# Patient Record
Sex: Male | Born: 1935 | Race: Black or African American | Hispanic: No | Smoking: Never smoker
Health system: Southern US, Community
[De-identification: ages and names within clinical notes are randomized; demographics above are authoritative.]

## PROBLEM LIST (undated history)

## (undated) DIAGNOSIS — I2510 Atherosclerotic heart disease of native coronary artery without angina pectoris: Secondary | ICD-10-CM

## (undated) DIAGNOSIS — D329 Benign neoplasm of meninges, unspecified: Secondary | ICD-10-CM

## (undated) DIAGNOSIS — A048 Other specified bacterial intestinal infections: Secondary | ICD-10-CM

## (undated) DIAGNOSIS — B356 Tinea cruris: Secondary | ICD-10-CM

## (undated) DIAGNOSIS — K219 Gastro-esophageal reflux disease without esophagitis: Secondary | ICD-10-CM

## (undated) DIAGNOSIS — M47812 Spondylosis without myelopathy or radiculopathy, cervical region: Secondary | ICD-10-CM

## (undated) DIAGNOSIS — N289 Disorder of kidney and ureter, unspecified: Secondary | ICD-10-CM

## (undated) DIAGNOSIS — R55 Syncope and collapse: Secondary | ICD-10-CM

## (undated) DIAGNOSIS — E785 Hyperlipidemia, unspecified: Secondary | ICD-10-CM

## (undated) DIAGNOSIS — R001 Bradycardia, unspecified: Secondary | ICD-10-CM

## (undated) DIAGNOSIS — K8500 Idiopathic acute pancreatitis without necrosis or infection: Secondary | ICD-10-CM

## (undated) DIAGNOSIS — F1011 Alcohol abuse, in remission: Secondary | ICD-10-CM

## (undated) DIAGNOSIS — K5909 Other constipation: Secondary | ICD-10-CM

## (undated) DIAGNOSIS — I10 Essential (primary) hypertension: Secondary | ICD-10-CM

## (undated) DIAGNOSIS — H4010X0 Unspecified open-angle glaucoma, stage unspecified: Secondary | ICD-10-CM

## (undated) HISTORY — DX: Spondylosis without myelopathy or radiculopathy, cervical region: M47.812

## (undated) HISTORY — DX: Gastro-esophageal reflux disease without esophagitis: K21.9

## (undated) HISTORY — DX: Atherosclerotic heart disease of native coronary artery without angina pectoris: I25.10

## (undated) HISTORY — DX: Idiopathic acute pancreatitis without necrosis or infection: K85.00

## (undated) HISTORY — PX: PACEMAKER INSERTION: SHX728

## (undated) HISTORY — DX: Benign neoplasm of meninges, unspecified: D32.9

## (undated) HISTORY — DX: Disorder of kidney and ureter, unspecified: N28.9

## (undated) HISTORY — DX: Other specified bacterial intestinal infections: A04.8

## (undated) HISTORY — DX: Syncope and collapse: R55

## (undated) HISTORY — PX: CATARACT EXTRACTION: SUR2

## (undated) HISTORY — DX: Alcohol abuse, in remission: F10.11

## (undated) HISTORY — DX: Other constipation: K59.09

## (undated) HISTORY — DX: Hyperlipidemia, unspecified: E78.5

## (undated) HISTORY — DX: Tinea cruris: B35.6

## (undated) HISTORY — DX: Bradycardia, unspecified: R00.1

## (undated) HISTORY — DX: Essential (primary) hypertension: I10

## (undated) HISTORY — DX: Unspecified open-angle glaucoma, stage unspecified: H40.10X0

## (undated) HISTORY — PX: OTHER SURGICAL HISTORY: SHX169

---

## 1998-04-26 ENCOUNTER — Encounter: Admission: RE | Admit: 1998-04-26 | Discharge: 1998-04-26 | Payer: Self-pay | Admitting: Hematology and Oncology

## 1998-05-03 ENCOUNTER — Encounter: Admission: RE | Admit: 1998-05-03 | Discharge: 1998-05-03 | Payer: Self-pay | Admitting: Hematology and Oncology

## 1998-08-03 ENCOUNTER — Encounter: Admission: RE | Admit: 1998-08-03 | Discharge: 1998-08-03 | Payer: Self-pay | Admitting: Internal Medicine

## 1998-09-08 ENCOUNTER — Encounter: Admission: RE | Admit: 1998-09-08 | Discharge: 1998-09-08 | Payer: Self-pay | Admitting: Internal Medicine

## 1998-09-18 ENCOUNTER — Encounter: Admission: RE | Admit: 1998-09-18 | Discharge: 1998-12-17 | Payer: Self-pay | Admitting: *Deleted

## 1999-01-26 ENCOUNTER — Encounter: Admission: RE | Admit: 1999-01-26 | Discharge: 1999-01-26 | Payer: Self-pay | Admitting: Internal Medicine

## 1999-02-04 ENCOUNTER — Ambulatory Visit (HOSPITAL_COMMUNITY): Admission: RE | Admit: 1999-02-04 | Discharge: 1999-02-04 | Payer: Self-pay | Admitting: *Deleted

## 1999-02-04 ENCOUNTER — Encounter: Payer: Self-pay | Admitting: *Deleted

## 1999-03-13 ENCOUNTER — Encounter: Admission: RE | Admit: 1999-03-13 | Discharge: 1999-03-13 | Payer: Self-pay | Admitting: Internal Medicine

## 1999-08-29 ENCOUNTER — Encounter (INDEPENDENT_AMBULATORY_CARE_PROVIDER_SITE_OTHER): Payer: Self-pay | Admitting: Hospitalist

## 1999-08-29 LAB — CONVERTED CEMR LAB: PSA: 0.31 ng/mL

## 1999-09-14 ENCOUNTER — Encounter: Admission: RE | Admit: 1999-09-14 | Discharge: 1999-09-14 | Payer: Self-pay | Admitting: Internal Medicine

## 2000-01-18 ENCOUNTER — Encounter: Admission: RE | Admit: 2000-01-18 | Discharge: 2000-01-18 | Payer: Self-pay | Admitting: Internal Medicine

## 2000-04-08 ENCOUNTER — Encounter: Admission: RE | Admit: 2000-04-08 | Discharge: 2000-04-08 | Payer: Self-pay | Admitting: Hematology and Oncology

## 2000-05-14 ENCOUNTER — Encounter: Payer: Self-pay | Admitting: Emergency Medicine

## 2000-05-14 ENCOUNTER — Encounter: Payer: Self-pay | Admitting: Internal Medicine

## 2000-05-14 ENCOUNTER — Inpatient Hospital Stay (HOSPITAL_COMMUNITY): Admission: EM | Admit: 2000-05-14 | Discharge: 2000-05-16 | Payer: Self-pay | Admitting: Emergency Medicine

## 2000-05-15 ENCOUNTER — Encounter: Payer: Self-pay | Admitting: Internal Medicine

## 2000-05-29 ENCOUNTER — Encounter: Admission: RE | Admit: 2000-05-29 | Discharge: 2000-05-29 | Payer: Self-pay | Admitting: Internal Medicine

## 2000-10-13 ENCOUNTER — Emergency Department (HOSPITAL_COMMUNITY): Admission: EM | Admit: 2000-10-13 | Discharge: 2000-10-13 | Payer: Self-pay | Admitting: Emergency Medicine

## 2001-01-19 ENCOUNTER — Encounter: Admission: RE | Admit: 2001-01-19 | Discharge: 2001-01-19 | Payer: Self-pay | Admitting: Internal Medicine

## 2001-02-23 ENCOUNTER — Encounter: Admission: RE | Admit: 2001-02-23 | Discharge: 2001-02-23 | Payer: Self-pay | Admitting: Internal Medicine

## 2001-06-24 ENCOUNTER — Encounter: Admission: RE | Admit: 2001-06-24 | Discharge: 2001-06-24 | Payer: Self-pay | Admitting: Internal Medicine

## 2001-06-24 ENCOUNTER — Ambulatory Visit (HOSPITAL_COMMUNITY): Admission: RE | Admit: 2001-06-24 | Discharge: 2001-06-24 | Payer: Self-pay | Admitting: Internal Medicine

## 2001-07-08 ENCOUNTER — Ambulatory Visit (HOSPITAL_COMMUNITY): Admission: RE | Admit: 2001-07-08 | Discharge: 2001-07-08 | Payer: Self-pay | Admitting: Internal Medicine

## 2001-07-08 ENCOUNTER — Encounter: Admission: RE | Admit: 2001-07-08 | Discharge: 2001-07-08 | Payer: Self-pay | Admitting: Internal Medicine

## 2001-07-14 ENCOUNTER — Encounter: Admission: RE | Admit: 2001-07-14 | Discharge: 2001-07-14 | Payer: Self-pay | Admitting: Internal Medicine

## 2001-07-22 ENCOUNTER — Ambulatory Visit (HOSPITAL_COMMUNITY): Admission: RE | Admit: 2001-07-22 | Discharge: 2001-07-22 | Payer: Self-pay | Admitting: Internal Medicine

## 2001-07-28 ENCOUNTER — Encounter: Admission: RE | Admit: 2001-07-28 | Discharge: 2001-07-28 | Payer: Self-pay | Admitting: Internal Medicine

## 2001-07-28 ENCOUNTER — Ambulatory Visit (HOSPITAL_COMMUNITY): Admission: RE | Admit: 2001-07-28 | Discharge: 2001-07-28 | Payer: Self-pay | Admitting: Internal Medicine

## 2001-08-11 ENCOUNTER — Encounter: Admission: RE | Admit: 2001-08-11 | Discharge: 2001-08-11 | Payer: Self-pay | Admitting: Internal Medicine

## 2001-11-27 ENCOUNTER — Ambulatory Visit (HOSPITAL_COMMUNITY): Admission: RE | Admit: 2001-11-27 | Discharge: 2001-11-27 | Payer: Self-pay | Admitting: Internal Medicine

## 2001-11-27 ENCOUNTER — Encounter: Admission: RE | Admit: 2001-11-27 | Discharge: 2001-11-27 | Payer: Self-pay

## 2002-02-16 ENCOUNTER — Encounter: Admission: RE | Admit: 2002-02-16 | Discharge: 2002-02-16 | Payer: Self-pay | Admitting: Internal Medicine

## 2002-07-30 ENCOUNTER — Emergency Department (HOSPITAL_COMMUNITY): Admission: EM | Admit: 2002-07-30 | Discharge: 2002-07-30 | Payer: Self-pay | Admitting: Emergency Medicine

## 2002-07-30 ENCOUNTER — Encounter: Payer: Self-pay | Admitting: Emergency Medicine

## 2002-08-03 ENCOUNTER — Encounter: Admission: RE | Admit: 2002-08-03 | Discharge: 2002-08-03 | Payer: Self-pay | Admitting: Internal Medicine

## 2002-08-25 ENCOUNTER — Ambulatory Visit (HOSPITAL_COMMUNITY): Admission: RE | Admit: 2002-08-25 | Discharge: 2002-08-25 | Payer: Self-pay | Admitting: Internal Medicine

## 2002-09-17 ENCOUNTER — Encounter: Admission: RE | Admit: 2002-09-17 | Discharge: 2002-09-17 | Payer: Self-pay | Admitting: Internal Medicine

## 2002-12-24 ENCOUNTER — Encounter: Admission: RE | Admit: 2002-12-24 | Discharge: 2002-12-24 | Payer: Self-pay | Admitting: Internal Medicine

## 2003-02-26 ENCOUNTER — Emergency Department (HOSPITAL_COMMUNITY): Admission: EM | Admit: 2003-02-26 | Discharge: 2003-02-26 | Payer: Self-pay | Admitting: Emergency Medicine

## 2003-02-26 ENCOUNTER — Encounter: Payer: Self-pay | Admitting: Emergency Medicine

## 2003-03-22 ENCOUNTER — Encounter: Admission: RE | Admit: 2003-03-22 | Discharge: 2003-03-22 | Payer: Self-pay | Admitting: Internal Medicine

## 2003-08-10 ENCOUNTER — Encounter: Admission: RE | Admit: 2003-08-10 | Discharge: 2003-08-10 | Payer: Self-pay | Admitting: Internal Medicine

## 2003-08-10 ENCOUNTER — Ambulatory Visit (HOSPITAL_COMMUNITY): Admission: RE | Admit: 2003-08-10 | Discharge: 2003-08-10 | Payer: Self-pay | Admitting: Internal Medicine

## 2003-11-08 ENCOUNTER — Encounter: Admission: RE | Admit: 2003-11-08 | Discharge: 2003-11-08 | Payer: Self-pay | Admitting: Internal Medicine

## 2004-02-09 ENCOUNTER — Encounter: Admission: RE | Admit: 2004-02-09 | Discharge: 2004-02-09 | Payer: Self-pay | Admitting: Internal Medicine

## 2004-02-13 ENCOUNTER — Encounter: Admission: RE | Admit: 2004-02-13 | Discharge: 2004-02-13 | Payer: Self-pay | Admitting: Internal Medicine

## 2004-02-29 ENCOUNTER — Encounter: Admission: RE | Admit: 2004-02-29 | Discharge: 2004-02-29 | Payer: Self-pay | Admitting: Internal Medicine

## 2004-07-12 ENCOUNTER — Ambulatory Visit: Payer: Self-pay | Admitting: Internal Medicine

## 2004-11-08 ENCOUNTER — Ambulatory Visit: Payer: Self-pay | Admitting: Internal Medicine

## 2005-05-08 ENCOUNTER — Ambulatory Visit: Payer: Self-pay | Admitting: Hospitalist

## 2005-05-14 ENCOUNTER — Ambulatory Visit: Payer: Self-pay | Admitting: Internal Medicine

## 2005-05-15 ENCOUNTER — Emergency Department (HOSPITAL_COMMUNITY): Admission: EM | Admit: 2005-05-15 | Discharge: 2005-05-15 | Payer: Self-pay | Admitting: Emergency Medicine

## 2005-06-20 ENCOUNTER — Ambulatory Visit: Payer: Self-pay | Admitting: Hospitalist

## 2005-07-02 ENCOUNTER — Ambulatory Visit: Payer: Self-pay | Admitting: Internal Medicine

## 2005-08-14 ENCOUNTER — Ambulatory Visit: Payer: Self-pay | Admitting: Hospitalist

## 2006-01-14 ENCOUNTER — Ambulatory Visit: Payer: Self-pay | Admitting: Hospitalist

## 2006-04-02 ENCOUNTER — Ambulatory Visit: Payer: Self-pay | Admitting: Hospitalist

## 2006-05-16 ENCOUNTER — Emergency Department (HOSPITAL_COMMUNITY): Admission: EM | Admit: 2006-05-16 | Discharge: 2006-05-17 | Payer: Self-pay | Admitting: Emergency Medicine

## 2006-06-05 ENCOUNTER — Ambulatory Visit: Payer: Self-pay | Admitting: Hospitalist

## 2006-06-05 ENCOUNTER — Ambulatory Visit (HOSPITAL_COMMUNITY): Admission: RE | Admit: 2006-06-05 | Discharge: 2006-06-05 | Payer: Self-pay | Admitting: Hospitalist

## 2006-06-30 ENCOUNTER — Ambulatory Visit: Payer: Self-pay | Admitting: Internal Medicine

## 2006-07-09 ENCOUNTER — Ambulatory Visit: Payer: Self-pay

## 2006-07-09 ENCOUNTER — Ambulatory Visit: Payer: Self-pay | Admitting: Internal Medicine

## 2006-07-21 ENCOUNTER — Ambulatory Visit: Payer: Self-pay | Admitting: Internal Medicine

## 2006-07-28 ENCOUNTER — Encounter: Payer: Self-pay | Admitting: Cardiology

## 2006-07-28 ENCOUNTER — Ambulatory Visit: Payer: Self-pay

## 2006-08-27 ENCOUNTER — Encounter (INDEPENDENT_AMBULATORY_CARE_PROVIDER_SITE_OTHER): Payer: Self-pay | Admitting: Hospitalist

## 2006-08-27 DIAGNOSIS — E785 Hyperlipidemia, unspecified: Secondary | ICD-10-CM | POA: Insufficient documentation

## 2006-08-27 DIAGNOSIS — R001 Bradycardia, unspecified: Secondary | ICD-10-CM | POA: Insufficient documentation

## 2006-08-27 DIAGNOSIS — M5030 Other cervical disc degeneration, unspecified cervical region: Secondary | ICD-10-CM | POA: Insufficient documentation

## 2006-08-27 DIAGNOSIS — H409 Unspecified glaucoma: Secondary | ICD-10-CM | POA: Insufficient documentation

## 2006-08-27 DIAGNOSIS — B356 Tinea cruris: Secondary | ICD-10-CM | POA: Insufficient documentation

## 2006-08-27 DIAGNOSIS — K5900 Constipation, unspecified: Secondary | ICD-10-CM | POA: Insufficient documentation

## 2006-08-27 DIAGNOSIS — K219 Gastro-esophageal reflux disease without esophagitis: Secondary | ICD-10-CM | POA: Insufficient documentation

## 2006-08-27 DIAGNOSIS — I10 Essential (primary) hypertension: Secondary | ICD-10-CM | POA: Insufficient documentation

## 2006-10-02 ENCOUNTER — Ambulatory Visit: Payer: Self-pay | Admitting: Hospitalist

## 2006-10-02 LAB — CONVERTED CEMR LAB
AST: 23 units/L (ref 0–37)
Albumin: 4.2 g/dL (ref 3.5–5.2)
Calcium: 10 mg/dL (ref 8.4–10.5)
Chloride: 102 meq/L (ref 96–112)
Cholesterol: 195 mg/dL (ref 0–200)
LDL Cholesterol: 112 mg/dL — ABNORMAL HIGH (ref 0–99)
Total Bilirubin: 0.6 mg/dL (ref 0.3–1.2)
Total CHOL/HDL Ratio: 3
Triglycerides: 87 mg/dL (ref <150)
VLDL: 17 mg/dL (ref 0–40)

## 2006-11-24 DIAGNOSIS — N259 Disorder resulting from impaired renal tubular function, unspecified: Secondary | ICD-10-CM | POA: Insufficient documentation

## 2007-01-02 ENCOUNTER — Ambulatory Visit: Payer: Self-pay | Admitting: Internal Medicine

## 2007-01-02 ENCOUNTER — Encounter (INDEPENDENT_AMBULATORY_CARE_PROVIDER_SITE_OTHER): Payer: Self-pay | Admitting: Internal Medicine

## 2007-01-20 ENCOUNTER — Encounter (INDEPENDENT_AMBULATORY_CARE_PROVIDER_SITE_OTHER): Payer: Self-pay | Admitting: Hospitalist

## 2007-01-29 ENCOUNTER — Ambulatory Visit: Payer: Self-pay | Admitting: Hospitalist

## 2007-01-29 DIAGNOSIS — R9409 Abnormal results of other function studies of central nervous system: Secondary | ICD-10-CM

## 2007-01-29 LAB — CONVERTED CEMR LAB
AST: 27 units/L (ref 0–37)
BUN: 14 mg/dL (ref 6–23)
Chloride: 104 meq/L (ref 96–112)
Creatinine, Ser: 1.18 mg/dL (ref 0.40–1.50)
Glucose, Bld: 99 mg/dL (ref 70–99)
HDL: 51 mg/dL (ref >39)
Sodium: 139 meq/L (ref 135–145)
Total Bilirubin: 0.6 mg/dL (ref 0.3–1.2)
Total Protein: 8.2 g/dL (ref 6.0–8.3)

## 2007-04-20 ENCOUNTER — Emergency Department (HOSPITAL_COMMUNITY): Admission: EM | Admit: 2007-04-20 | Discharge: 2007-04-20 | Payer: Self-pay | Admitting: Emergency Medicine

## 2007-05-01 ENCOUNTER — Encounter (INDEPENDENT_AMBULATORY_CARE_PROVIDER_SITE_OTHER): Payer: Self-pay | Admitting: Hospitalist

## 2007-05-04 ENCOUNTER — Encounter (INDEPENDENT_AMBULATORY_CARE_PROVIDER_SITE_OTHER): Payer: Self-pay | Admitting: Hospitalist

## 2007-05-05 ENCOUNTER — Telehealth: Payer: Self-pay | Admitting: *Deleted

## 2007-05-26 ENCOUNTER — Ambulatory Visit: Payer: Self-pay | Admitting: Hospitalist

## 2007-05-26 DIAGNOSIS — M25559 Pain in unspecified hip: Secondary | ICD-10-CM

## 2007-05-26 DIAGNOSIS — D320 Benign neoplasm of cerebral meninges: Secondary | ICD-10-CM | POA: Insufficient documentation

## 2007-06-09 ENCOUNTER — Ambulatory Visit (HOSPITAL_COMMUNITY): Admission: RE | Admit: 2007-06-09 | Discharge: 2007-06-09 | Payer: Self-pay | Admitting: *Deleted

## 2007-06-09 ENCOUNTER — Ambulatory Visit: Payer: Self-pay | Admitting: *Deleted

## 2007-06-09 LAB — CONVERTED CEMR LAB: HDL goal, serum: 40 mg/dL

## 2007-07-23 ENCOUNTER — Ambulatory Visit: Payer: Self-pay | Admitting: Hospitalist

## 2007-07-29 ENCOUNTER — Encounter (INDEPENDENT_AMBULATORY_CARE_PROVIDER_SITE_OTHER): Payer: Self-pay | Admitting: Hospitalist

## 2007-08-10 ENCOUNTER — Ambulatory Visit: Payer: Self-pay | Admitting: Internal Medicine

## 2007-08-10 ENCOUNTER — Ambulatory Visit: Payer: Self-pay

## 2007-08-10 ENCOUNTER — Encounter: Payer: Self-pay | Admitting: Internal Medicine

## 2007-09-02 ENCOUNTER — Encounter (INDEPENDENT_AMBULATORY_CARE_PROVIDER_SITE_OTHER): Payer: Self-pay | Admitting: Hospitalist

## 2007-09-05 ENCOUNTER — Ambulatory Visit: Payer: Self-pay | Admitting: Cardiology

## 2007-09-05 ENCOUNTER — Observation Stay (HOSPITAL_COMMUNITY): Admission: EM | Admit: 2007-09-05 | Discharge: 2007-09-06 | Payer: Self-pay | Admitting: Cardiology

## 2007-09-13 ENCOUNTER — Emergency Department (HOSPITAL_COMMUNITY): Admission: EM | Admit: 2007-09-13 | Discharge: 2007-09-13 | Payer: Self-pay | Admitting: Emergency Medicine

## 2007-09-14 ENCOUNTER — Emergency Department (HOSPITAL_COMMUNITY): Admission: EM | Admit: 2007-09-14 | Discharge: 2007-09-14 | Payer: Self-pay | Admitting: Emergency Medicine

## 2007-09-15 DIAGNOSIS — K279 Peptic ulcer, site unspecified, unspecified as acute or chronic, without hemorrhage or perforation: Secondary | ICD-10-CM | POA: Insufficient documentation

## 2007-09-24 ENCOUNTER — Ambulatory Visit: Payer: Self-pay | Admitting: Hospitalist

## 2007-09-25 ENCOUNTER — Ambulatory Visit: Payer: Self-pay | Admitting: Internal Medicine

## 2007-09-25 ENCOUNTER — Encounter (INDEPENDENT_AMBULATORY_CARE_PROVIDER_SITE_OTHER): Payer: Self-pay | Admitting: Hospitalist

## 2007-12-16 ENCOUNTER — Encounter (INDEPENDENT_AMBULATORY_CARE_PROVIDER_SITE_OTHER): Payer: Self-pay | Admitting: Hospitalist

## 2007-12-20 ENCOUNTER — Emergency Department (HOSPITAL_COMMUNITY): Admission: EM | Admit: 2007-12-20 | Discharge: 2007-12-20 | Payer: Self-pay | Admitting: Emergency Medicine

## 2008-01-01 ENCOUNTER — Ambulatory Visit: Payer: Self-pay | Admitting: Internal Medicine

## 2008-01-01 LAB — CONVERTED CEMR LAB
Basophils Absolute: 0 10*3/uL (ref 0.0–0.1)
CO2: 26 meq/L (ref 19–32)
Calcium: 9.3 mg/dL (ref 8.4–10.5)
Chloride: 103 meq/L (ref 96–112)
Creatinine, Ser: 1 mg/dL (ref 0.4–1.5)
Eosinophils Absolute: 0 10*3/uL (ref 0.0–0.6)
GFR calc Af Amer: 95 mL/min
GFR calc non Af Amer: 79 mL/min
Glucose, Bld: 111 mg/dL — ABNORMAL HIGH (ref 70–99)
HCT: 42.3 % (ref 39.0–52.0)
Hemoglobin: 14.4 g/dL (ref 13.0–17.0)
Lymphocytes Relative: 42.5 % (ref 12.0–46.0)
MCV: 96.9 fL (ref 78.0–100.0)
Monocytes Absolute: 0.4 10*3/uL (ref 0.2–0.7)
Monocytes Relative: 8 % (ref 3.0–11.0)
Neutro Abs: 2.8 10*3/uL (ref 1.4–7.7)
Neutrophils Relative %: 47.9 % (ref 43.0–77.0)
Potassium: 3.4 meq/L — ABNORMAL LOW (ref 3.5–5.1)
RBC: 4.36 M/uL (ref 4.22–5.81)
Sodium: 137 meq/L (ref 135–145)
Total CK: 256 units/L (ref 7–195)
WBC: 5.5 10*3/uL (ref 4.5–10.5)

## 2008-01-05 ENCOUNTER — Emergency Department (HOSPITAL_COMMUNITY): Admission: EM | Admit: 2008-01-05 | Discharge: 2008-01-05 | Payer: Self-pay | Admitting: Emergency Medicine

## 2008-01-13 ENCOUNTER — Ambulatory Visit: Payer: Self-pay | Admitting: Internal Medicine

## 2008-01-14 ENCOUNTER — Ambulatory Visit: Payer: Self-pay | Admitting: Hospitalist

## 2008-01-14 LAB — CONVERTED CEMR LAB
ALT: 16 units/L (ref 0–53)
Calcium: 9.6 mg/dL (ref 8.4–10.5)
Chloride: 102 meq/L (ref 96–112)
Cholesterol: 212 mg/dL — ABNORMAL HIGH (ref 0–200)
Total Bilirubin: 0.6 mg/dL (ref 0.3–1.2)
Total Protein: 7.4 g/dL (ref 6.0–8.3)
VLDL: 13 mg/dL (ref 0–40)

## 2008-03-02 ENCOUNTER — Ambulatory Visit: Payer: Self-pay | Admitting: Cardiovascular Disease

## 2008-03-14 ENCOUNTER — Telehealth: Payer: Self-pay | Admitting: *Deleted

## 2008-03-23 ENCOUNTER — Ambulatory Visit: Payer: Self-pay

## 2008-03-25 ENCOUNTER — Ambulatory Visit: Payer: Self-pay | Admitting: Internal Medicine

## 2008-04-04 ENCOUNTER — Ambulatory Visit: Payer: Self-pay | Admitting: Hospitalist

## 2008-04-04 DIAGNOSIS — R3911 Hesitancy of micturition: Secondary | ICD-10-CM

## 2008-04-04 DIAGNOSIS — M549 Dorsalgia, unspecified: Secondary | ICD-10-CM | POA: Insufficient documentation

## 2008-04-04 LAB — CONVERTED CEMR LAB
Ketones, ur: NEGATIVE mg/dL
PSA: 0.5 ng/mL (ref 0.10–4.00)
Protein, ur: NEGATIVE mg/dL
Urine Glucose: NEGATIVE mg/dL
Urobilinogen, UA: 0.2 (ref 0.0–1.0)
pH: 6 (ref 5.0–8.0)

## 2008-05-14 ENCOUNTER — Emergency Department (HOSPITAL_COMMUNITY): Admission: EM | Admit: 2008-05-14 | Discharge: 2008-05-14 | Payer: Self-pay | Admitting: Emergency Medicine

## 2008-05-17 ENCOUNTER — Inpatient Hospital Stay (HOSPITAL_COMMUNITY): Admission: RE | Admit: 2008-05-17 | Discharge: 2008-05-20 | Payer: Self-pay | Admitting: Orthopedic Surgery

## 2008-07-20 ENCOUNTER — Ambulatory Visit: Payer: Self-pay | Admitting: Internal Medicine

## 2008-08-23 ENCOUNTER — Ambulatory Visit: Payer: Self-pay | Admitting: Internal Medicine

## 2008-09-12 ENCOUNTER — Ambulatory Visit: Payer: Self-pay | Admitting: Internal Medicine

## 2008-09-14 ENCOUNTER — Encounter: Admission: RE | Admit: 2008-09-14 | Discharge: 2008-09-14 | Payer: Self-pay | Admitting: Internal Medicine

## 2008-11-10 ENCOUNTER — Ambulatory Visit: Payer: Self-pay | Admitting: Internal Medicine

## 2008-11-25 ENCOUNTER — Ambulatory Visit: Payer: Self-pay | Admitting: Internal Medicine

## 2008-11-25 LAB — CONVERTED CEMR LAB: Total CK: 169 units/L (ref 7–195)

## 2009-04-06 ENCOUNTER — Encounter: Payer: Self-pay | Admitting: Internal Medicine

## 2009-04-06 ENCOUNTER — Ambulatory Visit: Payer: Self-pay | Admitting: Internal Medicine

## 2009-04-07 ENCOUNTER — Telehealth: Payer: Self-pay | Admitting: Internal Medicine

## 2009-04-07 ENCOUNTER — Ambulatory Visit: Payer: Self-pay | Admitting: Internal Medicine

## 2009-04-11 ENCOUNTER — Telehealth: Payer: Self-pay | Admitting: Internal Medicine

## 2009-07-19 ENCOUNTER — Encounter: Payer: Self-pay | Admitting: Internal Medicine

## 2009-07-21 ENCOUNTER — Telehealth: Payer: Self-pay | Admitting: Internal Medicine

## 2009-08-07 ENCOUNTER — Encounter: Payer: Self-pay | Admitting: Internal Medicine

## 2009-10-06 ENCOUNTER — Ambulatory Visit: Payer: Self-pay | Admitting: Internal Medicine

## 2010-02-25 ENCOUNTER — Emergency Department (HOSPITAL_COMMUNITY): Admission: EM | Admit: 2010-02-25 | Discharge: 2010-02-26 | Payer: Self-pay | Admitting: Emergency Medicine

## 2010-04-19 ENCOUNTER — Emergency Department (HOSPITAL_COMMUNITY)
Admission: EM | Admit: 2010-04-19 | Discharge: 2010-04-19 | Payer: Self-pay | Source: Home / Self Care | Admitting: Emergency Medicine

## 2010-06-07 ENCOUNTER — Encounter (INDEPENDENT_AMBULATORY_CARE_PROVIDER_SITE_OTHER): Payer: Self-pay | Admitting: Otolaryngology

## 2010-06-07 ENCOUNTER — Ambulatory Visit (HOSPITAL_COMMUNITY): Admission: RE | Admit: 2010-06-07 | Discharge: 2010-06-07 | Payer: Self-pay | Admitting: Otolaryngology

## 2010-08-03 ENCOUNTER — Ambulatory Visit: Payer: Self-pay | Admitting: Internal Medicine

## 2010-10-11 ENCOUNTER — Telehealth: Payer: Self-pay | Admitting: Internal Medicine

## 2010-10-12 ENCOUNTER — Telehealth: Payer: Self-pay | Admitting: Internal Medicine

## 2010-11-07 ENCOUNTER — Telehealth: Payer: Self-pay | Admitting: Internal Medicine

## 2010-11-16 ENCOUNTER — Telehealth: Payer: Self-pay | Admitting: Internal Medicine

## 2010-11-30 ENCOUNTER — Encounter
Admission: RE | Admit: 2010-11-30 | Discharge: 2010-11-30 | Payer: Self-pay | Source: Home / Self Care | Attending: Physical Medicine and Rehabilitation | Admitting: Physical Medicine and Rehabilitation

## 2010-11-30 ENCOUNTER — Ambulatory Visit
Admission: RE | Admit: 2010-11-30 | Discharge: 2010-11-30 | Payer: Self-pay | Source: Home / Self Care | Attending: Internal Medicine | Admitting: Internal Medicine

## 2010-11-30 ENCOUNTER — Encounter: Payer: Self-pay | Admitting: Internal Medicine

## 2010-11-30 LAB — CONVERTED CEMR LAB
BUN: 18 mg/dL (ref 6–23)
Basophils Relative: 0 % (ref 0–1)
CO2: 27 meq/L (ref 19–32)
Calcium: 9.5 mg/dL (ref 8.4–10.5)
Chloride: 98 meq/L (ref 96–112)
Creatinine, Ser: 1.06 mg/dL (ref 0.40–1.50)
Eosinophils Absolute: 0 10*3/uL (ref 0.0–0.7)
Eosinophils Relative: 0 % (ref 0–5)
HCT: 40.3 % (ref 39.0–52.0)
Lymphs Abs: 2.8 10*3/uL (ref 0.7–4.0)
MCHC: 35.5 g/dL (ref 30.0–36.0)
MCV: 88 fL (ref 78.0–100.0)
Monocytes Relative: 5 % (ref 3–12)
Platelets: 203 10*3/uL (ref 150–400)
Prothrombin Time: 13.4 s (ref 11.6–15.2)
WBC: 11.7 10*3/uL — ABNORMAL HIGH (ref 4.0–10.5)

## 2010-12-03 ENCOUNTER — Inpatient Hospital Stay (HOSPITAL_COMMUNITY)
Admission: RE | Admit: 2010-12-03 | Discharge: 2010-12-04 | Payer: Self-pay | Source: Home / Self Care | Attending: Cardiovascular Disease | Admitting: Cardiovascular Disease

## 2010-12-06 NOTE — Letter (Signed)
Summary: Cardiac Catheterization Instructions- Main Lab  [employer], Main Office  [address]   [place], [place] [address]   Phone: [phone]  Fax: [phone]     11/30/2010 MRN: [id number]  Antonio Le [address] [place], [place]  [address]  Dear Antonio Le   You are scheduled for Cardiac Catheterization on  December 03, 2010             with Dr. Excell Seltzer.              Please arrive at the [facility] at 8:30      a.m.Marland Kitchen on the day of your procedure.  1. DIET     __X__ Nothing to eat or drink after midnight except your medications with a sip of water.  2. Come to the [place] office on (done today)            for lab work.  The lab at [facility] is open from 8:30 a.m. to 1:30 p.m. and 2:30 p.m. to 5:00 p.m.  The lab at [facility] is open from 7:30 a.m. to 5:30 p.m.  You do not have to be fasting.  3. MAKE SURE YOU TAKE YOUR ASPIRIN.  4.      YOU MAY TAKE ALL of your remaining medications with a small amount of water.  5. Plan for one night stay - bring personal belongings (i.e. toothpaste, toothbrush, etc.)  6. Bring a current list of your medications and current insurance cards.  7. Must have a responsible person to drive you home.   8. Someone must be with you for the first 24 hours after you arrive home.  9. Please wear clothes that are easy to get on and off and wear slip-on shoes.  *Special note: Every effort is made to have your procedure done on time.  Occasionally there are emergencies that present themselves at the hospital that may cause delays.  Please be patient if a delay does occur.  If you have any questions after you get home, please call the office at the number listed above.  Dossie Arbour, RN, BSN

## 2010-12-06 NOTE — Progress Notes (Signed)
Summary: pt feeling weak and tired when walking  Phone Note Call from Patient Call back at Home Phone [phone]   Caller: Patient Summary of Call: Pt feeling weak and tired when walking Initial call taken by: Judie Grieve,  November 16, 2010 2:16 PM  Follow-up for Phone Call        pt adv that he feels like his legs are going to give out on him and chest feels light. stated that last week he was out walking and felt very weak. he is on a fast with his church for a month-no meats or sweets. he is eating 3 meals daily. stated he feels good physically. pt also stated he lost 10 pounds last week ++. adv him I set him up to see Dr. Tenny Craw on 1/27 at 2:15 and that if Dr. Tenny Craw felt he needed to be seen sooner, we would call him. Pt expressed understanding.  Follow-up by: Claris Gladden RN,  November 16, 2010 2:59 PM

## 2010-12-06 NOTE — Procedures (Signed)
  NAMEMINAS, BONSER NO.:  [id number]  MEDICAL RECORD NO.:  [id number]          PATIENT TYPE:  INP  LOCATION:  2922                         FACILITY:  [facility]  PHYSICIAN:  Noralyn Pick. Eden Emms, MD, FACCDATE OF BIRTH:  [birth year]  DATE OF PROCEDURE: DATE OF DISCHARGE:                           CARDIAC CATHETERIZATION   A 76 year old patient referred for chest pain.  Prior to the catheterization, the patient was noted to be incomplete heart block at a rate of 32 beats per minute.  He apparently had previously refused to have a pacemaker.  I was reluctant to do an left ventriculogram and crossed the valve with such a low heart rate and incomplete heart block and I was also reluctant to do the case without placing a venous sheath; therefore, a 5-French venous sheath was placed as well as a 5-French arterial sheath.  Left main coronary artery was normal.  Left anterior descending artery was normal.  Distal vessel was small. The first diagonal branch was normal.  Second diagonal branch was normal.  Circumflex coronary artery was nondominant.  There was a large obtuse marginal branch, which was normal.  The AV groove branch had a 40% ostial lesion.  The right coronary artery was dominant, it was normal.  The posterolateral branches were small in caliber.  IMPRESSION:  The patient's chest pain is undoubtedly being caused by slow heart rate, incomplete heart block.  He has no significant coronary artery disease.  I talked to the patient at length and told him I would be very reluctant for him to leave since he was scheduled as an outpatient and after much discussion, he consented to having a permanent pacemaker placed.  I discussed the case with Dr. Graciela Husbands who will try to place his permanent pacemaker later today.  The patient tolerated his diagnostic heart cath well.     Noralyn Pick. Eden Emms, MD, [facility]     PCN/MEDQ  D:  12/03/2010  T:  12/04/2010  Job:   [id number]  Electronically Signed by Charlton Haws MD [facility] on 12/06/2010 10:38:40 PM

## 2010-12-06 NOTE — Assessment & Plan Note (Signed)
Summary: 9 month rov./sl   Visit Type:  Follow-up Primary Provider:  Clerance Lav MD  CC:  no cardiac complaints continues to walk every am  from 5-7 am.  History of Present Illness: Pateint is a 76 year old with a history of bradycardia, abnormal ECG, dyslipidemia, hypertension.  I saw him in clinic in December of last year. Since seen he has done well.  He denies dizziness, no chest pain.  no SOB.  He remains very active.  This AM he walked 6 mles.  Current Medications (verified): 1)  Hydrochlorothiazide 25 Mg Tabs (Hydrochlorothiazide) .... Take 1 Tablet By Mouth Once Daily. 2)  Aspirin 81 Mg Tabs (Aspirin) .... Take 1 Tablet By Mouth Once Daily. 3)  Percocet 10-325 Mg  Tabs (Oxycodone-Acetaminophen) .... Take One Tablet Every 6 Hours As Needed For Pain 4)  Prilosec Otc 20 Mg  Tbec (Omeprazole Magnesium) .... Take 1 Tablet By Mouth Two Times A Day 5)  Kaon-Cl-10 10 Meq  Tbcr (Potassium Chloride) .... Take 1 Tablet By Mouth Once A Day 6)  Crestor 10 Mg Tabs (Rosuvastatin Calcium) .... Take 1 Tablet By Mouth Once A Day 7)  Nystatin-Triamcinolone 100000-0.1 Unit/gm-% Crea (Nystatin-Triamcinolone) .... Apply To Affected Area As Needed 8)  Klonopin 0.5 Mg Tabs (Clonazepam) .... As Needed 9)  Coq-10 30 Mg Caps (Coenzyme Q10) .... Daily 10)  Eq Vegetable Laxative 8.6 Mg Tabs (Sennosides) .... Daily  Allergies: No Known Drug Allergies  Past History:  Past medical, surgical, family and social histories (including risk factors) reviewed, and no changes noted (except as noted below).  Past Medical History: Reviewed history from 10/06/2009 and no changes required. Question coronary artery disease- s/p silent MI [place] in [facility] w/ no records  Normal myoview 2007 Mild Assymetric Septal Hypertrophy (?Hypertrophic Cardiomyopathy?) -  EF55-65%, Mod increased LV wall echo 9/02 -  Mild Diastolic Dysfunction (2D Echo 42/59) - EF 55-65%  Bradycardia -  nl TSH 8/06 -   Cardiologist Dr. Tenny Craw since 10/04; w/ neg CL and EF 54% 10/04 -  Holter Moniter-9/07 (36-128 bpm. Avg 56, longest pause 2.7 sec) -  Negative Myoview- 9/07 Syncopal episodes, 11/08 - Negative inpt w/u by [facility] Cardiology Hyperlipidemia Hypertension- LVH echo/EKG 9/02; 10/04 Intracranial menigioma R optic nerve dx'd 6/08 at [facility], No surgery needed - h/o asymmetric cup/disc ratio seen by optho Elmer Picker), MRI confirmed - Referral to [facility]entry Roch 3/08, [facility] optho - Also has asymmetry b/n optic nerve (?previous optic neuropathy [i.e. optic neuritis])-inactive Pancreatitis, hx of acute 2001 MCHS, etiology not clear DDD with Degenerative Spondylosis -  Cervical DDD, C3-4 spur w/ osteophytes at C6-7 -  Congenital fusion C4-5 -  Small disc hernations C2-4,C7, T1; C5-7 herniations w/ spinal stenosis MRI 4/00 -  s/p Rehab neck pain 11/99 Chronic Constipation? -  GI referral to Dr. Idelle Leech 3/07. Both cancelled for various reasons -  Lactulose/ Simethicone (gas also) has worked in past Glaucoma, chronic open angle -  Dr. Mateo Flow Remote h/o [facility]. Nothing in 30 yrs and drank heavily for 10 yrs.  Renal insufficiency- BL Cr 1.2 (11/08)  Tinea cruris/ tinea pedis Remote inguinal hernia repair, yr unknown GERD H. Pylori PUD- Rx'd 11/ 08  Past Surgical History: Reviewed history from 10/06/2009 and no changes required. 05/17/08 left hip replacement Right anke surgery Cataract surgery  Family History: Reviewed history from 05/26/2007 and no changes required. Family History Diabetes 1st degree relative Mom Died at age 7 from complications of DM Dad died  at age 69 of "old age"  Social History: Reviewed history from 09/24/2007 and no changes required. He is from [place], [place] and moved to [place] for 32 years. Moved to [place] in 1995. Married for over 40 yrs and lives with his wife. He has 3 children. 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 He has Medicaid? and [employer]/ [employer]. He has children ineveral different states in the [place]. He has a child in [place]. He also has a child in [place]. Pt has mentioned that he is considering moving to [place] to be closer to his children. He is retired and lives with his wife. She has had several knee surgeries, and has some other medical issues. For this reason, they are not able to exercise together.  Alcohol use-no History of use >30 yrs ago. Heavy use for about 10 of those years.  Never Smoked  Review of Systems       ALl systems reviewed.  Negatvie to the above problem  Vital Signs:  Patient profile:   76 year old male Height:      64 inches Weight:      174.4 pounds BMI:     30.04 Pulse rate:   39 / minute Pulse rhythm:   regular BP sitting:   118 / 78  (left arm) Cuff size:   regular  Vitals Entered By: Scherrie Bateman, LPN (August 03, 2010 9:10 AM)  Physical Exam  Additional Exam:  Patient is in NAD. HEENT:  Normocephalic, atraumatic. EOMI, PERRLA.  Neck: JVP is normal. No thyromegaly. No bruits.  Lungs: clear to auscultation. No rales no wheezes.  Heart: Regular rate and rhythm. Normal S1, S2. No S3.   No significant murmurs. PMI not displaced.  Abdomen:  Supple, nontender. Normal bowel sounds. No masses. No hepatomegaly.  Extremities:   Good distal pulses throughout. No lower extremity edema.  Musculoskeletal :moving all extremities.  Neuro:   alert and oriented x3.    EKG  Procedure date:  08/03/2010  Findings:      sinus bradycardia.  39 bpm.  LVH with repolarizaion abnormality.    Impression & Recommendations:  Problem # 1:  Hx of BRADYCARDIA (ICD-427.89) Remains asymptomatic  Remains acitve.  Will continue to follow.  Problem # 2:  HYPERTENSION (ICD-401.9) Keep on same regimen.  Problem # 3:  HYPERLIPIDEMIA (ICD-272.4) Keep on statin. His updated medication list for this problem includes:    Crestor 10 Mg Tabs  (Rosuvastatin calcium) .Marland Kitchen... Take 1 tablet by mouth once a day  Other Orders: EKG w/ Interpretation (93000)

## 2010-12-06 NOTE — Progress Notes (Signed)
Summary: need to verify medication  Phone Note From Pharmacy Call back at [phone]   Caller: [facility]. #[id number]* Summary of Call: Pharmacy need to verify medication Initial call taken by: Judie Grieve,  November 07, 2010 10:57 AM    Additional Follow-up for Phone Call Additional follow up Details #2::    pharamcy does not see where there is a question about his kdur Follow-up by: Burnett Kanaris, CNA,  November 07, 2010 1:40 PM

## 2010-12-06 NOTE — Progress Notes (Signed)
Summary: pt has med question  Phone Note Call from Patient   Caller: Patient [phone] Reason for Call: Talk to Nurse, Privacy/Consent Authorization Summary of Call: pt has questions re medication  Initial call taken by: Glynda Jaeger,  October 12, 2010 2:18 PM  Follow-up for Phone Call        Patient called to ask if he could start taking Limbrel again and I advised him to check with Dr.Pang since this a medication for arthritis. He states that he has an appointment with him this month and will discuss with him  Layne Benton, RN, BSN  October 12, 2010 4:31 PM

## 2010-12-06 NOTE — Progress Notes (Signed)
Summary: pt rtn call from yesterday  Phone Note Call from Patient Call back at [phone]   Caller: Patient Reason for Call: Talk to Nurse, Talk to Doctor Summary of Call: pt rtn call from yesterday to discuss medication Initial call taken by: Omer Jack,  October 11, 2010 10:40 AM  Follow-up for Phone Call        Pt will follow-up with Dr. Ilda Foil about Librium and dosage. Mylo Red RN

## 2010-12-07 ENCOUNTER — Telehealth: Payer: Self-pay | Admitting: Internal Medicine

## 2010-12-10 ENCOUNTER — Telehealth: Payer: Self-pay | Admitting: Internal Medicine

## 2010-12-12 NOTE — Assessment & Plan Note (Signed)
Summary: feel weak in legs-not able to walk 3 miles in am.   Visit Type:  Follow-up Primary Provider:  Clerance Lav MD  CC:  feeling weak, sob, and fatigued.  History of Present Illness: Patient is a 76 year old with a history of bradycardia, dyslipidemia.  I saw him in September. He presents today on follow up.  He says  that recently he has noted chest tightness with walking.  He is a regular walker and says that recently if he walks his normal pace he will get tightness.  This eases off when he stops.  Does note fatigue.  Denies dizzines.  Denies syncope.  No PND.  Current Medications (verified): 1)  Hydrochlorothiazide 25 Mg Tabs (Hydrochlorothiazide) .... Take 1 Tablet By Mouth Once Daily. 2)  Aspirin 81 Mg Tabs (Aspirin) .... Take 1 Tablet By Mouth Once Daily. 3)  Percocet 10-325 Mg  Tabs (Oxycodone-Acetaminophen) .... Take One Tablet Every 6 Hours As Needed For Pain 4)  Prilosec Otc 20 Mg  Tbec (Omeprazole Magnesium) .... Take 1 Tablet By Mouth Two Times A Day 5)  Potassium Chloride Crys Cr 20 Meq Cr-Tabs (Potassium Chloride Crys Cr) .... Take One Tablet By Mouth Daily 6)  Crestor 10 Mg Tabs (Rosuvastatin Calcium) .... Take 1 Tablet By Mouth Once A Day 7)  Nystatin-Triamcinolone 100000-0.1 Unit/gm-% Crea (Nystatin-Triamcinolone) .... Apply To Affected Area As Needed 8)  Klonopin 0.5 Mg Tabs (Clonazepam) .... As Needed 9)  Coq-10 30 Mg Caps (Coenzyme Q10) .... Daily 10)  Eq Vegetable Laxative 8.6 Mg Tabs (Sennosides) .... Daily 11)  Limbrel 500 Mg Caps (Flavocoxid) .... Two Times A Day 12)  Mucus Relief 400 Mg Tabs (Guaifenesin) .... As Needed 13)  Vitamin D3 2000 Unit Caps (Cholecalciferol) .... Daily 14)  Enablex 15 Mg Xr24h-Tab (Darifenacin Hydrobromide) .... Once Daily 15)  Flomax 0.4 Mg Caps (Tamsulosin Hcl) .... Take 1 Cap At Bedtime  Allergies (verified): No Known Drug Allergies  Past History:  Past Medical History: Last updated: 10/06/2009 Question coronary artery  disease- s/p silent MI [place] in [facility] w/ no records  Normal myoview 2007 Mild Assymetric Septal Hypertrophy (?Hypertrophic Cardiomyopathy?) -  EF55-65%, Mod increased LV wall echo 9/02 -  Mild Diastolic Dysfunction (2D Echo 84/13) - EF 55-65%  Bradycardia -  nl TSH 8/06 -  Cardiologist Dr. Tenny Craw since 10/04; w/ neg CL and EF 54% 10/04 -  Holter Moniter-9/07 (36-128 bpm. Avg 56, longest pause 2.7 sec) -  Negative Myoview- 9/07 Syncopal episodes, 11/08 - Negative inpt w/u by [facility] Cardiology Hyperlipidemia Hypertension- LVH echo/EKG 9/02; 10/04 Intracranial menigioma R optic nerve dx'd 6/08 at [facility], No surgery needed - h/o asymmetric cup/disc ratio seen by optho Elmer Picker), MRI confirmed - Referral to Gentry Roch 3/08, [facility] optho - Also has asymmetry b/n optic nerve (?previous optic neuropathy [i.e. optic neuritis])-inactive Pancreatitis, hx of acute 2001 [facility]CHS, etiology not clear DDD with Degenerative Spondylosis -  Cervical DDD, C3-4 spur w/ osteophytes at C6-7 -  Congenital fusion C4-5 -  Small disc hernations C2-4,C7, T1; C5-7 herniations w/ spinal stenosis MRI 4/00 -  s/p Rehab neck pain 11/99 Chronic Constipation? -  GI referral to Dr. Idelle Leech 3/07. Both cancelled for various reasons -  Lactulose/ Simethicone (gas also) has worked in past Glaucoma, chronic open angle -  Dr. Mateo Flow Remote h/o [facility] Abuse. Nothing in 30 yrs and drank heavily for 10 yrs.  Renal insufficiency- BL Cr 1.2 (11/08)  Tinea cruris/ tinea pedis Remote inguinal  hernia repair, yr unknown GERD H. Pylori PUD- Rx'd 11/ 08  Past Surgical History: Last updated: 10/06/2009 05/17/08 left hip replacement Right anke surgery Cataract surgery  Family History: Last updated: 05/26/2007 Family History Diabetes 1st degree relative Mom Died at age 54 from complications of DM Dad died at age 60 of "old age"  Social History: Last updated: 11/30/2010 He is from [place], [place] and moved to [place] for 32 years. Moved to [place] in 1995. Married for over 40 yrs and lives with his wife. He has 3 children. 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 Heavy use for about 10 of those years.  Never Smoked  Social History: He is from [place], [place] and moved to [place] for 32 years. Moved to [place] in 1995. Married for over 40 yrs and lives with his wife. He has 3 children. 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 Heavy use for about 10 of those years.  Never Smoked  Review of Systems       All systems reviewed.  Neg to the above problme except as noted above.  Vital Signs:  Patient profile:   76 year old male Height:      64 inches Weight:      161.75 pounds BMI:     27.86 Pulse rate:   48 / minute BP sitting:   120 / 64  (left arm) Cuff size:   regular  Vitals Entered By: Caralee Ates CMA (November 30, 2010 1:57 PM)  Physical Exam  Additional Exam:  Patinet is in NAD HEENT:  Normocephalic, atraumatic. EOMI, PERRLA.  Neck: JVP is normal. No thyromegaly. No bruits.  Lungs: clear to auscultation. No rales no wheezes.  Heart: Regular rate and rhythm. Normal S1, S2. No S3.   No significant murmurs. PMI not displaced.  Abdomen:  Supple, nontender. Normal bowel sounds. No masses. No hepatomegaly.  Extremities:   Good distal pulses throughout. No lower extremity edema.  Musculoskeletal :moving all extremities.  Neuro:   alert and oriented x3.    EKG  Procedure date:  11/30/2010  Findings:      Sinus rhythm with Mobitz I and juncional escape (high degree AV block)  Impression & Recommendations:  Problem # 1:  Hx of BRADYCARDIA (ICD-427.89) Patient's symptoms are concerning.  May be because of worsening bradycardia.  Need to exclude CAD I have discussed  this and reviewed ECGs with J Allred.  Would recomm LHC to rule out CAD.  Also sched echo. If cath negative  woulr recommend PPM.  Patient willing to undergo cath.  Very reluctant to undergo pacer placement as he said he had 8 friends who died after pacer placed.  Reassured him that we would go one step at a time. Precath labs.  Plan for Monday.  Problem # 2:  HYPERTENSION (ICD-401.9) Follow His updated medication list for this problem includes:    Hydrochlorothiazide 25 Mg Tabs (Hydrochlorothiazide) .Marland Kitchen... Take 1 tablet by mouth once daily.    Aspirin 81 Mg Tabs (Aspirin) .Marland Kitchen... Take 1 tablet by mouth once daily.  Orders: EKG w/ Interpretation (93000)  Problem # 3:  HYPERLIPIDEMIA (ICD-272.4) Continue His updated medication list for this problem includes:    Crestor 10 Mg Tabs (Rosuvastatin calcium) .Marland Kitchen... Take 1  tablet by mouth once a day  Other Orders: T-Basic Metabolic Panel [phone]) T-CBC w/Diff [phone]) T-Protime, Auto [phone]) Cardiac Catheterization (Cardiac Cath)  Patient Instructions: 1)  Your physician recommends that you schedule a follow-up appointment in: Will determine after catheterization. 2)  Your physician recommends that you continue on your current medications as directed. Please refer to the Current Medication list given to you today. 3)  Your physician has requested that you have a cardiac catheterization.  Cardiac catheterization is used to diagnose and/or treat various heart conditions. Doctors may recommend this procedure for a number of different reasons. The most common reason is to evaluate chest pain. Chest pain can be a symptom of coronary artery disease (CAD), and cardiac catheterization can show whether plaque is narrowing or blocking your heart's arteries. This procedure is also used to evaluate the valves, as well as measure the blood flow and oxygen levels in different parts of your heart.  For further information please visit  [email or web address]  Please follow instruction sheet, as given.

## 2010-12-12 NOTE — Progress Notes (Signed)
Summary: want to know when he take a shower  Phone Note Call from Patient Call back at Home Phone [phone]   Caller: Patient Summary of Call: Pt want to know when he can take a shower Initial call taken by: Judie Grieve,  December 07, 2010 2:02 PM  Follow-up for Phone Call        spoke w/pt and advised when to take shower. informed pt to take off big bandage but leave steri strips on unless they fall off. pt aware of wound check on 12-13-10. Vella Kohler  December 07, 2010 2:27 PM

## 2010-12-13 ENCOUNTER — Encounter: Payer: Self-pay | Admitting: Internal Medicine

## 2010-12-13 ENCOUNTER — Ambulatory Visit (INDEPENDENT_AMBULATORY_CARE_PROVIDER_SITE_OTHER): Payer: Medicare Other

## 2010-12-13 DIAGNOSIS — I495 Sick sinus syndrome: Secondary | ICD-10-CM

## 2010-12-17 ENCOUNTER — Ambulatory Visit: Payer: Self-pay

## 2010-12-20 NOTE — Progress Notes (Signed)
Summary: pt wants to know how/soon he can walk  Phone Note Call from Patient   Caller: Patient [phone] Reason for Call: Talk to Nurse Summary of Call: pt wants to start walking -how soon and for how long? Initial call taken by: Glynda Jaeger,  December 10, 2010 1:47 PM  Follow-up for Phone Call        Pt. would like to know if he can walk autside the house. Pt. states he has been walking in home. He was used to walk a few miles a day prior the device placement. I let pt. know that  Dr. Graciela Husbands recommends for him, to increase activity slowly. RN advice  pt. to  use his own judgment. Listen to his body. Pt. verbalized understanding. Follow-up by: Ollen Gross, RN, BSN,  December 10, 2010 3:41 PM

## 2010-12-24 NOTE — Discharge Summary (Signed)
Antonio Le, Antonio Le NO.:  [id number]  MEDICAL RECORD NO.:  [id number]          PATIENT TYPE:  INP  LOCATION:  2922                         FACILITY:  [facility]  PHYSICIAN:  Antonio Salvia, MD, FACCDATE OF BIRTH:  04/17/1935  DATE OF ADMISSION:  12/03/2010 DATE OF DISCHARGE:  12/04/2010                              DISCHARGE SUMMARY   PRIMARY CARE PHYSICIAN:  Clerance Lav, MD PhD  PRIMARY CARDIOLOGIST:  Pricilla Riffle, MD, [facility]  ELECTROPHYSIOLOGIST:  Antonio Salvia, MD, [facility]  PRIMARY DIAGNOSIS:  Complete heart block.  SECONDARY DIAGNOSES: 1. Bradycardia. 2. Hypertension. 3. Hyperlipidemia. 4. 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 5. Degenerative joint disease with chronic back pain. 6. History of pancreatitis. 7. Glaucoma. 8. Gastroesophageal reflux disease. 9. Open angled glaucoma.  ALLERGIES:  The patient has no known drug allergies.  PROCEDURES THIS ADMISSION: 1. Cardiac catheterization by Dr. Eden Emms on December 03, 2010, which     demonstrated no significant coronary artery disease. 2. Implantation of a dual-chamber pacemaker by Dr. Graciela Husbands on December 03, 2010.  The patient received a Medtronic REVO pacemaker model     number [id number] with model number [id number] right atrial and right     ventricular lead.  This is an MRI compatible pacemaker.  The     patient had no early apparent complications. 3. Chest x-ray on December 04, 2010, demonstrated no pneumothorax,     status post pacemaker implant.  BRIEF HISTORY OF PRESENT ILLNESS:  Antonio Le is a 76 year old male with a history of bradycardia and dyslipidemia.  He is followed by Dr. Dietrich Pates in the outpatient setting.  Recently, he has noticed chest tightness with walking.  He is a regular walker, walking up to 3-6 miles per day and says that in the recent weeks, if he walks with normal pace, he will get chest tightness that resolved with rest. He  was referred for diagnostic cardiac catheterization to rule out coronary artery disease.  HOSPITAL COURSE:  The patient presented on December 03, 2010, for planned left heart catheterization.  When he arrived, he was found to be in complete heart block with a junctional escape in the 30s.  Cardiac catheterization was undertaken by Dr. Eden Emms, which demonstrated no significant coronary disease.  Because of the patient's complete heart block, he was evaluated by Dr. Graciela Husbands who recommended pacemaker implantation.  An MRI compatible device was placed because of potential need for future MRI.  The patient was monitored on telemetry overnight which demonstrated P-synchronous pacing at 50 beats per minute.  His left chest was without hematoma or ecchymosis.  His groin site was without bruit or hematoma.  The patient was evaluated by Dr. Graciela Husbands on December 04, 2010, considered stable for discharge.  DISCHARGE INSTRUCTIONS: 1. Increase activity slowly. 2. No driving for 1 week. 3. Follow low-sodium, heart-healthy diet. 4. See supplemental device discharge instructions for wound care and     mobility. 5. Keep the incisions clean and dry.  FOLLOWUP APPOINTMENT: 1. [facility] on December 14, 2010, at 9:40 a.m. 2. Dr. Pricilla Riffle, MD, [facility] on January 07, 2011, at 8:15 a.m. 3. Dr. Graciela Husbands in April 2012. 4. Dr. Sherryll Burger as scheduled.  DISCHARGE MEDICATIONS: 1. Hydrochlorothiazide 25 mg daily. 2. Crestor 10 mg daily. 3. Aspirin 81 mg daily. 4. Clonazepam 0.5 mg twice daily as needed for anxiety. 5. Coenzyme Q10 daily. 6. Enablex 15 mg daily. 7. Klor-Con 10 mEq daily. 8. Limbrel 500 mg twice daily. 9. Mucinex 600 mg daily as needed for cough. 10.Omeprazole 40 mg daily. 11.Flomax 0.4 mg daily. 12.Vitamin D 1000 units daily.  DISPOSITION:  The patient was seen and examined by Dr. Graciela Husbands on December 04, 2010, considered stable for discharge.  DURATION OF DISCHARGE ENCOUNTER:  Thirty-five  minutes.     Gypsy Balsam, RN,BSN   ______________________________ Antonio Salvia, MD, [facility]    AS/MEDQ  D:  12/04/2010  T:  12/05/2010  Job:  [id number]  cc:   Pricilla Riffle, MD, [facility] Clerance Lav, MD PhD  Electronically Signed by Gypsy Balsam RNBSN on 12/06/2010 08:55:16 AM Electronically Signed by Sherryl Manges MD [facility] on 12/24/2010 09:55:30 PM

## 2010-12-24 NOTE — Op Note (Signed)
  NAMEJELAN, BATTERTON NO.:  [id number]  MEDICAL RECORD NO.:  [id number]          PATIENT TYPE:  INP  LOCATION:  2922                         FACILITY:  [facility]  PHYSICIAN:  Duke Salvia, MD, FACCDATE OF BIRTH:  1934-11-26  DATE OF PROCEDURE:  12/03/2010 DATE OF DISCHARGE:                              OPERATIVE REPORT   PREOPERATIVE DIAGNOSIS:  Complete heart block.  POSTOPERATIVE DIAGNOSIS:  Complete heart block.  PROCEDURE:  Dual-chamber pacemaker implantation.  Following obtaining informed consent, the patient was brought to the electrophysiology laboratory and placed on the fluoroscopic table in supine position.  After routine prep and drape of the left upper chest, lidocaine was infiltrated in the subclavicular region.  Incision was made and carried down to the layer of the prepectoral fascia.  Using sharp dissection and electrocautery, a pocket was formed similarly. Hemostasis was obtained.  Thereafter attention was turned to gain an access to the extrathoracic left subclavian vein which was accomplished with moderate difficulty but without the aspiration of air or puncture of the arteries. Subsequently, two separate venipunctures were accomplished.  Guidewires were placed and retained.  An 8-French sheath was placed and through this was passed a Medtronic 5086 MRI compatible lead, serial number [id number] and a 5086 45-cm lead, serial number [id number].  Underlying capitalized ventricular lead was marked with a tie.  The leads were manipulated in right ventricular apex and the right atrial appendage respectively where bipolar R-wave was 11.2 with a pace impedance of 1038 and threshold of 1.2 volts at 0.5 milliseconds. Current threshold is 1.6 mA.  There is no diaphragmatic pacing at 10 volts, the current of injury was brisk.  The bipolar P-wave was 2.8 with a pace impedance of 1334 ohms and a threshold of 0.7 volts at 0.5 milliseconds.  Current  threshold is 2.1 mA.  There is no diaphragmatic pacing at 10 volts.  The current of injury was brisk.  These leads were secured to the prepectoral fascia and then attached to a Medtronic Revo pulse generator, serial number [id number].  Ventricular pacing and P-synchronous pacing were identified.  The pocket was copiously irrigated with antibiotic containing saline solution.  Hemostasis was assured.  Leads and the pulse generator were placed in the pocket and secured to the prepectoral fascia.  The wound was then closed in three layers in normal fashion. The wound was washed, dried, and a benzoin and Steri-Strip dressing was applied.  Needle counts, sponge counts, and instrument counts were correct at the end of the procedure according to staff.  The patient tolerated the procedure without apparent complication.     Duke Salvia, MD, [facility]     SCK/MEDQ  D:  12/03/2010  T:  12/04/2010  Job:  [id number]  Electronically Signed by Sherryl Manges MD [facility] on 12/24/2010 09:55:41 PM

## 2010-12-24 NOTE — Consult Note (Signed)
Antonio Le, [name]:  [id number]  MEDICAL RECORD NO.:  [id number]          PATIENT TYPE:  INP  LOCATION:  2922                         FACILITY:  [facility]  PHYSICIAN:  Duke Salvia, MD, FACCDATE OF BIRTH:  Feb 24, 1935  DATE OF CONSULTATION:  12/03/2010 DATE OF DISCHARGE:                                CONSULTATION   Thank you very much for asking Antonio Le to see Antonio Le in consultation because of bradycardia.  HISTORY OF PRESENT ILLNESS:  He is a 76 year old gentleman with a complex past medical history including a history of an MI, although no records are available.  On cath today, he shows normal coronary arteries, mid septal asymmetric hypertrophy with normal left ventricular function and bradycardia which has been noted for a long time.  He has had series of stress tests over the years with peak heart rates in the 120s.  He walks 3-6 miles a day.  He has had at least one syncopal episode.  He has also had a number of episodes where he "blanks out" but does not fall down.  He presents today for catheterization because of exertional chest discomfort occurring while walking.  He has had some increasing fatigue but denies more lightheadedness.  His exercise- induced chest pain was relieved by rest.  He was admitted for catheterization as noted above and this was normal.  Last week, he was noted to be bradycardic at 50.  He had sinus bradycardia and the electrocardiogram one that is available describes Mobitz I heart block and junctional escape.  EKGs in the [facility] were reviewed back to 2010.  They demonstrated initially sinus rhythm with first-degree AV block in 2010 and 2011.  He had more significant bradycardia with heart rates in the high 30s low 40s with isorhythmic dissociation with junctional rhythm.  Today, he has a complete heart block.  PAST MEDICAL HISTORY: 1. 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 2. Degenerative joint disease with chronic pain in his low back. 3. History of pancreatitis. 4. Hypertension. 5. Dyslipidemia. 6. Glaucoma. 7. GE reflux disease. 8. Open angled glaucoma.  PAST SURGICAL HISTORY: 1. Hernia repair. 2. Ankle surgery. 3. Hip replacement. 4. Cataract surgery.  FAMILY HISTORY:  Negative for coronary artery disease.  SOCIAL HISTORY:  He is from [facility].  He moved to [place] about 15 years ago.  He is married.  He does not use cigarettes, alcohol, or recreational drugs.  CURRENT MEDICATIONS:  Hydrochlorothiazide, aspirin, Percocet, Prilosec, potassium, Crestor, Klonopin, Limbrel, and Flomax.  ALLERGIES:  He has no known drug allergies.  REVIEW OF SYSTEMS:  Negative apart from what is described previously.  PHYSICAL EXAMINATION:  VITAL SIGNS:  His blood pressure was 153/56, his pulse was 35, and his respirations were 18. HEENT:  Normal. NECK:  He was wearing a neck collar. BACK:  His back was not examined because he just had his catheterization. LUNGS:  Clear laterally. HEART:  Sounds were very slow but regular. ABDOMEN:  Soft with active bowel sounds. PULSES:  Intact. MUSCULOSKELETAL:  Grossly normal. SKIN:  Warm and dry. NEUROLOGIC:  Also grossly normal.  He is alert and oriented.  IMPRESSION: 1. Complete heart block with a junctional escape. 2. Exertional chest discomfort, possibly related to chronotropic     incompetence and septal hypertrophy. 3. Intracranial meningioma without surgery recommended, confirmed by     MRI scanning, question need for ongoing MRI scanning. 4. Degenerative joint disease with pain in his back.  Mr. Antonio Le has complete heart block and significant bradycardia.  It is appropriate given the progression of his disease to anticipate pacing. I have discussed with him the potential benefits as well as risks including but not limited to infection, lead dislodgement, and death. We have discussed  the issues of MRI as well as cell phones.  We will plan to proceed with MRI compatible pacing given the history of meningioma and lower back issues which may require ongoing evaluation in the future.     Duke Salvia, MD, [facility]     SCK/MEDQ  D:  12/03/2010  T:  12/04/2010  Job:  [id number]  Electronically Signed by Sherryl Manges MD [facility] on 12/24/2010 09:55:36 PM

## 2010-12-26 ENCOUNTER — Telehealth (INDEPENDENT_AMBULATORY_CARE_PROVIDER_SITE_OTHER): Payer: Self-pay | Admitting: *Deleted

## 2010-12-26 NOTE — Procedures (Signed)
Summary: pacer wound check per amber/sl   Current Medications (verified): 1)  Hydrochlorothiazide 25 Mg Tabs (Hydrochlorothiazide) .... Take 1 Tablet By Mouth Once Daily. 2)  Aspirin 81 Mg Tabs (Aspirin) .... Take 1 Tablet By Mouth Once Daily. 3)  Percocet 10-325 Mg  Tabs (Oxycodone-Acetaminophen) .... Take One Tablet Every 6 Hours As Needed For Pain 4)  Prilosec Otc 20 Mg  Tbec (Omeprazole Magnesium) .... Take 1 Tablet By Mouth Two Times A Day 5)  Potassium Chloride Crys Cr 20 Meq Cr-Tabs (Potassium Chloride Crys Cr) .... Take One Tablet By Mouth Daily 6)  Crestor 10 Mg Tabs (Rosuvastatin Calcium) .... Take 1 Tablet By Mouth Once A Day 7)  Nystatin-Triamcinolone 100000-0.1 Unit/gm-% Crea (Nystatin-Triamcinolone) .... Apply To Affected Area As Needed 8)  Klonopin 0.5 Mg Tabs (Clonazepam) .... As Needed 9)  Coq-10 30 Mg Caps (Coenzyme Q10) .... Daily 10)  Eq Vegetable Laxative 8.6 Mg Tabs (Sennosides) .... Daily 11)  Limbrel 500 Mg Caps (Flavocoxid) .... Two Times A Day 12)  Mucus Relief 400 Mg Tabs (Guaifenesin) .... As Needed 13)  Vitamin D3 2000 Unit Caps (Cholecalciferol) .... Daily 14)  Enablex 15 Mg Xr24h-Tab (Darifenacin Hydrobromide) .... Once Daily 15)  Flomax 0.4 Mg Caps (Tamsulosin Hcl) .... Take 1 Cap At Bedtime  Allergies (verified): No Known Drug Allergies   PPM Specifications Following MD:  Sherryl Manges, MD     PPM Vendor:  Medtronic     PPM Model Number:  [id number]     PPM DOI:  12/03/2010     PPM Implanting MD:  Sherryl Manges, MD  Lead 1    Location: RA     DOI: 12/03/2010     Model #: 1914     Serial #: [id number]     Status: active Lead 2    Location: RV     DOI: 12/03/2010     Model #: 0865     Serial #: [id number]     Status: active  PPM Follow Up Battery Voltage:  3.06 V       PPM Device Measurements Atrium  Impedance: 504 ohms, Threshold: 1.0 V at 0.4 msec Right Ventricle  Amplitude: 16.5 mV, Impedance: 528 ohms, Threshold: 1.0 V at 0.4 msec  Episodes MS  Episodes:  0     Ventricular High Rate:  0     Atrial Pacing:  54%     Ventricular Pacing:  99.9%  Parameters Mode:  DDDR     Lower Rate Limit:  50     Upper Rate Limit:  130 Paced AV Delay:  180     Sensed AV Delay:  150 Next Cardiology Appt Due:  03/05/2011 Tech Comments:  WOUND CHECK--STERI STRIPS REMOVED. NO REDNESS OR SWELLING AT SITE.  NORMAL DEVICE FUNCTION. NO CHANGES MADE. ROV IN 3 MTHS W/SK. Vella Kohler  December 16, 2010 2:58 PM

## 2011-01-01 ENCOUNTER — Telehealth: Payer: Self-pay | Admitting: Internal Medicine

## 2011-01-01 NOTE — Progress Notes (Signed)
Summary: Pacemaker beeping  Phone Note Call from Patient Call back at Home Phone [phone]   Caller: Patient Summary of Call: Pacemaker beeping but it was not hurting want to talk to someone about the beeping Initial call taken by: Judie Grieve,  December 26, 2010 8:28 AM  Follow-up for Phone Call        spoke w/pt and pt scheduled for check 12-27-10 @ 930.

## 2011-01-07 ENCOUNTER — Encounter: Payer: Self-pay | Admitting: Internal Medicine

## 2011-01-07 ENCOUNTER — Encounter (INDEPENDENT_AMBULATORY_CARE_PROVIDER_SITE_OTHER): Payer: Medicare Other | Admitting: Internal Medicine

## 2011-01-07 DIAGNOSIS — I2510 Atherosclerotic heart disease of native coronary artery without angina pectoris: Secondary | ICD-10-CM

## 2011-01-07 DIAGNOSIS — E7800 Pure hypercholesterolemia, unspecified: Secondary | ICD-10-CM

## 2011-01-10 NOTE — Cardiovascular Report (Signed)
Summary: Office Visit   Office Visit   Imported By: Roderic Ovens 12/31/2010 15:41:22  _____________________________________________________________________  External Attachment:    Type:   Image     Comment:   External Document

## 2011-01-15 NOTE — Assessment & Plan Note (Signed)
Summary: 4-6 week f/u per amber/sl   Primary Provider:  Clerance Lav MD   History of Present Illness: Antonio Le is a 76 year old with a history of bradycardia, hypertenision, dyslipidemia.  I saw him several wks ago and he was in complete heart block.  He underwent cardiac catheterizaion which showed only mild CAD.  He also underwent pacer placement. Since d/c he says his breathing is better.  He denies fever.  Still is sore in his left chest.  Problems Prior to Update: 1)  Other Ill-defined Heart Disease  (ICD-429.89) 2)  Hypertension  (ICD-401.9) 3)  Hyperlipidemia  (ICD-272.4) 4)  Renal Insufficiency  (ICD-588.9) 5)  Back Pain  (ICD-724.5) 6)  Tinea Cruris  (ICD-110.3) 7)  Urinary Hesitancy  (ICD-788.64) 8)  Gerd  (ICD-530.81) 9)  Pud  (ICD-533.90) 10)  Hip Pain, Left  (ICD-719.45) 11)  Mri, Brain, Abnormal  (ICD-794.09) 12)  Meningioma  (ICD-225.2) 13)  Glaucoma  (ICD-365.9) 14)  Constipation  (ICD-564.00) 15)  Hx of Bradycardia  (ICD-427.89) 16)  Degenerative Disc Disease, Cervical Spine  (ICD-722.4)  Current Medications (verified): 1)  Hydrochlorothiazide 25 Mg Tabs (Hydrochlorothiazide) .... Take 1 Tablet By Mouth Once Daily. 2)  Aspirin 81 Mg Tabs (Aspirin) .... Take 1 Tablet By Mouth Once Daily. 3)  Percocet 10-325 Mg  Tabs (Oxycodone-Acetaminophen) .... Take One Tablet Every 6 Hours As Needed For Pain 4)  Omeprazole 40 Mg Cpdr (Omeprazole) .Marland Kitchen.. 1 Tab By Mouth Once Daily 5)  Potassium Chloride Crys Cr 20 Meq Cr-Tabs (Potassium Chloride Crys Cr) .... Not Sure If Hes Taken....take One Tablet By Mouth Daily 6)  Crestor 20 Mg Tabs (Rosuvastatin Calcium) .... 1/2 Tab By Mouth Once Daily 7)  Nystatin-Triamcinolone 100000-0.1 Unit/gm-% Crea (Nystatin-Triamcinolone) .... Apply To Affected Area As Needed 8)  Klonopin 0.5 Mg Tabs (Clonazepam) .... As Needed 9)  Coq-10 30 Mg Caps (Coenzyme Q10) .... Daily 10)  Limbrel 500 Mg Caps (Flavocoxid) .... Two Times A Day 11)  Vitamin D3  2000 Unit Caps (Cholecalciferol) .... Daily 12)  Enablex 15 Mg Xr24h-Tab (Darifenacin Hydrobromide) .... Once Daily 13)  Flomax 0.4 Mg Caps (Tamsulosin Hcl) .... Take 1 Cap At Bedtime  Allergies: No Known Drug Allergies  Past History:  Past medical, surgical, family and social histories (including risk factors) reviewed, and no changes noted (except as noted below).  Past Medical History: Question coronary artery disease- s/p silent MI [place] in [facility] 1990 w/ no records  Normal myoview 2007 Mild CAD by cath 2012 Mild Assymetric Septal Hypertrophy (?Hypertrophic Cardiomyopathy?) -  EF55-65%, Mod increased LV wall echo 9/02 -  Mild Diastolic Dysfunction (2D Echo 14/78) - EF 55-65%  Bradycardia -  nl TSH 8/06 -  Cardiologist Dr. Tenny Craw since 10/04; w/ neg CL and EF 54% 10/04 -  Holter Moniter-9/07 (36-128 bpm. Avg 56, longest pause 2.7 sec) -  Negative Myoview- 9/07 Pacer placement.2012 Syncopal episodes, 11/08 - Negative inpt w/u by [facility] Hyperlipidemia Hypertension- LVH echo/EKG 9/02; 10/04 Intracranial menigioma R optic nerve dx'd 6/08 at [facility], No surgery needed - h/o asymmetric cup/disc ratio seen by optho Elmer Picker), MRI confirmed - Referral to [facility] 3/08, [facility] op[facility]o - Also has asymmetry b/n optic nerve (?previous optic neuropathy [i.e. optic neuritis])-inactive Pancreatitis, hx of acute 2001 [facility], etiology not clear DDD with Degenerative Spondylosis -  Cervical DDD, C3-4 spur w/ osteophytes at C6-7 -  Congenital fusion C4-5 -  Small disc hernations C2-4,C7, T1; C5-7 herniations w/ spinal stenosis MRI 4/00 -  s/p Rehab neck pain 11/99 Chronic Constipation? -  GI referral to Dr. Idelle Leech 3/07. Both cancelled for various reasons -  Lactulose/ Simethicone (gas also) has worked in past Glaucoma, chronic open angle -  Dr. Mateo Flow Remote h/o [facility] Abuse. Nothing in 30 yrs and drank heavily for 10 yrs.  Renal insufficiency- BL Cr  1.2 (11/08)  Tinea cruris/ tinea pedis Remote inguinal hernia repair, yr unknown GERD H. Pylori PUD- Rx'd 11/ 08  Past Surgical History: Reviewed history from 10/06/2009 and no changes required. 05/17/08 left hip replacement Right anke surgery Cataract surgery  Family History: Reviewed history from 05/26/2007 and no changes required. Family History Diabetes 1st degree relative Mom Died at age 38 from complications of DM Dad died at age 14 of "old age"  Social History: Reviewed history from 11/30/2010 and no changes required. He is from [place], [place] and moved to [place] for 32 years. Moved to [place] in 1995. Married for over 40 yrs and lives with his wife. He has 3 children. 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 Heavy use for about 10 of those years.  Never Smoked  Review of Systems       ALl systems reviewed.  Neg to the above problem except as noted above.  Vital Signs:  Patient profile:   76 year old male Height:      64 inches Weight:      165 pounds BMI:     28.42 Pulse rate:   55 / minute Resp:     18 per minute BP sitting:   124 / 72  (left arm)  Vitals Entered By: Kem Parkinson (January 07, 2011 8:34 AM)  Physical Exam  Additional Exam:  Patient is in NAD HEENT:  Normocephalic, atraumatic. EOMI, PERRLA.  Neck: JVP is normal. No thyromegaly. No bruits.  Lungs: clear to auscultation. No rales no wheezes.  chest Pacer site dry, no signif swelling. Heart: Regular rate and rhythm. Normal S1, S2. No S3.   No significant murmurs. PMI not displaced.  Abdomen:  Supple, nontender. Normal bowel sounds. No masses. No hepatomegaly.  Extremities:   Good distal pulses throughout. No lower extremity edema.   L arm a lttle swollen compared to R. Musculoskeletal :moving all extremities.  Neuro:   alert and oriented x3.    EKG  Procedure date:   01/07/2011  Findings:      AV sequential pacer.  PPM Specifications Following MD:  Sherryl Manges, MD     PPM Vendor:  Medtronic     PPM Model Number:  [id number]     PPM DOI:  12/03/2010     PPM Implanting MD:  Sherryl Manges, MD  Lead 1    Location: RA     DOI: 12/03/2010     Model #: 0981     Serial #: [id number]     Status: active Lead 2    Location: RV     DOI: 12/03/2010     Model #: 6213     Serial #: [id number]     Status: active  Parameters Mode:  DDDR     Lower Rate Limit:  50     Upper Rate Limit:  130 Paced AV Delay:  180     Sensed AV Delay:  150  Impression & Recommendations:  Problem # 1:  Hx of BRADYCARDIA (ICD-427.89) Doing well  post pacer.  No longer SOB.  Admits to prior dizziness.  Site clean.  Arm a little swollen.  I asked him to elevate it.  Problem # 2:  HYPERLIPIDEMIA (ICD-272.4) Keep on with mild CAD> His updated medication list for this problem includes:    Crestor 20 Mg Tabs (Rosuvastatin calcium) .Marland Kitchen... 1/2 tab by mouth once daily  Problem # 3:  HYPERTENSION (ICD-401.9) good control  Other Orders: EKG w/ Interpretation (93000)  Patient Instructions: 1)  Your physician recommends that you return for a FASTING lipid profile: lipid panel and ast at primary care doctor 2)  Your physician wants you to follow-up in: 9 months  You will receive a reminder letter in the mail two months in advance. If you don't receive a letter, please call our office to schedule the follow-up appointment.

## 2011-01-15 NOTE — Progress Notes (Signed)
Summary: pt can't lift with his left arm since surgery  Phone Note Call from Patient   Caller: Patient [phone] or  [phone] Reason for Call: Talk to Nurse Summary of Call: when can pt do work arounf the house? says can lift much at all with his left arm- Initial call taken by: Glynda Jaeger,  January 01, 2011 9:51 AM  Follow-up for Phone Call        busy x1 Scherrie Bateman, LPN  January 02, 2011 4:32 PM  PT AWARE MAY LIFT AS MUCH AS 10 POUNDS  ON SIDE DIVICE IS INSERTED  FOR NEXT COUPLE OF MONTHS AND MAY DO OTHER ACITIVITIES AS TOLERATED. PER PT IS ON 3 POUND LIMIT ON LIFTING FOR BACK ISSUES. Follow-up by: Scherrie Bateman, LPN,  January 02, 2011 5:00 PM  Additional Follow-up for Phone Call Additional follow up Details #1::        it s improtant that Mr V know that he is supposed to be going throgh shoulder range of motion even if he is weight restricted thaks steve Additional Follow-up by: Nathen May, MD, [facility],  January 07, 2011 5:00 PM

## 2011-01-18 ENCOUNTER — Telehealth: Payer: Self-pay | Admitting: Internal Medicine

## 2011-01-19 LAB — SURGICAL PCR SCREEN
MRSA, PCR: NEGATIVE
Staphylococcus aureus: NEGATIVE

## 2011-01-19 LAB — BASIC METABOLIC PANEL
BUN: 10 mg/dL (ref 6–23)
Calcium: 9.4 mg/dL (ref 8.4–10.5)
Creatinine, Ser: 1.31 mg/dL (ref 0.4–1.5)
GFR calc non Af Amer: 54 mL/min — ABNORMAL LOW (ref >60)
Glucose, Bld: 115 mg/dL — ABNORMAL HIGH (ref 70–99)

## 2011-01-19 LAB — CBC
MCHC: 34.5 g/dL (ref 30.0–36.0)
Platelets: 116 10*3/uL — ABNORMAL LOW (ref 150–400)
RDW: 13.5 % (ref 11.5–15.5)

## 2011-01-20 LAB — DIFFERENTIAL
Basophils Relative: 0 % (ref 0–1)
Monocytes Absolute: 0.4 10*3/uL (ref 0.1–1.0)
Monocytes Relative: 4 % (ref 3–12)
Neutro Abs: 7 10*3/uL (ref 1.7–7.7)

## 2011-01-20 LAB — CBC
HCT: 47.7 % (ref 39.0–52.0)
Platelets: 117 10*3/uL — ABNORMAL LOW (ref 150–400)
RDW: 13.4 % (ref 11.5–15.5)

## 2011-01-20 LAB — COMPREHENSIVE METABOLIC PANEL
Albumin: 3.6 g/dL (ref 3.5–5.2)
Alkaline Phosphatase: 79 U/L (ref 39–117)
BUN: 6 mg/dL (ref 6–23)
GFR calc Af Amer: >60 mL/min (ref >60)
Potassium: 3.8 mEq/L (ref 3.5–5.1)
Sodium: 140 mEq/L (ref 135–145)
Total Protein: 7.5 g/dL (ref 6.0–8.3)

## 2011-01-22 LAB — URINALYSIS, ROUTINE W REFLEX MICROSCOPIC
Glucose, UA: NEGATIVE mg/dL
Hgb urine dipstick: NEGATIVE
Protein, ur: NEGATIVE mg/dL
Specific Gravity, Urine: 1.013 (ref 1.005–1.030)

## 2011-01-22 LAB — URINE CULTURE
Colony Count: NO GROWTH
Culture: NO GROWTH

## 2011-01-22 LAB — BASIC METABOLIC PANEL
BUN: 10 mg/dL (ref 6–23)
Calcium: 9.7 mg/dL (ref 8.4–10.5)
Creatinine, Ser: 1.28 mg/dL (ref 0.4–1.5)
GFR calc non Af Amer: 55 mL/min — ABNORMAL LOW (ref >60)

## 2011-01-22 LAB — CBC
Platelets: 129 10*3/uL — ABNORMAL LOW (ref 150–400)
WBC: 7.4 10*3/uL (ref 4.0–10.5)

## 2011-01-22 LAB — DIFFERENTIAL
Basophils Absolute: 0 10*3/uL (ref 0.0–0.1)
Eosinophils Absolute: 0.1 10*3/uL (ref 0.0–0.7)
Lymphocytes Relative: 36 % (ref 12–46)
Lymphs Abs: 2.7 10*3/uL (ref 0.7–4.0)
Neutrophils Relative %: 54 % (ref 43–77)

## 2011-01-22 NOTE — Progress Notes (Signed)
Summary: pt calling re neck is drawn up  Phone Note Call from Patient   Caller: Patient [phone] Summary of Call: right side of neck is "drawn up" -is this normal? Initial call taken by: Glynda Jaeger,  January 18, 2011 2:29 PM  Follow-up for Phone Call        Called patient.  A little area by clavicle on other side of chest from pacer.  Antonio Le feels drawn up.  NOt swollen I do not think this is due to his cath or to pacer.  Follow. Follow-up by: Sherrill Raring, MD, [facility],  January 18, 2011 4:46 PM

## 2011-01-24 ENCOUNTER — Emergency Department (HOSPITAL_COMMUNITY)
Admission: EM | Admit: 2011-01-24 | Discharge: 2011-01-24 | Disposition: A | Discharge status: Home / Self Care | Payer: Medicare Other | Attending: Emergency Medicine | Admitting: Emergency Medicine

## 2011-01-24 DIAGNOSIS — Z950 Presence of cardiac pacemaker: Secondary | ICD-10-CM | POA: Insufficient documentation

## 2011-01-24 DIAGNOSIS — M542 Cervicalgia: Secondary | ICD-10-CM | POA: Insufficient documentation

## 2011-01-24 DIAGNOSIS — I10 Essential (primary) hypertension: Secondary | ICD-10-CM | POA: Insufficient documentation

## 2011-01-24 DIAGNOSIS — I2510 Atherosclerotic heart disease of native coronary artery without angina pectoris: Secondary | ICD-10-CM | POA: Insufficient documentation

## 2011-01-31 ENCOUNTER — Telehealth: Payer: Self-pay | Admitting: Internal Medicine

## 2011-01-31 NOTE — Telephone Encounter (Signed)
Pt calls today b/c he has been feeling tired lately.  He denies sob, angina, no pain, no swelling. He is taking all of his medications. He is exercising his left arm per dc pacer instructions. He does complain of constipation and chronic back pain.  Pt is taking his oxycodone anywhere from 4-6 times a day for his back.  Encouraged pt to reduce oxycodone, continue with exercise, and proper diet.  He agrees. Mylo Red RN

## 2011-02-16 ENCOUNTER — Encounter: Payer: Self-pay | Admitting: Internal Medicine

## 2011-03-18 ENCOUNTER — Ambulatory Visit (INDEPENDENT_AMBULATORY_CARE_PROVIDER_SITE_OTHER): Payer: Medicare Other | Admitting: Internal Medicine

## 2011-03-18 ENCOUNTER — Encounter: Payer: Self-pay | Admitting: Internal Medicine

## 2011-03-18 DIAGNOSIS — I498 Other specified cardiac arrhythmias: Secondary | ICD-10-CM

## 2011-03-18 DIAGNOSIS — I442 Atrioventricular block, complete: Secondary | ICD-10-CM

## 2011-03-18 DIAGNOSIS — Z950 Presence of cardiac pacemaker: Secondary | ICD-10-CM

## 2011-03-18 DIAGNOSIS — R079 Chest pain, unspecified: Secondary | ICD-10-CM | POA: Insufficient documentation

## 2011-03-18 DIAGNOSIS — R001 Bradycardia, unspecified: Secondary | ICD-10-CM

## 2011-03-18 NOTE — Assessment & Plan Note (Signed)
Stable post pacing 

## 2011-03-18 NOTE — Assessment & Plan Note (Signed)
The patient's device was interrogated and the information was fully reviewed.  The device was reprogrammed to maximizae longevity

## 2011-03-18 NOTE — Assessment & Plan Note (Signed)
The patient has chronotropic incompetence.Antonio Le He is paced about 80% of the time in the atrium. His exercise tolerance is much improved and exercise monitor his pacemaker confirms that.

## 2011-03-18 NOTE — Patient Instructions (Addendum)
Your physician recommends that you schedule a follow-up appointment for: September 2012 with Dr. Tenny Craw.  Your physician wants you to follow-up in: January 2013 with Dr. Graciela Husbands. You will receive a reminder letter in the mail two months in advance. If you don't receive a letter, please call our office to schedule the follow-up appointment.  Your physician recommends that you continue on your current medications as directed. Please refer to the Current Medication list given to you today.

## 2011-03-18 NOTE — Progress Notes (Signed)
HPI  Antonio Le is a 76 y.o. male Seen in follow for pacemaker implanted for sinus node dysfunction and progressive conduction system disease with ultimate complete heart block implanted January 2012;  His energy levels are much improved. He no longer has chest pain. He does notice however a soreness which seems to track with the amount of activity.  Catheterization January 2012 demonstrated no ischemia. There is no peripheral edema orthopnea.  Past Medical History  Diagnosis Date  . CAD (coronary artery disease)     cath 01/12 no CAD, no records of reported silent MI in [facility], in [place], normal [place] 2007, EF 65%, mild diastolic dysfunction  . 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 Graciela Husbands  . Hyperlipidemia   . Hypertension   . GERD (gastroesophageal reflux disease)   . Meningioma     involves right optic nerve  . Chronic constipation     GI referral to Dr. Randa Evens in the past, pt did not go  . Idiopathic acute pancreatitis   . Syncopal episodes   . Renal insufficiency     creatinine ~1.2 in the past  . H. pylori infection     [employer]  . History of alcohol abuse   . Glaucoma, open angle   . Cervical spondylarthritis   . Tinea cruris     Past Surgical History  Procedure Date  . Left hip replacement   . Cataract extraction     Current Outpatient Prescriptions  Medication Sig Dispense Refill  . aspirin 81 MG tablet Take 81 mg by mouth daily.        . Cholecalciferol (VITAMIN D3) 1000 UNITS tablet Take 1,000 Units by mouth daily.        . clonazePAM (KLONOPIN) 0.5 MG tablet Take 0.5 mg by mouth at bedtime as needed.        . darifenacin (ENABLEX) 15 MG 24 hr tablet Take 15 mg by mouth daily.        . hydrochlorothiazide 25 MG tablet Take 25 mg by mouth daily.        . metFORMIN (GLUCOPHAGE-XR) 500 MG 24 hr tablet Take 500 mg by mouth daily with breakfast.        .  nystatin-triamcinolone (MYCOLOG II) cream Apply 1 application topically as needed.        Marland Kitchen omeprazole (PRILOSEC) 40 MG capsule Take 40 mg by mouth daily.        Marland Kitchen oxyCODONE-acetaminophen (PERCOCET) 10-325 MG per tablet Take 1 tablet by mouth every 6 (six) hours as needed.        . potassium chloride SA (K-DUR,KLOR-CON) 20 MEQ tablet Take 20 mEq by mouth daily.       . rosuvastatin (CRESTOR) 20 MG tablet Take 20 mg by mouth daily.        . Tamsulosin HCl (FLOMAX) 0.4 MG CAPS Take 0.4 mg by mouth daily.        Marland Kitchen co-enzyme Q-10 30 MG capsule Take 30 mg by mouth daily.        . Psyllium (VEGETABLE LAXATIVE PO) Take 1 tablet by mouth daily.        Marland Kitchen DISCONTD: rosuvastatin (CRESTOR) 10 MG tablet Take 10 mg by mouth daily.          No Known Allergies  Review of Systems negative except from HPI and PMH  Physical Exam Well developed and well nourished in  no acute distress HENT normal E scleral and icterus clear Neck Supple JVP flat; carotids brisk and full Clear to ausculation Pacemaker pocket is well healed there is a mild keloid there is no warmth Regular rate and rhythm, no murmurs gallops or rub Soft with active bowel sounds No clubbing cyanosis and edema Alert and oriented, grossly normal motor and sensory function Skin Warm and Dry   Assessment and  Plan

## 2011-03-18 NOTE — Assessment & Plan Note (Signed)
Patient has atypical chest pain. He is known nonobstructive coronary disease. I suspect that this is related to healing. It is somewhat better than it has been. We will continue to follow it. There is no evidence at this point of infection.

## 2011-03-19 ENCOUNTER — Encounter: Payer: Medicare Other | Admitting: Internal Medicine

## 2011-03-19 NOTE — Assessment & Plan Note (Signed)
[facility]                            CARDIOLOGY OFFICE NOTE   NAME:Antonio Le, Antonio Le                      MRN:          [id number]  DATE:01/01/2008                            DOB:          05-20-1935    IDENTIFICATION:  Mr. Mutschler is a 76 year old gentleman.  I last saw him  back in November.  He is status post MI (reported silent) in 71.  However, Myoview scan in 2004 showed no ischemia or scar.   When I saw him last he had a spell of dizziness that he felt related to  a GI medicine.   Since seen, he notes this constant chest pain on the left side, worse  with walking, has not gone away since November.  He also notes  occasional dizziness, near syncope, but no syncope.   Note he has been seen by Dr. Randa Evens in GI and had an EGD x2, reportedly  had an ulcer that improved.   CURRENT MEDICATIONS:  1. Aspirin 81.  2. Lipitor 80.  3. Hydrochlorothiazide 25.  4. Omeprazole 20 b.i.d.  5. Lactulose solution daily.   PHYSICAL EXAM:  The patient is in no distress.  Blood pressure is  142/78, pulse is 41 and regular, weight 177, up 6 pounds from November.  LUNGS:  Clear.  CHEST:  Tender on palpation in the left parasternal region.  This brings  on the pain he has been experiencing.  CARDIAC EXAM:  Regular rate and rhythm.  S1, S2.  No S3, no murmurs.  ABDOMEN:  Benign.  EXTREMITIES:  No edema.   IMPRESSION:  1. Chest pain, appears to be musculoskeletal.  The patient was told by      Dr. Ethelene Hal he had extensive arthritis.  I will go ahead and send a      copy of this report to him.  I would check an ESR and ANA today.  2. Dizziness and near syncope.  The patient has a resting bradycardia      that is known.  I will set him up for a Holter monitor.  He has had      one in the past.  I am concerned that he may be going a little      slower.  Will also check a CBC and electrolytes.   In addition, will add a CK to his labs.     Pricilla Riffle, MD,  [facility]  Electronically Signed    PVR/MedQ  DD: 01/01/2008  DT: 01/03/2008  Job #: [id number]   cc:   Caralyn Guile. Ethelene Hal, M.D.  Chauncey Reading, D.O.

## 2011-03-19 NOTE — Discharge Summary (Signed)
Antonio Le, KAI NO.:  [id number]   MEDICAL RECORD NO.:  [id number]          PATIENT TYPE:  INP   LOCATION:  4735                         FACILITY:  [facility]   PHYSICIAN:  Madolyn Frieze. Jens Som, MD, FACCDATE OF BIRTH:  22-Nov-1934   DATE OF ADMISSION:  09/05/2007  DATE OF DISCHARGE:  09/06/2007                               DISCHARGE SUMMARY   DISCHARGE DIAGNOSIS:  1. Syncopal episode.  2. Atypical chest pain.  3. Normal left ventriculogram function.  4. Hypertension.  5. Hypercholesteremia.  6. Right carotid bruit.  7. Questionable history of cocaine use.   PAST MEDICAL HISTORY INCLUDES:  1. Silent MI in 1999. Questionable tachybrady syndrome.  The patient      states he were Holter monitor in the past.  2. Hypertension.  3. Dyslipidemia.  4. Abnormal EKG questionable history of hypertrophic cardiomyopathy.  5. Status post Myoview in 2004 which was negative.  6. Per patient's report chronic back pain.  7. Glaucoma.  8. Recent 2-D echocardiogram done October 2008 showed an EF of 60%,      trivial MR, mild diastolic dysfunction.   HOSPITAL COURSE:  Mr. Llanas is a 76 year old patient of Dr. Dietrich Pates  and Dr. Okey Dupre for primary care.  She presented to [facility]  Emergency Room initially complaining of chest discomfort.  During the  night the patient was transported over to [facility] as a direct admit  for our services.  He complained of intermittent sharp chest pain under  his left breast, not associated with any active or position.  He also  complained of brief episodes of presyncope with seconds of, blacking  out.  The patient was admitted to telemetry services.  He ruled out for  a myocardial infarction.  The patient later recanted, historian states  he did not have any frank syncope; he just felt dizzy for 1-2 seconds.  The patient followed on monitor.  No clear indication for pacemaker at  this time.  No ventricular arrhythmias noted.  The patient  has been  instructed; however, that he cannot drive until he follows up with Dr.  Tenny Craw for further evaluation.   On day of discharge blood pressure stable 156/77, BUN and creatinine 12  and 1.05, H&H 14.3 and 41.8.  EKG showing sinus brady with a first-  degree block with LVH with early repolarization.  Patient being  discharged home with instructions to continue his previous medications.  Followup with Dr. Tenny Craw for further evaluation, and no driving until  cleared by Dr. Tenny Craw.  Consider outpatient Myoview at Dr. Charlott Rakes  discretion.   MEDICATIONS AT DISCHARGE INCLUDE:  1. HCTZ 25.  2. Lipitor 80.  3. Aspirin 81.   Our office will call the patient in the a.m. to inform him of date and  time for follow-up appointment with Dr. Tenny Craw.   DURATION OF DISCHARGE ENCOUNTER:  Greater than 30 minutes.      Dorian Pod, ACNP      Madolyn Frieze. Jens Som, MD, [facility]  Electronically Signed    MB/MEDQ  D:  09/06/2007  T:  09/07/2007  Job:  [id number]   cc:   Dr. Okey Dupre

## 2011-03-19 NOTE — H&P (Signed)
Antonio Le, DWAN NO.:  [id number]   MEDICAL RECORD NO.:  [id number]          PATIENT TYPE:  INP   LOCATION:  4735                         FACILITY:  [facility]   PHYSICIAN:  Madolyn Frieze. Jens Som, MD, FACCDATE OF BIRTH:  04/14/1935   DATE OF ADMISSION:  09/05/2007  DATE OF DISCHARGE:  09/06/2007                              HISTORY & PHYSICAL   Mr. Parenteau is a 76 year old patient of Dr. Huston Foley with past medical  history of hypertension, hyperlipidemia and bradycardia whom we are  asked to evaluate for chest pain and syncope.  The patient has a  questionable history of silent myocardial infarctions in the past in [place].  However, I do have an note from Dr. Tenny Craw on January 02, 2007.  Apparently a Myoview scan in 2004 showed no ischemia or scar.  There is  also question of hypertrophic cardiomyopathy.  His most recent  echocardiogram was performed on August 10, 2007.  This showed normal LV  function.  There was mild LVH and mild asymmetric septal hypertrophy.  There was trivial mitral regurgitation.  The patient typically does not  have significant dyspnea on exertion, orthopnea, PND, palpitations or  exertional chest pain.  Over the last several days, he has had  occasional brief pain in his left chest described as sharp sensation  lasting seconds.  However, last evening he developed a soreness in the  left upper chest.  It did not radiate.  It was not pleuritic but did  increase with certain movements.  There was no associated nausea,  vomiting, shortness of breath or diaphoresis.  Also, while watching TV,  he had two frank syncopal episodes.  He states that he blacked out for  approximately 1-2 seconds.  There were no associated palpitations,  shortness of breath, chest pain or nausea or vomiting.  There are no  neurological symptoms.  He went to [facility] emergency room and is now  asymptomatic.   PRESENT MEDICATIONS:  1. Hydrochlorothiazide 25 mg p.o.  daily.  2. Lipitor 80 mg daily.  3. Aspirin 81 mg daily.   ALLERGIES:  He has no known drug allergies.   SOCIAL HISTORY:  He does not smoke nor does he consume alcohol.   FAMILY HISTORY:  Is negative for coronary artery disease.   PAST MEDICAL HISTORY:  Significant for hypertension as well as  hyperlipidemia, but there is no diabetes mellitus.  He has a history of  bradycardia stating his heart rate runs in the 30s at times.  He has had  previous umbilical hernia repair, and he has glaucoma.   REVIEW OF SYSTEMS:  He denies any headaches, fevers, or chills.  There  is no productive cough or hemoptysis.  There is no dysphagia,  odynophagia, melena or hematochezia.  There is no dysuria or hematuria.  He denies seizure activity.  There is no orthopnea, PND or pedal edema.  He does occasionally have a drawing sensation in his left abdomen.  He  apparently recently underwent colonoscopy this, but I do not have those  records available.  Remaining systems are negative.  PHYSICAL EXAMINATION:  VITAL SIGNS:  Blood pressure 149/88, pulse 52.  He is afebrile.  GENERAL:  He is well developed and well nourished.  No acute distress.  Skin is warm and dry.  He does not appear to be depressed, and there is  no peripheral clubbing.  His back is normal.  HEENT:  Normal with normal eyelids.  NECK:  Supple with a normal upstroke bilaterally.  There is a right  carotid bruit noted.  There is no thyromegaly noted.  CHEST:  Clear to auscultation with normal expansion.  CARDIOVASCULAR:  Reveals regular rate and rhythm.  There are no murmurs,  rubs or gallops noted.  I cannot palpate a PMI.  ABDOMEN:  Nontender.  Positive bowel sounds.  No hepatosplenomegaly.  No  mass appreciated.  There is no abdominal bruit.  He has 2+ femoral  pulses bilaterally.  No bruits.  EXTREMITIES:  Show no edema, and I can palpate no cords.  He has 2+  dorsalis pedis pulses bilaterally.  Note:  His right ankle is in a  brace  from a previous injury.  NEUROLOGIC:  Exam is grossly intact.   His cardiac markers are negative with the initial enzymes.  His  potassium is 3.8 with BUN and creatinine of 11 and 1.13, respectively.  The D-dimer is normal.  His hemoglobin is 15.2, hematocrit of 43.9.   A chest x-ray shows low volume but no active disease.   His electrocardiogram shows a sinus bradycardia with a first-degree AV  block.  There are blocked PACs with prolongation of the PR interval.  There is left ventricular hypertrophy with repolarization abnormalities.   DIAGNOSES:  1. Chest pain.  The patient's symptoms are atypical and most likely      musculoskeletal in etiology.  Note:  They are reproduced with      palpation.  His initial enzymes are negative.  We will continue to      cycle enzymes, and, if negative, we will plan an outpatient Myoview      for risk stratification.  2. Syncope.  His symptoms are concerning for possible cardiac      arrhythmia.  I am particularly concerned about potential      bradycardia.  We will continue to follow him closely on telemetry.      He may need an outpatient CardioNet monitor.  He may ultimately      require a pacemaker.  Note:  His LV function is normal.  3. Right carotid bruit.  He will need followup carotid Doppler as an      outpatient.  4. Hypertension.  He will continue his blood pressure medicines, and      we will adjust as indicated.  5. Hyperlipidemia.  He will continue on a statin.      Madolyn Frieze Jens Som, MD, [facility]  Electronically Signed     BSC/MEDQ  D:  09/05/2007  T:  09/06/2007  Job:  [id number]

## 2011-03-19 NOTE — Assessment & Plan Note (Signed)
[facility]                            CARDIOLOGY OFFICE NOTE   NAME:Antonio Le, Antonio Le                      MRN:          [id number]  DATE:03/25/2008                            DOB:          July 05, 1935    IDENTIFICATION:  The patient is a 76 year old gentleman who I have  followed in the past, last time in February.  He has been seen in the  interval actually in April Antonio Le.   When I saw him, he had chest pain which I was not convinced was cardiac.  When he came in April, he was set up for a Myoview scan.  Indeed, he had  this done on the 20th that showed normal perfusion.   The patient said his chest pain has finally gotten better actually since  he has Dr. Ethelene Le.  He was given a prescription for a Limbrel.  He says  this helps when he takes.  Denies shortness of breath.   CURRENT MEDICINES:  1. Aspirin 81.  2. HCTZ 25.  3. Omeprazole 20 b.i.d.  4. Lactulose one teaspoon daily.  5. Potassium 10 mEq.   PHYSICAL EXAMINATION:  GENERAL:  The patient is in no distress.  VITAL SIGNS:  Blood pressure is 125/78, pulse 52, weight 169, down 5  pounds from previous.  LUNGS:  Clear.  No rales.  CARDIAC:  Regular rate and rhythm.  S1-S2, no S3, no murmurs.  ABDOMEN:  Benign.  EXTREMITIES:  No edema.   IMPRESSION:  1. Chest pain.  Appears to be musculoskeletal.  Indeed his Myoview was      normal.  I have given a prescription for Limbrel.  2. Dyslipidemia.  The patient was taken off Lipitor with an elevated      CK.  Will go ahead and repeat and be in touch with him.  He is off      statins.  He should be on them though with his history of remote MI      (no records).  I will be in touch with him once I have seen the      blood work in order to proceed.     Antonio Riffle, MD, [facility]  Electronically Signed    PVR/MedQ  DD: 03/29/2008  DT: 03/29/2008  Job #: [id number]   cc:   Antonio Le, D.O.

## 2011-03-19 NOTE — Assessment & Plan Note (Signed)
[facility]                            CARDIOLOGY OFFICE NOTE   NAME:Antonio Le, Antonio Le                      MRN:          [id number]  DATE:03/02/2008                            DOB:          04/30/1935    This is a patient of Dr. Dietrich Pates.   Antonio Le is a very pleasant 76 year old African American male patient  who has a history of a silent MI back in the 68s.  He recently saw Dr.  Dietrich Pates in February for what appears to be musculoskeletal chest  pain.  He has also had some dizziness and near-syncope and wore a  Holter, which showed sinus bradycardia in the 40s to sinus tachycardia  in the 120s with a 2.7-second pause.   The patient presents today because of ongoing chest pain.  He said Dr.  Ethelene Le had given him medication for his arthritis, which appeared to  help, but is no longer helping.  He has continued with his left anterior  chest pain that never goes away; he says it is tender to touch.  He also  complains of 3-week history of increased shortness of breath with  walking to his mailbox; this is somewhat new for him and says he cannot  walk nearly as much as he used to be able to.  He denies any chest  tightness, palpitations or significant dizziness or presyncope.   CURRENT MEDICATIONS:  That we are aware of:  1. Aspirin 81 mg daily.  2. Hydrochlorothiazide 25 mg daily.  3. Omeprazole 20 mg b.i.d.  4. Lactulose 1 teaspoon daily.  5. Kay Ciel 10 mEq daily.  6. He is also on arthritis medication by Dr. Ethelene Le.   PHYSICAL EXAM:  This is a very pleasant 76 year old African American  male in no acute distress.  Blood pressure is 145/80, pulse 41.  NECK:  Without JVD, HJR, bruit or thyroid enlargement.  LUNGS:  Clear anterior, posterior and lateral.  HEART:  Regular rate and rhythm at 40 beats per minute, normal S1 and  S2.  Distant heart sounds.  A 1/6 systolic murmur at the apex.  ABDOMEN:  Soft without organomegaly, masses, lesions  or abnormal  tenderness.  EXTREMITIES:  Without cyanosis, clubbing or edema. He has good distal  pulses.   EKG:  Sinus bradycardia with first-degree AV block, LVH and  repolarization changes.   IMPRESSION:  1. Dyspnea on exertion for 3 weeks, rule out ischemia.  2. History of remote silent myocardial infarction in the 1990s.  3. History of dizziness and near syncope with bradycardia and 2.7-      second pause on Holter monitor in February, continue to watch.  4. Musculoskeletal chest pain.  5. Dyslipidemia.   PLAN:  At this time, I have recommended the patient undergo a stress  Cardiolite.  He is leaving for [place] next week for 2-4 weeks and says  he has no time to do this in the next 2 days.  He will schedule it for  when he returns from [place] and follow up with Dr. Dietrich Pates after  that.      Antonio Reedy, PA-C  Electronically Signed      Antonio Abed, MD, [facility]  Electronically Signed   ML/MedQ  DD: 03/02/2008  DT: 03/02/2008  Job #: [id number]   cc:   Antonio Le. Antonio Le, M.D.  Antonio Le, D.O.

## 2011-03-19 NOTE — Assessment & Plan Note (Signed)
[facility]                            CARDIOLOGY OFFICE NOTE   NAME:Antonio Le, Antonio Le                      MRN:          [id number]  DATE:09/12/2008                            DOB:          06-Dec-1934    IDENTIFICATION:  Antonio Le is a 76 year old gentleman who I last saw  him in May 2009.  He has a history of dyslipidemia, chest pain, and  question hypertension.   Since seen, his biggest issues have been orthopedic.  He underwent hip  surgery in July 2009.  He has a bone chip in his ankle.  It is being  evaluated and his back pain being evaluated.   He denies chest pain (again, it did respond to Limbrel in the past).   His breathing is okay.  He does note he has had some right hand numbness  and arm numbness, but he is sleeping more on his right side, and says he  wakes up like this.   CURRENT MEDICINES:  1. Crestor 5.  2. Hydrochlorothiazide 25.  3. Klor-Con 10.  4. Omeprazole 20.  5. Limbrel 500.  6. Celebrex 200.  7. Prune juice p.r.n.   PHYSICAL EXAMINATION:  GENERAL:  On exam, the patient is in no distress.  VITAL SIGNS:  Blood pressure is 142/74, pulse is 52 and regular, and  weight 171, up 2 pounds from previous.  NECK:  No bruits.  LUNGS:  Clear.  CARDIAC:  Regular rate and rhythm and 1-2/6 soft ejection murmur heard  best at the left sternal border.  S1 and S2.  No S3.  ABDOMEN:  Benign.  No hepatomegaly.  No masses.  EXTREMITIES:  No edema.  Good pulses.   LABORATORY DATA:  Lipomed done from September 2009, shows LDL of 149,  HDL of 51, triglycerides of 137, and total cholesterol of 227 with a  particle number of 1404.   IMPRESSION:  1. Dyslipidemia.  The patient tolerated Lipitor he thinks better.  We      will go ahead and switch him back.  I had switched him over because      his CK was minimally elevated.  I will recheck this.  It had not      been elevated in the past.  We will therefore get him on Lipitor 80      and  recheck in 8 weeks' time with an AST.  2. Blood pressure fair.  It had been better in last visit.  Would      follow.   I will set to see the patient back in 6 months.  I encouraged him to  stay as active as he can.   ADDENDUM  A 12-lead EKG, sinus bradycardia, 52 beats per minute.  Type 1 secondary  AV block.  LVH with strain pattern.     Pricilla Riffle, MD, [facility]  Electronically Signed    PVR/MedQ  DD: 09/12/2008  DT: 09/13/2008  Job #: [id number]

## 2011-03-19 NOTE — Op Note (Signed)
NAMESEIJI, Antonio Le NO.:  [id number]   MEDICAL RECORD NO.:  [id number]          PATIENT TYPE:  INP   LOCATION:  0001                         FACILITY:  [facility]   PHYSICIAN:  Madlyn Frankel. Charlann Boxer, M.D.  DATE OF BIRTH:  [birth year]   DATE OF PROCEDURE:  05/17/2008  DATE OF DISCHARGE:                               OPERATIVE REPORT   PREOPERATIVE DIAGNOSIS:  Left hip osteoarthritis.   POSTOPERATIVE DIAGNOSIS:  Left hip osteoarthritis.   PROCEDURE:  Left total hip replacement.   COMPONENTS USED:  DePuy hip system, size 54 pinnacle cup.  A 36 +4  marathon neutral liner.  A size 6 high Tri-Lock stem with a 46 1.5 ball.   SURGEON:  Madlyn Frankel. Charlann Boxer, M.D.   ASSISTANT:  Yetta Glassman. Mann, PA.   ANESTHESIA:  General.   BLOOD LOSS:  200 mL.   DRAINS:  One.   COMPLICATION:  None.   INDICATIONS FOR PROCEDURE:  Antonio Le is a 76 year old male who was  referred for evaluation of persistent anterior hip pain associated with  some buttock pain.  He had been evaluated for a lumbar spine pathology  and was subsequently referred to persistence in his symptoms.  An intra-  articular injection provided almost complete relief of symptoms,  however, short-term.   Given the persistence of his symptoms and failure to respond to  conservative measures, he wished to review surgical options.  The risks  and benefits of hip replacement surgery were discussed, including the  risk of infection, DVT, component failure, need for revision and  dislocation.  Consent was obtained.   PROCEDURE IN DETAIL:  The patient was brought to the operative theater.  Once adequate anesthesia and preoperative antibiotics administered, the  patient was positioned in the right lateral decubitus position with the  identified left hip up.  The left hip was then prescrubbed and prepped  and draped in a sterile fashion.  A lateral based incision was made for  a posterior approach to the hip.  The iliotibial  band and gluteal fascia  were incised posteriorly.  The short external rotators were identified  and taken down to the posterior capsule.  An L capsulotomy was made  underneath the gluteus minimus, preserving the posterior leaflet to  protect the sciatic nerve from retractors, but also to repair it at the  end of the case.   The hip was dislocated and a neck osteotomy was made based off anatomic  landmarks utilizing a chosen neck length with head size, anatomic  landmarks and preoperative templating.  Following this, I attended to  the femur, where I broached.  I used a box osteotome to assure  laterality.  I then used the canal initiator, hand reamer and irrigated  the canal to prevent fat emboli.  I began broaching with a size 1  broach, setting my anteversion at 20-25 degrees.  I broached up to a  size 7 which decided my current neck cut.  With good torsional stability  at this point, I packed off the femur and now attended to the  acetabulum.  Retractors  were placed with the femur retracted anteriorly.  The labrum was removed.  I began reaming with a 46 reamer.  I reamed up  to 53 reamer with good bony bed preparation.  There was posterior  osteophyte that was debrided to identify correct landmarks.  The final  54 pinnacle cup was then impacted at approximately 20 degrees of forward  flexion, 35-40 degrees of abduction, which appeared to be anatomic  beneath the anterior wall of the femur.   After this, checking and assessment and cup stability, two screws were  placed to support this initial scratch fixation.  I went ahead and  placed a 36 +4 neutral marathon liner due to the orientation of the cup.   At this point, I performed a trial reduction.  A 7 broach was placed  again at the level neck cut.  The high offset neck based on his  radiographic review appeared to be too long to get him reduced.  He did  have some preoperative stiffness, but it was more than what we should be   doing to reduce it.  For that reason, we removed the 7 broach and  impacted a size 6 broach down at approximately 5-6 mm lower than the  size 7.  I used a calcar calcar miller.  The torsional stability was  excellent with this.  I did a trial reduction with a 6 high neck and a  36 1.5 ball.  The hip reduced.  The leg lengths were comparable to the  down leg as when I compared them in the preoperative position.   Given this, all trial components removed.  The final 6 high offset stem  was opened and impacted at the level where the broach sat.  The final 36  1.5 ball was impacted on a dry trunnion and the hip reduced.  It was  irrigated throughout the case and again at this point.  A medium Hemovac  drain was placed deep.  We reapproximated the posterior capsule using #1  Ethibond to the superior leaflet.  Then the iliotibial band and gluteus  fascia were reapproximated using #1 Vicryl, and the remainder of the  wound was closed with 2-0 Vicryl and a running 4-0 Monocryl.  The hip  was cleaned, dried and dressed sterilely with Steri-Strips and a Mepilex  dressing.  He was brought to the operating room, extubated in stable  condition and tolerated the procedure well.      Madlyn Frankel Charlann Boxer, M.D.  Electronically Signed     MDO/MEDQ  D:  05/17/2008  T:  05/17/2008  Job:  [id number]

## 2011-03-19 NOTE — H&P (Signed)
NAMECHON, BUHL NO.:  [id number]   MEDICAL RECORD NO.:  [id number]          PATIENT TYPE:  INP   LOCATION:  NA                           FACILITY:  [facility]   PHYSICIAN:  Madlyn Frankel. Charlann Boxer, M.D.  DATE OF BIRTH:  [birth year]   DATE OF ADMISSION:  05/17/2008  DATE OF DISCHARGE:                              HISTORY & PHYSICAL   PROCEDURE:  Left total hip arthroplasty.   CHIEF COMPLAINT:  Left hip pain.   HISTORY OF PRESENT ILLNESS:  A 76 year old male with a history of left  hip pain secondary to osteoarthritis.  It has been refractory to all  conservative treatment.  He has been presurgically assessed by Dr.  Okey Dupre, his primary care physician.  He also sees Dr. Tenny Craw, his  cardiologist.   PAST MEDICAL HISTORY:  1. Significant for osteoarthritis.  2. Hypertension.  3. GERD.  4. Glaucoma.   PAST SURGICAL HISTORY:  None.   FAMILY MEDICAL HISTORY:  Noncontributory.   SOCIAL HISTORY:  Family will be primary caregivers after surgery.   DRUG ALLERGIES:  NO KNOWN DRUG ALLERGIES.   MEDICATIONS:  1. Omeprazole 20 mg 1 p.o. b.i.d.  2. Percocet 5/325 one p.o. q.4-6 h. p.r.n. pain.  3. Lactulose 10 gm p.o. daily.  4. Potassium chloride 10 mEq p.o. daily.  5. Aspirin 81 mg p.o. daily.  6. HCTZ 25 mg 1 p.o. daily.  Verify dosage and frequency with the      patient.   REVIEW OF SYSTEMS:  RESPIRATORY:  He reports shortness of breath at  times.  Otherwise see HPI.   PHYSICAL EXAMINATION:  VITAL SIGNS:  Pulse 56, respirations 18, blood  pressure 128/76.  GENERAL:  Awake, alert and oriented, well-developed, well-nourished in  no acute distress.  NECK:  Supple.  No carotid bruits.  CHEST/LUNGS:  Clear to auscultation bilaterally.  BREASTS:  Deferred.  HEART:  Regular rate and rhythm.  S1-S2 distinct.  ABDOMEN:  Soft, nontender, nondistended.  Bowel sounds are present.  GENITOURINARY:  Deferred.  EXTREMITIES:  Left hip has decreased range of motion and increased  pain.  SKIN:  No cellulitis.  NEUROLOGIC:  Intact distal sensibilities.   LABORATORY DATA:  EKG and chest x-ray all pending presurgical testing.   IMPRESSION:  Left hip osteoarthritis.   PLAN OF ACTION:  Left total hip arthroplasty on May 17, 2008 at [facility] by surgeon Dr. Durene Romans.  Risks and complications were  discussed.   Postoperative medications were provided at time of history and physical  which included Lovenox, Robaxin, iron, aspirin, Colace, MiraLax.  Pain  medicines will be dispensed at time of surgery.     ______________________________  Yetta Glassman [address], [place]      Madlyn Frankel. Charlann Boxer, M.D.  Electronically Signed    BLM/MEDQ  D:  05/11/2008  T:  05/11/2008  Job:  [id number]   cc:   Okey Dupre, MD   Pricilla Riffle, MD, [facility]  [address]  [address]  [place]  [place] [address]

## 2011-03-19 NOTE — Letter (Signed)
May 15, 2008    Madlyn Frankel. Charlann Boxer, M.D.  [facility]  [address]  [address]  [place]  [place] [address]   RE:  ABEL, HAGEMAN  MRN:  [id number]  /  DOB:  [birth year]   Dear Dr. Charlann Boxer,   This is a letter regarding Antonio Le.  He is a patient that I follow  in cardiology clinic.  He was last seen in clinic back in May.  He has a  history of chest pain that I am not convinced is cardiac.  He has  responded to Limbrel.  He did have a Myoview scan done recently (May  2009) that showed normal perfusion.   From a cardiac standpoint, I think, that he is low risk for a major  cardiac event; and it is okay to proceed with the planned surgery.  If  you have any questions, or he has any difficulties; please let me know  and our service can follow along.    Sincerely,      Pricilla Riffle, MD, [facility]  Electronically Signed    PVR/MedQ  DD: 05/16/2008  DT: 05/16/2008  Job #: [id number]

## 2011-03-19 NOTE — Assessment & Plan Note (Signed)
[facility]                            CARDIOLOGY OFFICE NOTE   NAME:Didonato, ETHANJAMES FONTENOT                      MRN:          [id number]  DATE:09/25/2007                            DOB:          1935-03-15    IDENTIFICATION:  The patient is a 76 year old gentleman.  I last saw him  in February of this year.  He had a reported silent MI in 45.  Myoview  scan in 2004 with no ischemia or scar.   Since seen he has done okay.  He notes occasional dizziness.  He denies  chest pressure and no shortness of breath.  He had a spell in early  November of presyncope or syncope.  He attributes it actually to a GI  medicine he was on.  He was taken off this and since discharge he said  he has been feeling fine.  Again active, no dizziness, and no chest  pressure.   CURRENT MEDICATIONS:  1. Lipitor 80.  2. Hydrochlorothiazide 25.  3. Trusopt 20 b.i.d.  4. Lumigan eye drops.  5. Alphagan eye drops.  6. Helidac therapy.  7. Aspirin 81.   PHYSICAL EXAMINATION:  GENERAL:  The patient is in no distress.  VITAL SIGNS:  Blood pressure 128/80, pulse 52 and regular, weight 171  down from 176 in February.  LUNGS:  Clear.  HEART:  Regular rate and rhythm, S1 and S2.  No S3, no murmurs.  ABDOMEN:  Benign.  EXTREMITIES:  No edema.   IMPRESSION:  1. Spell.  I am not sure what this represented.  He has been doing      fine since the medicine has been discontinued.  I am not sure what      this was.  I would hold off on scheduling a Myoview for now since      he is doing so well.  2. Hypertension, good control.  3. Dyslipidemia on Lipitor.  4. Bradycardia asymptomatic.  Holter monitor back in October showed      sinus rhythm, rates 38-90, average beat of 55 beats per minute,      longest pause 2.4 seconds.  No symptoms.  Would follow.    Pricilla Riffle, MD, [facility]  Electronically Signed   PVR/MedQ  DD: 09/27/2007  DT: 09/27/2007  Job #: [id number]   cc:   Chauncey Reading,  D.O.

## 2011-03-22 NOTE — Assessment & Plan Note (Signed)
[name]                            CARDIOLOGY OFFICE NOTE   NAME:[name], Antonio ZUERCHER                      MRN:          [id number]  DATE:01/02/2007                            DOB:          Oct 08, 1935    IDENTIFICATION:  Mr. Antonio [name] is a 76 year old who I saw back in September  previously.  It was a first visit back in August.  He had a reported  silent MI back in 1990.  Myoview scan in 2004 showed no ischemia or  scar.   When I saw him in the Fall, he had some bradycardia and also some gassy  sensations.  The patient said he was active though.  He had a markedly  abnormal EKG and actually echocardiogram looks like he has atrial  hypertrophy, I thought.  Recommendation followup echo in one year from  September.   In the interval, he has done okay.  He continues to walk, he said about  10 miles a day.  No pain, still intermittent gassy sensation.  He points  to the left upper quadrant of his abdomen.  Still has occasional drawing  sensation in his chest that he has had for decades, but he is still  walking and has no change in his activity.   CURRENT MEDICATIONS:  1. Lipitor 80.  2. Hydrochlorothiazide 25.  3. Tricept 20 b.i.d.  4. Lumigan and Alphagan,  5. Aspirin 81 mg daily.   PHYSICAL EXAMINATION:  The patient is in no distress.  Blood pressure is  145/70, pulse 47, weight 176.  LUNGS:  Clear.  CARDIAC:  Regular rate and rhythm, S1 and S2, no S3.  No murmurs.  ABDOMEN:  Benign.  EXTREMITIES:  No edema.   IMPRESSION:  1. Gassy sensation.  I am not convinced it is cardiac.  Again, would      follow.  2. Hypertrophic cardiomyopathy.  EKG markedly abnormal; going along      with this is marked ST depression, T-wave inversion.  Will follow      up an echo in the Fall.  Continue on same medicines since he has      been on these for years.  3. Dyslipidemia.  I will need to follow up on his most recent panel.      Actually it looks like it is  quite remote, but we will be in touch      with him.   Encouraged him to stay active.  I will set followup for the Fall.  Would  repeat echo.     Antonio Riffle, MD, [facility]  Electronically Signed    PVR/MedQ  DD: 01/02/2007  DT: 01/02/2007  Job #: [id number]   cc:   Antonio [name], D.O.

## 2011-03-22 NOTE — Assessment & Plan Note (Signed)
[facility]                              CARDIOLOGY OFFICE NOTE   NAME:[name] [name] PEASLEY                      MRN:          [id number]  DATE:07/21/2006                            DOB:          [birth year]    IDENTIFICATION:  [name] [name] is a 76 year old gentleman who I saw for the  first time back in August. He was previously seen in cardiology back in  2004. Has a history of reported CAD with a silent MI in 1990.  Myoview  scan in October 2004 showed no ischemia, no scar.   He was noted by Dr. Landis Martins to have some bradycardia, also to have some local  sensations of gas in his chest and abdomen.  They occur with and without  activity.  Patient walks 10 miles by his report per day previously, now 3  miles per day over an hour.   In seeing him, he was found to be bradycardic. I went ahead and scheduled a  Holter monitor.  It showed heart rates of 36 to 128 beats per minute with an  average heart rate of 56 beats per minute.  Longest pause was 2.7 seconds.  Interestingly, the patient would have episodes where his heart rate went  from 80 to 50 rather abruptly.  I have shown this to Berton Mount, who  concurs.   The patient also had a Myoview scan that showed no evidence of ischemia.  EF  was reported at 50%.   The patient continues to have intermittent gassy sensation down and under  his left breast, occurs with and without activity, no change.  Says he burps  a lot.   CURRENT MEDICATIONS:  1. Lipitor 40 nightly.  2. Hydrochlorothiazide 25 q. day.  3. Trusopt 20 b.i.d.  4. __________ drops.  5. Alphagan drops.  6. Aspirin 81 mg q. day.   PHYSICAL EXAMINATION:  The patient is in no distress.  Blood pressure  132/62.  Pulse is 55 and regular.  Weight 170.  LUNGS:  Are clear.  CARDIAC:  Regular rate and rhythm.  S1, S2, no S3.  No significant murmurs.  ABDOMEN:  Is benign.  EXTREMITIES:  No edema.   IMPRESSION:  1. Gassy sensation.  Not clear  what this represents.  Wonder if indeed it      is related to his heart rate.  EKG today shows sinus rhythm, marked      bradycardia with a heart rate of 40,  PR interval of 254 milliseconds.      There is LVH with strain __________ versus cannot exclude ischemia.      There is deep T-wave inversion in V2 through V6, I, II, and F.      Unchanged from EKG on previous visit and indeed he had these changes      back in 2004.  An echocardiogram done in September 2002 shows significant LVH in the  septum.  I would recommend a repeat echo to reevaluate the septum.  I also  tried to get him setup for an event monitor to see if these spells  correlate  with any changes in his rhythm.  1. Bradycardia; again, longest pause 2.7 seconds.  I am not sure if any of      his changes in heart rate reflect his symptoms; again event monitor as      noted.  2. Dyslipidemia.  Patient currently on Lipitor.  I do not have a recent      lipid panel followed locally.   I will set the patient up for followup after the echo.  I will be in touch  with him regarding this.                                Pricilla Riffle, MD, [facility]    PVR/MedQ  DD:  07/21/2006  DT:  07/22/2006  Job #:  [id number]

## 2011-03-22 NOTE — Discharge Summary (Signed)
[employer]  Patient:    Antonio [name], Antonio [name]                      MRN: [id number] Adm. Date:  [id number] Disc. Date: [id number] Attending:  Alfonso Ramus CC:         [facility]                           Discharge Summary  DISCHARGE DIAGNOSES: 1.  Acute pancreatitis, resolved. 2.  Atypical chest pain. 3.  Hypertension. 4.  History of "silent MI" in 56 in [place]. 5.  Hypercholesterolemia.  DISCHARGE MEDICATIONS:  Norvasc 10 mg p.o. q.d. and baby aspirin 81 mg p.o. q.d.  PROCEDURES:  There was an echocardiogram dated May 16, 2000 and results are still pending at the time of discharge.  CONSULTATIONS:  There were none.  H&P AND CHIEF COMPLAINT:  Chest pain.  This is a 76 -year-old, African-American male with a history of a silent MI in [place] in 1990 who presents with a one-day history of substernal / epigastric pain which began after after the burial of his brother.  The pain is constant and not associated with any shortness of breath, nausea or vomiting but at times he has had some sweats.  The patient cannot identify any relieving or aggravating factors.  He is not able to relate this to any change in his appetite or intake of food.  The patient denied any hematemesis, melena or hematochezia. He thinks he may have high cholesterol but has not been medicated for this and has a remote history of tobacco use from 1959 to 1965.  The patient denies any compromise in his exercise tolerance.  PAST MEDICAL HISTORY: 1.  "Silent MI" in 53 in [place].  The patient described feeling weak and     tired but denied any chest pain during that episode. 2.  Hypertension.  The patient currently takes hydrochlorothiazide. 3.  History of hypercholesterolemia but he does not take any medication for     this. 4.  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  PAST SURGICAL HISTORY: In 1965 he had an  umbilical hernia repair.  MEDICATIONS ON ADMISSION:  Baby aspirin 81 mg p.o. q.d., hydrochlorothiazide 25 mg p.o. q.d., Tylenol as needed for pain.  ALLERGIES:  Not allergic to any drugs.  FAMILY HISTORY:  His father died of old age in his 39s.  His mother had diabetes and passed away in her 57s of complications from this.  His brother recently passed away with prostate cancer and his oldest son had a mental breakdown.  SOCIAL HISTORY: The patient denies any tobacco use in the previous 35 years. He has not drunk any alcohol in the past 30 years and denies any illicit drug use.  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  REVIEW OF SYSTEMS:  The patient denies any fatigue, fevers or night sweats. Otherwise the review of systems is negative except for some trouble initiating his stream of urine which comes and goes.  PHYSICAL EXAMINATION:  VITAL SIGNS:  Temperature is 97.6, BP is 178/100, heart rate is 82, respiratory rate is 18, 95% saturated on room air.  GENERAL:  He is a well-appearing African-American male in no acute distress.  CARDIOVASCULAR:  Regular rate and rhythm, no murmurs, rubs or gallops, no  JVD, 2+ palpable distal pulses.  RESPIRATORY:  Clear to auscultation bilaterally, no wheezes, rhonchi or rales.  GI:  Positive bowel sounds, soft, slightly distended and mild tenderness in the epigastrium.  There is no rebound or guarding, there is no HSM and no palpable mass.  EXTREMITIES: Without edema.  NEUROLOGICALLY:  Awake and oriented times three, cranial nerves II through XII are intact bilaterally except for some decreased hearing bilaterally. Strength is 5/5 bilateral upper and lower extremities, DTRs 2+ bilateral upper and lower extremities.  RECTAL:  Significant for a large, however smooth, prostate and heme negative stool.  ADMISSION LABS:  Significant for an amylase of 223, lipase of 105, wbcs  6.7, hemoglobin 15.1, hematocrit 42.7, platelets 170, sodium 134, potassium 3.2, chloride 102, CO2 26, BUN 14, creatinine 1.2, and glucose 88 and calcium 9.1. Total protein 7.3 and albumin 3.7.  LFTs are within normal limits.  Total bili is 0.5.  EKG reveals some LVH by voltage criteria, normal sinus rhythm and there are some nonspecific T wave changes.  This EKG is consistent with a strain pattern.  Chest x-ray reveals no active disease.  HOSPITAL COURSE: 1.  Pancreatitis.  The patient was made NPO and started on d5 normal saline     for hydration and morphine p.r.n. for pain control.  KUB was obtained to     rule out free air in the abdomen or any obvious pancreatic abnormality.     This KUB was read as normal.  Abdominal ultrasound was obtained on     hospital day two and this was also found to be within normal limits.     However, the pancreas was not well visualized.  The patients pain     decreased significantly by hospital day two and he was able to start a     clear liquid diet which was soon thereafter advanced to full regular diet.     The patient is currently experiencing no pain and is able to eat a     regular diet. 2.  Atypical chest pain.  We admitted the patient to a telemetry unit and     cycled his enzymes to rule out MI.  He was given oxygen and morphine as     needed.  However, oxygen saturations remained in the high 90s without     oxygen.  His enzymes were negative times three with CK as high as 272,     CK-MB as high as 4.0 and troponin I nondetectable.  His     hydrochlorothiazide was held throughout this hospitalization and he was     put on metoprolol 25 mg p.o. b.i.d., however, this medication was changed     prior to discharge to Norvasc 10 mg p.o. q.d. due to his rigorous exercise     routine.  It was felt that he may not be able to do this under the effects     of a beta-blocker.  DISCHARGE LABS:  Include a PSA of 0.44, total cholesterol 211,  triglycerides of 48, HDL of 42 and LDL of 159, sodium 140, potassium 3.9, chloride 108, CO2 is 25, BUN is 7, creatinine is 1.1 and glucose is 127.   The patient is to follow up in the internal medicine outpatient clinic with me at which time we will address his hypertension on his new medicine as well as his hypercholesterolemia.  His goal LDL should be below 100 for secondary prevention of heart disease. DD:  05/16/00 TD:  05/16/00 Job: [id number]

## 2011-03-22 NOTE — Assessment & Plan Note (Signed)
[facility]                              CARDIOLOGY OFFICE NOTE   NAME:Antonio [name], Antonio [name]                      MRN:          [id number]  DATE:06/30/2006                            DOB:          02-16-1935    IDENTIFICATION:  Antonio [name] is a 76 year old gentleman who was actually last  seen in Cardiology Clinic back in 2004; I had seen him previously back in  2004 as well.  He has a history of coronary artery disease, though not  documented with a report of silent MI back in 61 while in [place].  He  also has a history of hypertension and dyslipidemia.  Of note, he had a  Myoview scan done in October 2004 that showed probable normal perfusion mild  soft tissue attenuation, LVF of 54%.   In the interval, the patient has been followed by Dr. Landis Martins.  He has a  history of bradycardia, but found to have more bradycardia.   On talking to the patient, he says he has had localized area of gas  sensation in his left breast.  It is pain.  It occurs intermittently a  couple of times per day, plus/minus activity over the past month.  No  shortness of breath.   He used to talk 10 miles per day, now walks 3 miles per day over about a  hour's time and notes no fatigue.  He does tire out though with a hill.  He  notes no dizziness and no syncope.   CURRENT MEDICATIONS:  1. Hydrochlorothiazide 12.5 mg daily.  2. Aspirin 81 mg daily/p.r.n.   PHYSICAL EXAM:  GENERAL:  On exam, the patient is in no distress.  VITAL SIGNS:  Blood pressure 118/68.  Pulse is 40.  Weight 163.  LUNGS:  Clear.  CARDIAC:  Regular rate and rhythm, S1 and S2, no S3, no significant murmurs.  ABDOMEN:  Benign.  EXTREMITIES:  Good dorsalis pedis pulses.  No edema.   Twelve-lead EKG shows junctional escape rhythm, note P waves noted.  LVH  with repolarization abnormality, cannot exclude ischemia.   In comparison to EKG from 2004, junctional escape rhythm is currently  present with a  slower rate.   This may indeed be why Antonio [name] is feeling more symptomatic.  I would  recommend getting a Holter monitor to evaluate.  I note his QRS is 96 msec  at present.   I have also recommended scheduling him for a stress test to see what his  exercise capacity is and to evaluate for any inducible ischemia.   Note echocardiogram was done in the past that showed moderate LVH.  Note he  had an interventricular septum that was 17 mm in the past, no evidence of  outflow obstruction or any HOCM physiology.  Would follow for now, no  testing at present.   I will be in touch with the patient once I have seen the test results.  For  now, continue activities as tolerated.  Pricilla Riffle, MD, [facility]    PVR/MedQ  DD:  07/02/2006  DT:  07/03/2006  Job #:  [id number]   cc:   Chauncey Reading, DO

## 2011-03-22 NOTE — Discharge Summary (Signed)
Antonio [name], Antonio [name] NO.:  [id number]   MEDICAL RECORD NO.:  [id number]          PATIENT TYPE:  INP   LOCATION:  1607                         FACILITY:  [facility]   PHYSICIAN:  Madlyn Frankel. Charlann Boxer, M.D.  DATE OF BIRTH:  [birth year]   DATE OF ADMISSION:  05/17/2008  DATE OF DISCHARGE:  05/20/2008                               DISCHARGE SUMMARY   ADMITTING DIAGNOSES:  1. Osteoarthritis  2. Hypertension.  3. Gastroesophageal reflux disease.  4. Glaucoma.   DISCHARGE DIAGNOSES:  1. Osteoarthritis.  2. Hypertension.  3. Gastroesophageal reflux disease.  4. Glaucoma.   HISTORY OF PRESENT ILLNESS:  A 76 year old male with a history of left  hip pain secondary to osteoarthritis.  Refractory to all conservative  treatment.  Presurgically assessed by his primary care physician, Dr.  Okey Dupre prior to surgery.   CONSULTS:  None.   PROCEDURE:  Left total hip arthroplasty by surgeon, Dr. Durene Romans and  assistant, Dwyane Luo PA-C.   LABORATORY DATA:  CBC upon admission; hemoglobin 13.7, hematocrit 39.4,  platelets 156.  At the time of discharge, hemoglobin 11.4, hematocrit  33.6 and platelets 129.  White cell differential all within normal  limits.  Coagulation all within normal limits.  Routine chemistry on  admission all normal.  At the time of discharge, chemistry panel showed  a sodium of 137, potassium 3.4, glucose 117.   Kidney function all normal.  Calcium 8.6 at discharge.  UA on admission  was negative.   Cardiology; EKG showed sinus bradycardia with sinus arrhythmia with  first-degree AV block and left ventricular hypertrophy.  No changes in  comparison to previous EKGs.   RADIOLOGY:  No chest x-ray film on chart.   HOSPITAL COURSE:  The patient admitted to the hospital for left total  hip replacement.  He underwent the procedure and did well.  He did have  a significant amount of ankle pain due to severe ankle arthritis.  We  started him on some Celebrex  while in the hospital.  Otherwise, he did  well with his hip.  No significant events, afebrile, hemodynamically  stable.  Dressing changed on a daily basis with no drainage from the  wound.  No sign of infection.  He was neurovascularly intact to his left  lower extremity throughout his course of stay.  He did well with  physical therapy.  He was weightbearing as tolerated with the use of a  rolling walker.  He met all functional criteria for discharge by day  three, doing well.  We did continue him on Celebrex for his ankle as  well.   DISCHARGE DISPOSITION:  Stable, improved condition.   DISCHARGE PHYSICAL THERAPY:  Weightbearing as tolerated with the use of  a rolling walker.   DISCHARGE DIET:  Regular.   DISCHARGE WOUND CARE:  Keep dry.   DISCHARGE MEDICATIONS:  1. Lovenox 40 mg subcu q.24 x11 days.  2. Robaxin 500 mg p.o. q.6.  3. Iron 325 mg p.o. t.i.d. x2 weeks.  4. Aspirin 325 mg p.o. daily x4 weeks after Lovenox completed.  5. Colace 100  mg p.o. b.i.d.  6. MiraLax 17 gm p.o. daily.  7. Vicodin 5/325 one-to-two p.o. q.4-6 p.r.n. pain.  8. Celebrex 200 mg 1 p.o. daily for ankle pain.  9. Crestor 5 mg p.o. nightly.  10.HCTZ 25 mg 1 p.o. q.a.m.  11.Lumigan eye drops 0.03% twice a day.  12.Klor-Con 10 mEq p.o. q.a.m.  13.Lactulose 1 teaspoon p.r.n.  14.Omeprazole 20 mg p.o. q.a.m.  15.Limbrel 500 mg p.o. q.a.m.   DISCHARGE FOLLOWUP:  Follow up with Dr. Charlann Boxer at phone number [phone].     ______________________________  Yetta Glassman. [address], [place]      Madlyn Frankel. Charlann Boxer, M.D.  Electronically Signed    BLM/MEDQ  D:  06/21/2008  T:  06/21/2008  Job:  [id number]   cc:   Okey Dupre, MD

## 2011-03-22 NOTE — Letter (Signed)
July 21, 2006      RE:  EVRETT, HAKIM  MRN:  [id number]  /  DOB:  1934-11-25   To Whom It May Concern:   This is a letter regarding Antonio [name] (date of birth 03/07/1935).  He  is a 76 year old gentleman who I have seen in Cardiology clinic.  He has a  history of CAD, bradycardia.  On evaluation so far,  he has a Holter monitor  that shows significant changes in his heart rate, slow to fast to slow, that  are fairly abrupt.  Average heart rate low at 56 b.p.m. with pauses up to  2.7 seconds.  He did not have any severe symptoms during this 24 hour period  but has had chest discomfort intermittently.  I would like to set him up for  an event monitor to see if there is any correlation in the heart rate  changes again, since they are fairly abrupt, and his symptoms.  I am asking  for approval for this monitor so he can take it home for 30 days.  Hopefully, this will help clarify any correlation between these.   If you have any further questions, please feel free to contact me.    Sincerely,      Pricilla Riffle, MD, [facility]   PVR/MedQ  DD:  07/21/2006  DT:  07/22/2006  Job #:  [id number]

## 2011-07-22 ENCOUNTER — Encounter: Payer: Self-pay | Admitting: Internal Medicine

## 2011-07-22 ENCOUNTER — Ambulatory Visit (INDEPENDENT_AMBULATORY_CARE_PROVIDER_SITE_OTHER): Payer: Medicare Other | Admitting: Internal Medicine

## 2011-07-22 VITALS — BP 124/74 | HR 52 | Ht 64.0 in | Wt 168.0 lb

## 2011-07-22 DIAGNOSIS — I2510 Atherosclerotic heart disease of native coronary artery without angina pectoris: Secondary | ICD-10-CM

## 2011-07-22 DIAGNOSIS — E785 Hyperlipidemia, unspecified: Secondary | ICD-10-CM

## 2011-07-22 DIAGNOSIS — I442 Atrioventricular block, complete: Secondary | ICD-10-CM

## 2011-07-22 DIAGNOSIS — I10 Essential (primary) hypertension: Secondary | ICD-10-CM

## 2011-07-22 NOTE — Patient Instructions (Signed)
Your physician recommends that you schedule a follow-up appointment in: AS NEEDED  Your physician recommends that you continue on your current medications as directed. Please refer to the Current Medication list given to you today.  

## 2011-07-23 ENCOUNTER — Encounter: Payer: Self-pay | Admitting: Internal Medicine

## 2011-07-23 DIAGNOSIS — I2510 Atherosclerotic heart disease of native coronary artery without angina pectoris: Secondary | ICD-10-CM | POA: Insufficient documentation

## 2011-07-23 NOTE — Assessment & Plan Note (Signed)
Will check fasting lipids from primary.

## 2011-07-23 NOTE — Assessment & Plan Note (Signed)
S/p permanent pacemaker.  Followed by Odessa Fleming.  I will be available as needed.

## 2011-07-23 NOTE — Progress Notes (Signed)
HPI Patient is a 76 year old with a history of mild CAD (30% ), bradycardia (s/p pacemaker), dyslipidemia. Since I saw him last he has been seen by Odessa Fleming. He is doing good.  He is walking many miles per day.  No chest pain.  Chest wall pain in improved.  No SOB  No dizziness. No Known Allergies  Current Outpatient Prescriptions  Medication Sig Dispense Refill  . aspirin 81 MG tablet Take 81 mg by mouth daily.        . Cholecalciferol (VITAMIN D3) 1000 UNITS tablet Take 1,000 Units by mouth daily.        . clonazePAM (KLONOPIN) 0.5 MG tablet Take 0.5 mg by mouth at bedtime as needed.        . darifenacin (ENABLEX) 15 MG 24 hr tablet Take 15 mg by mouth daily.        . hydrochlorothiazide 25 MG tablet Take 25 mg by mouth daily.        . metFORMIN (GLUCOPHAGE-XR) 500 MG 24 hr tablet Take 500 mg by mouth daily with breakfast.        . nystatin-triamcinolone (MYCOLOG II) cream Apply 1 application topically as needed.        Marland Kitchen omeprazole (PRILOSEC) 40 MG capsule Take 40 mg by mouth daily.        Marland Kitchen oxyCODONE (ROXICODONE) 15 MG immediate release tablet as needed.      . potassium chloride SA (K-DUR,KLOR-CON) 20 MEQ tablet Take 20 mEq by mouth daily.       . Psyllium (VEGETABLE LAXATIVE PO) Take 1 tablet by mouth daily.        . rosuvastatin (CRESTOR) 20 MG tablet Take 20 mg by mouth daily.        . Tamsulosin HCl (FLOMAX) 0.4 MG CAPS Take 0.4 mg by mouth daily.          Past Medical History  Diagnosis Date  . CAD (coronary artery disease)     cath 01/12 no CAD, no records of reported silent MI in [facility], in [place], normal [place] 2007, EF 65%, mild diastolic dysfunction  . 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 Graciela Husbands  . Hyperlipidemia   . Hypertension   . GERD (gastroesophageal reflux disease)   . Meningioma     involves right optic nerve  . Chronic constipation     GI referral to Dr. Randa Evens in the  past, pt did not go  . Idiopathic acute pancreatitis   . Syncopal episodes   . Renal insufficiency     creatinine ~1.2 in the past  . H. pylori infection     PUD association  . History of alcohol abuse   . Glaucoma, open angle   . Cervical spondylarthritis   . Tinea cruris     Past Surgical History  Procedure Date  . Left hip replacement   . Cataract extraction     No family history on file.  History   Social History  . Marital Status: Married    Spouse Name: N/A    Number of Children: N/A  . Years of Education: N/A   Occupational History  . Not on file.   Social History Main Topics  . Smoking status: Never Smoker   . Smokeless tobacco: Not on file  . Alcohol Use: No     stopped 1974  . Drug Use: No  . 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 never smoked. heavy alcohol use in the past (>30 yr ago)   Other Topics Concern  . Not on file   Social History Narrative  . No narrative on file    Review of Systems:  All systems reviewed.  They are negative to the above problem except as previously stated.  Vital Signs: BP 124/74  Pulse 52  Ht 5\' 4"  (1.626 m)  Wt 168 lb (76.204 kg)  BMI 28.84 kg/m2  Physical Exam  Patient is in NAD HEENT:  Normocephalic, atraumatic. EOMI, PERRLA.  Neck: JVP is normal. No thyromegaly. No bruits.  Lungs: clear to auscultation. No rales no wheezes.  Heart: Regular rate and rhythm. Normal S1, S2. No S3.   No significant murmurs. PMI not displaced.  Abdomen:  Supple, nontender. Normal bowel sounds. No masses. No hepatomegaly.  Extremities:   Good distal pulses throughout. No lower extremity edema.  Musculoskeletal :moving all extremities.  Neuro:   alert and oriented x3.  CN II-XII grossly intact.  EKG:  AV paced.   Assessment and Plan:

## 2011-07-23 NOTE — Assessment & Plan Note (Signed)
Minimal disease at cath.  Keep on statin.

## 2011-07-23 NOTE — Assessment & Plan Note (Signed)
Adequate control. 

## 2011-08-01 LAB — DIFFERENTIAL
Basophils Absolute: 0
Basophils Relative: 1
Eosinophils Absolute: 0.1
Neutro Abs: 3.1
Neutrophils Relative %: 54

## 2011-08-01 LAB — URINALYSIS, ROUTINE W REFLEX MICROSCOPIC
Glucose, UA: NEGATIVE
Hgb urine dipstick: NEGATIVE
Ketones, ur: NEGATIVE
Protein, ur: NEGATIVE
Urobilinogen, UA: 0.2

## 2011-08-01 LAB — CBC
MCHC: 34.8
MCV: 91.8
Platelets: 156

## 2011-08-01 LAB — BASIC METABOLIC PANEL
BUN: 12
CO2: 26
Calcium: 9.2
Chloride: 104
Creatinine, Ser: 1.03

## 2011-08-01 LAB — PROTIME-INR
INR: 1
Prothrombin Time: 13.6

## 2011-08-01 LAB — TYPE AND SCREEN: ABO/RH(D): A NEG

## 2011-08-02 LAB — CBC
Hemoglobin: 10.8 — ABNORMAL LOW
Hemoglobin: 11.4 — ABNORMAL LOW
Hemoglobin: 12.2 — ABNORMAL LOW
MCHC: 33.9
MCHC: 34.2
Platelets: 129 — ABNORMAL LOW
Platelets: 164
RBC: 3.59 — ABNORMAL LOW
RDW: 14.6
RDW: 14.7
WBC: 10.2

## 2011-08-02 LAB — BASIC METABOLIC PANEL
BUN: 6
CO2: 28
Calcium: 8.6
Calcium: 9
Creatinine, Ser: 0.98
GFR calc non Af Amer: >60
Glucose, Bld: 117 — ABNORMAL HIGH
Glucose, Bld: 127 — ABNORMAL HIGH
Sodium: 137
Sodium: 139

## 2011-08-13 LAB — BASIC METABOLIC PANEL
BUN: 17
BUN: 20
BUN: 11
CO2: 24
CO2: 26
Calcium: 9
Calcium: 9.5
Chloride: 96
Chloride: 101
Creatinine, Ser: 1.02
GFR calc Af Amer: >60
GFR calc non Af Amer: 59 — ABNORMAL LOW
GFR calc non Af Amer: >60
Glucose, Bld: 116 — ABNORMAL HIGH
Glucose, Bld: 132 — ABNORMAL HIGH
Glucose, Bld: 123 — ABNORMAL HIGH
Potassium: 3.7
Potassium: 3.8
Sodium: 132 — ABNORMAL LOW
Sodium: 134 — ABNORMAL LOW

## 2011-08-13 LAB — DIFFERENTIAL
Basophils Absolute: 0
Basophils Absolute: 0
Basophils Relative: 1
Eosinophils Absolute: 0
Eosinophils Absolute: 0.1
Eosinophils Relative: 0
Eosinophils Relative: 1
Lymphocytes Relative: 24
Lymphocytes Relative: 27
Lymphs Abs: 2.1
Lymphs Abs: 2.3
Monocytes Absolute: 0.6
Monocytes Absolute: 0.7
Monocytes Absolute: 0.3
Monocytes Relative: 8
Monocytes Relative: 9
Neutro Abs: 4.9
Neutro Abs: 5.2
Neutrophils Relative %: 64

## 2011-08-13 LAB — CBC
HCT: 41.8
HCT: 47.9
HCT: 43.9
Hemoglobin: 14.3
Hemoglobin: 15.8
Hemoglobin: 16.2
Hemoglobin: 15.2
MCHC: 33.9
MCV: 95.4
MCV: 94.6
Platelets: 194
Platelets: 152
RBC: 4.83
RBC: 5.02
RDW: 13.7
WBC: 10.3
WBC: 7.5
WBC: 7.7

## 2011-08-13 LAB — HEPATIC FUNCTION PANEL
ALT: 29
AST: 41 — ABNORMAL HIGH
Bilirubin, Direct: <0.1

## 2011-08-13 LAB — COMPREHENSIVE METABOLIC PANEL
Alkaline Phosphatase: 60
BUN: 12
Chloride: 104
Glucose, Bld: 116 — ABNORMAL HIGH
Potassium: 3.6
Total Bilirubin: 0.7

## 2011-08-13 LAB — POCT CARDIAC MARKERS
CKMB, poc: 4.1
CKMB, poc: 5.4
Myoglobin, poc: 104
Operator id: 4533
Troponin i, poc: <0.05
Troponin i, poc: <0.05

## 2011-08-13 LAB — CARDIAC PANEL(CRET KIN+CKTOT+MB+TROPI)
CK, MB: 7.8 — ABNORMAL HIGH
Relative Index: 4.5 — ABNORMAL HIGH
Total CK: 155
Troponin I: 0.04

## 2011-08-13 LAB — URINALYSIS, ROUTINE W REFLEX MICROSCOPIC
Bilirubin Urine: NEGATIVE
Hgb urine dipstick: NEGATIVE
Ketones, ur: NEGATIVE
Nitrite: NEGATIVE
Protein, ur: NEGATIVE
Urobilinogen, UA: 0.2

## 2011-08-13 LAB — D-DIMER, QUANTITATIVE: D-Dimer, Quant: 0.25

## 2011-08-13 LAB — TSH: TSH: 0.848

## 2011-08-13 LAB — LIPID PANEL
Cholesterol: 159
LDL Cholesterol: 97
VLDL: 11

## 2011-08-19 ENCOUNTER — Other Ambulatory Visit: Payer: Self-pay | Admitting: Internal Medicine

## 2011-08-21 LAB — I-STAT 8, (EC8 V) (CONVERTED LAB)
Acid-base deficit: 2
Bicarbonate: 23.2
Glucose, Bld: 100 — ABNORMAL HIGH
Potassium: 3.7
TCO2: 24
pCO2, Ven: 40.9 — ABNORMAL LOW
pH, Ven: 7.362 — ABNORMAL HIGH

## 2011-08-21 LAB — HEPATIC FUNCTION PANEL
ALT: 18
Bilirubin, Direct: <0.1
Total Protein: 6.8

## 2011-08-21 LAB — POCT CARDIAC MARKERS
CKMB, poc: 2
CKMB, poc: 2.6
Myoglobin, poc: 120
Myoglobin, poc: 92.5
Operator id: 277751
Troponin i, poc: <0.05

## 2011-08-21 LAB — AMYLASE: Amylase: 178 — ABNORMAL HIGH

## 2011-08-21 LAB — LIPASE, BLOOD: Lipase: 33

## 2011-09-15 ENCOUNTER — Emergency Department (HOSPITAL_COMMUNITY)
Admission: EM | Admit: 2011-09-15 | Discharge: 2011-09-15 | Discharge status: Against Medical Advice | Payer: Medicare Other | Attending: Emergency Medicine | Admitting: Emergency Medicine

## 2011-09-15 ENCOUNTER — Encounter (HOSPITAL_COMMUNITY): Payer: Self-pay

## 2011-09-15 DIAGNOSIS — R5381 Other malaise: Secondary | ICD-10-CM | POA: Insufficient documentation

## 2011-09-15 DIAGNOSIS — R5383 Other fatigue: Secondary | ICD-10-CM | POA: Insufficient documentation

## 2011-11-13 DIAGNOSIS — E119 Type 2 diabetes mellitus without complications: Secondary | ICD-10-CM | POA: Diagnosis not present

## 2011-11-13 DIAGNOSIS — Z23 Encounter for immunization: Secondary | ICD-10-CM | POA: Diagnosis not present

## 2011-11-13 DIAGNOSIS — I10 Essential (primary) hypertension: Secondary | ICD-10-CM | POA: Diagnosis not present

## 2011-11-14 DIAGNOSIS — M5137 Other intervertebral disc degeneration, lumbosacral region: Secondary | ICD-10-CM | POA: Diagnosis not present

## 2011-12-02 DIAGNOSIS — M19019 Primary osteoarthritis, unspecified shoulder: Secondary | ICD-10-CM | POA: Diagnosis not present

## 2011-12-02 DIAGNOSIS — M5137 Other intervertebral disc degeneration, lumbosacral region: Secondary | ICD-10-CM | POA: Diagnosis not present

## 2011-12-24 ENCOUNTER — Ambulatory Visit (INDEPENDENT_AMBULATORY_CARE_PROVIDER_SITE_OTHER): Payer: Medicare Other | Admitting: Internal Medicine

## 2011-12-24 ENCOUNTER — Encounter: Payer: Self-pay | Admitting: Internal Medicine

## 2011-12-24 DIAGNOSIS — I442 Atrioventricular block, complete: Secondary | ICD-10-CM

## 2011-12-24 DIAGNOSIS — I498 Other specified cardiac arrhythmias: Secondary | ICD-10-CM

## 2011-12-24 DIAGNOSIS — Z950 Presence of cardiac pacemaker: Secondary | ICD-10-CM

## 2011-12-24 DIAGNOSIS — R001 Bradycardia, unspecified: Secondary | ICD-10-CM

## 2011-12-24 DIAGNOSIS — R079 Chest pain, unspecified: Secondary | ICD-10-CM

## 2011-12-24 LAB — PACEMAKER DEVICE OBSERVATION
AL AMPLITUDE: 2.3507 mv
ATRIAL PACING PM: 75.56
BAMS-0001: 170 {beats}/min
BATTERY VOLTAGE: 3 V
RV LEAD IMPEDENCE PM: 440 Ohm
RV LEAD THRESHOLD: 1 V
VENTRICULAR PACING PM: 99.99

## 2011-12-24 NOTE — Assessment & Plan Note (Signed)
No evidence of device infrection

## 2011-12-24 NOTE — Assessment & Plan Note (Signed)
Reasonable heart rate excursion

## 2011-12-24 NOTE — Assessment & Plan Note (Signed)
stable °

## 2011-12-24 NOTE — Assessment & Plan Note (Signed)
The patient's device was interrogated.  The information was reviewed. No changes were made in the programming.    

## 2011-12-24 NOTE — Progress Notes (Signed)
HPI  Antonio Le is a 77 y.o. male Seen in follow for pacemaker implanted for sinus node dysfunction and progressive conduction system disease with ultimate complete heart block implanted January 2012;  His energy levels are much improved. He no longer has chest pain. He does notice however a soreness which seems to track with the amount of activity.It seems to be gradually abating  Catheterization January 2012 demonstrated no ischemia. There is no peripheral edema orthopnea.  Past Medical History  Diagnosis Date  . CAD (coronary artery disease)     cath 01/12 no CAD, no records of reported silent MI in [facility], in [place], normal [place] 2007, EF 65%, mild diastolic dysfunction  . 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 Graciela Husbands  . Hyperlipidemia   . Hypertension   . GERD (gastroesophageal reflux disease)   . Meningioma     involves right optic nerve  . Chronic constipation     GI referral to Dr. Randa Evens in the past, pt did not go  . Idiopathic acute pancreatitis   . Syncopal episodes   . Renal insufficiency     creatinine ~1.2 in the past  . H. pylori infection     PUD association  . History of alcohol abuse   . Glaucoma, open angle   . Cervical spondylarthritis   . Tinea cruris     Past Surgical History  Procedure Date  . Left hip replacement   . Cataract extraction     Current Outpatient Prescriptions  Medication Sig Dispense Refill  . aspirin 81 MG tablet Take 81 mg by mouth daily.       . Cholecalciferol (VITAMIN D3) 1000 UNITS tablet Take 1,000 Units by mouth daily.        . clonazePAM (KLONOPIN) 0.5 MG tablet Take 0.5 mg by mouth 2 (two) times daily as needed. For anxiety      . hydrochlorothiazide 25 MG tablet Take 25 mg by mouth daily.        . metFORMIN (GLUCOPHAGE-XR) 500 MG 24 hr tablet Take 500 mg by mouth daily with breakfast.        . omeprazole (PRILOSEC) 40 MG  capsule Take 40 mg by mouth daily.        Marland Kitchen OVER THE COUNTER MEDICATION Take 1 tablet by mouth daily. Super beta prostate      . OVER THE COUNTER MEDICATION Take 1 capsule by mouth daily. inflamma-less otc       . oxyCODONE (ROXICODONE) 15 MG immediate release tablet Take 15 mg by mouth every 6 (six) hours as needed. For pain      . Psyllium (VEGETABLE LAXATIVE PO) Take 1 tablet by mouth daily.       . rosuvastatin (CRESTOR) 20 MG tablet Take 20 mg by mouth daily.        . Tamsulosin HCl (FLOMAX) 0.4 MG CAPS Take 0.4 mg by mouth daily.          No Known Allergies  Review of Systems negative except from HPI and PMH  Physical Exam Well developed and well nourished in no acute distress HENT normal E scleral and icterus clear Neck Supple Clear to ausculation Pacemaker pocket is well healed there is a mild keloid there is no warmth Regular rate and rhythm, no murmurs gallops or rub Soft with active bowel sounds No clubbing cyanosis and edema Alert and oriented, grossly normal motor  and sensory function Skin Warm and Dry   Assessment and  Plan

## 2011-12-24 NOTE — Patient Instructions (Signed)

## 2012-01-29 DIAGNOSIS — E119 Type 2 diabetes mellitus without complications: Secondary | ICD-10-CM | POA: Diagnosis not present

## 2012-01-29 DIAGNOSIS — G4700 Insomnia, unspecified: Secondary | ICD-10-CM | POA: Diagnosis not present

## 2012-02-27 DIAGNOSIS — D239 Other benign neoplasm of skin, unspecified: Secondary | ICD-10-CM | POA: Diagnosis not present

## 2012-02-27 DIAGNOSIS — Z85828 Personal history of other malignant neoplasm of skin: Secondary | ICD-10-CM | POA: Diagnosis not present

## 2012-02-27 DIAGNOSIS — K253 Acute gastric ulcer without hemorrhage or perforation: Secondary | ICD-10-CM | POA: Diagnosis not present

## 2012-03-05 DIAGNOSIS — M5137 Other intervertebral disc degeneration, lumbosacral region: Secondary | ICD-10-CM | POA: Diagnosis not present

## 2012-03-26 ENCOUNTER — Encounter: Payer: Medicare Other | Admitting: *Deleted

## 2012-03-27 ENCOUNTER — Encounter: Payer: Self-pay | Admitting: *Deleted

## 2012-04-02 ENCOUNTER — Encounter: Payer: Self-pay | Admitting: Internal Medicine

## 2012-04-02 ENCOUNTER — Telehealth: Payer: Self-pay | Admitting: Internal Medicine

## 2012-04-02 ENCOUNTER — Ambulatory Visit (INDEPENDENT_AMBULATORY_CARE_PROVIDER_SITE_OTHER): Payer: Medicare Other | Admitting: *Deleted

## 2012-04-02 DIAGNOSIS — I442 Atrioventricular block, complete: Secondary | ICD-10-CM

## 2012-04-02 NOTE — Telephone Encounter (Signed)
New msg Pt wants to talk you about his transmission. He doesn't understand the letter he received

## 2012-04-02 NOTE — Telephone Encounter (Signed)
Patient to resend transmission.

## 2012-04-03 LAB — REMOTE PACEMAKER DEVICE
AL AMPLITUDE: 2.395 mv
BRDY-0002RA: 50 {beats}/min
BRDY-0003RA: 130 {beats}/min
BRDY-0004RA: 130 {beats}/min
RV LEAD IMPEDENCE PM: 440 Ohm

## 2012-04-06 ENCOUNTER — Encounter (HOSPITAL_COMMUNITY): Payer: Self-pay | Admitting: Emergency Medicine

## 2012-04-06 ENCOUNTER — Emergency Department (HOSPITAL_COMMUNITY): Payer: Medicare Other

## 2012-04-06 ENCOUNTER — Emergency Department (HOSPITAL_COMMUNITY)
Admission: EM | Admit: 2012-04-06 | Discharge: 2012-04-06 | Disposition: A | Discharge status: Home / Self Care | Payer: Medicare Other | Attending: Emergency Medicine | Admitting: Emergency Medicine

## 2012-04-06 DIAGNOSIS — I2510 Atherosclerotic heart disease of native coronary artery without angina pectoris: Secondary | ICD-10-CM | POA: Diagnosis not present

## 2012-04-06 DIAGNOSIS — I10 Essential (primary) hypertension: Secondary | ICD-10-CM | POA: Insufficient documentation

## 2012-04-06 DIAGNOSIS — Z7982 Long term (current) use of aspirin: Secondary | ICD-10-CM | POA: Diagnosis not present

## 2012-04-06 DIAGNOSIS — M79641 Pain in right hand: Secondary | ICD-10-CM

## 2012-04-06 DIAGNOSIS — Z79899 Other long term (current) drug therapy: Secondary | ICD-10-CM | POA: Insufficient documentation

## 2012-04-06 DIAGNOSIS — H409 Unspecified glaucoma: Secondary | ICD-10-CM | POA: Diagnosis not present

## 2012-04-06 DIAGNOSIS — M79609 Pain in unspecified limb: Secondary | ICD-10-CM | POA: Insufficient documentation

## 2012-04-06 DIAGNOSIS — E785 Hyperlipidemia, unspecified: Secondary | ICD-10-CM | POA: Diagnosis not present

## 2012-04-06 DIAGNOSIS — H4010X0 Unspecified open-angle glaucoma, stage unspecified: Secondary | ICD-10-CM | POA: Insufficient documentation

## 2012-04-06 DIAGNOSIS — K5900 Constipation, unspecified: Secondary | ICD-10-CM | POA: Diagnosis not present

## 2012-04-06 NOTE — Discharge Instructions (Signed)
Your x-ray today is normal. I think your pain may be related to arthritis in that hand, or a pinched nerve. Ice your thumb several times a day. Ibuprofen for pain. Continue oxycodone. Follow up with primary care doctor.   Arthritis, Nonspecific Arthritis is inflammation of a joint. This usually means pain, redness, warmth or swelling are present. One or more joints may be involved. There are a number of types of arthritis. Your caregiver may not be able to tell what type of arthritis you have right away. CAUSES  The most common cause of arthritis is the wear and tear on the joint (osteoarthritis). This causes damage to the cartilage, which can break down over time. The knees, hips, back and neck are most often affected by this type of arthritis. Other types of arthritis and common causes of joint pain include:  Sprains and other injuries near the joint. Sometimes minor sprains and injuries cause pain and swelling that develop hours later.   Rheumatoid arthritis. This affects hands, feet and knees. It usually affects both sides of your body at the same time. It is often associated with chronic ailments, fever, weight loss and general weakness.   Crystal arthritis. Gout and pseudo gout can cause occasional acute severe pain, redness and swelling in the foot, ankle, or knee.   Infectious arthritis. Bacteria can get into a joint through a break in overlying skin. This can cause infection of the joint. Bacteria and viruses can also spread through the blood and affect your joints.   Drug, infectious and allergy reactions. Sometimes joints can become mildly painful and slightly swollen with these types of illnesses.  SYMPTOMS   Pain is the main symptom.   Your joint or joints can also be red, swollen and warm or hot to the touch.   You may have a fever with certain types of arthritis, or even feel overall ill.   The joint with arthritis will hurt with movement. Stiffness is present with some types  of arthritis.  DIAGNOSIS  Your caregiver will suspect arthritis based on your description of your symptoms and on your exam. Testing may be needed to find the type of arthritis:  Blood and sometimes urine tests.   X-ray tests and sometimes CT or MRI scans.   Removal of fluid from the joint (arthrocentesis) is done to check for bacteria, crystals or other causes. Your caregiver (or a specialist) will numb the area over the joint with a local anesthetic, and use a needle to remove joint fluid for examination. This procedure is only minimally uncomfortable.   Even with these tests, your caregiver may not be able to tell what kind of arthritis you have. Consultation with a specialist (rheumatologist) may be helpful.  TREATMENT  Your caregiver will discuss with you treatment specific to your type of arthritis. If the specific type cannot be determined, then the following general recommendations may apply. Treatment of severe joint pain includes:  Rest.   Elevation.   Anti-inflammatory medication (for example, ibuprofen) may be prescribed. Avoiding activities that cause increased pain.   Only take over-the-counter or prescription medicines for pain and discomfort as recommended by your caregiver.   Cold packs over an inflamed joint may be used for 10 to 15 minutes every hour. Hot packs sometimes feel better, but do not use overnight. Do not use hot packs if you are diabetic without your caregiver's permission.   A cortisone shot into arthritic joints may help reduce pain and swelling.   Any  acute arthritis that gets worse over the next 1 to 2 days needs to be looked at to be sure there is no joint infection.  Long-term arthritis treatment involves modifying activities and lifestyle to reduce joint stress jarring. This can include weight loss. Also, exercise is needed to nourish the joint cartilage and remove waste. This helps keep the muscles around the joint strong. HOME CARE INSTRUCTIONS    Do not take aspirin to relieve pain if gout is suspected. This elevates uric acid levels.   Only take over-the-counter or prescription medicines for pain, discomfort or fever as directed by your caregiver.   Rest the joint as much as possible.   If your joint is swollen, keep it elevated.   Use crutches if the painful joint is in your leg.   Drinking plenty of fluids may help for certain types of arthritis.   Follow your caregiver's dietary instructions.   Try low-impact exercise such as:   Swimming.   Water aerobics.   Biking.   Walking.   Morning stiffness is often relieved by a warm shower.   Put your joints through regular range-of-motion.  SEEK MEDICAL CARE IF:   You do not feel better in 24 hours or are getting worse.   You have side effects to medications, or are not getting better with treatment.  SEEK IMMEDIATE MEDICAL CARE IF:   You have a fever.   You develop severe joint pain, swelling or redness.   Many joints are involved and become painful and swollen.   There is severe back pain and/or leg weakness.   You have loss of bowel or bladder control.  Document Released: 11/28/2004 Document Revised: 10/10/2011 Document Reviewed: 12/14/2008 [facility] Patient Information 2012 [place], [place].

## 2012-04-06 NOTE — ED Notes (Signed)
Pt c/o of right thumb ad hand pain that radiates up his right arm. Pt denies injury or fall. Pt states pain is 8/10 and keeps him up at night.

## 2012-04-06 NOTE — ED Provider Notes (Signed)
History     CSN: [id number]  Arrival date & time 04/06/12  1150   First MD Initiated Contact with Patient 04/06/12 1224      No chief complaint on file.   (Consider location/radiation/quality/duration/timing/severity/associated sxs/prior treatment) Patient is a 77 y.o. male presenting with wrist pain. The history is provided by the patient.  Wrist Pain This is a new problem. Associated symptoms include arthralgias. Pertinent negatives include no chills, fever or weakness.  Pt states he developed right thumb and wrist pain, radiating up arm into the shoulder. States has been there for about a week. No injuries. States his thumb appears deformed. States at times it hurts more than other times. Not sure what makes it better or worse. Did nto take any medications for this.  Past Medical History  Diagnosis Date  . CAD (coronary artery disease)     cath 01/12 no CAD, no records of reported silent MI in [facility], in [place], normal [place] 2007, EF 65%, mild diastolic dysfunction  . 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 Graciela Husbands  . Hyperlipidemia   . Hypertension   . GERD (gastroesophageal reflux disease)   . Meningioma     involves right optic nerve  . Chronic constipation     GI referral to Dr. Randa Evens in the past, pt did not go  . Idiopathic acute pancreatitis   . Syncopal episodes   . Renal insufficiency     creatinine ~1.2 in the past  . H. pylori infection     PUD association  . History of alcohol abuse   . Glaucoma, open angle   . Cervical spondylarthritis   . Tinea cruris     Past Surgical History  Procedure Date  . Left hip replacement   . Cataract extraction     No family history on file.  History  Substance Use Topics  . Smoking status: Never Smoker   . Smokeless tobacco: Not on file  . Alcohol Use: No     stopped 1974      Review of Systems  Constitutional: Negative  for fever and chills.  Respiratory: Negative.   Cardiovascular: Negative.   Musculoskeletal: Positive for arthralgias.  Skin: Negative for color change and wound.  Neurological: Negative for dizziness, weakness and light-headedness.    Allergies  Review of patient's allergies indicates no known allergies.  Home Medications   Current Outpatient Rx  Name Route Sig Dispense Refill  . ASPIRIN 81 MG PO TABS Oral Take 81 mg by mouth daily.     Marland Kitchen CLONAZEPAM 0.5 MG PO TABS Oral Take 0.5 mg by mouth 2 (two) times daily as needed. For anxiety    . METFORMIN HCL ER 500 MG PO TB24 Oral Take 500 mg by mouth at bedtime.     . OMEPRAZOLE 40 MG PO CPDR Oral Take 40 mg by mouth daily.      Marland Kitchen OVER THE COUNTER MEDICATION Oral Take 1 tablet by mouth 2 (two) times daily. Super beta prostate    . OVER THE COUNTER MEDICATION Oral Take 1 capsule by mouth 2 (two) times daily. inflamma-less otc    . OVER THE COUNTER MEDICATION Oral Take 1 tablet by mouth daily. Ultimate colon care    . OXYCODONE HCL 15 MG PO TABS Oral Take 15 mg by mouth every 6 (six) hours as needed. For pain    . ROSUVASTATIN CALCIUM 20 MG  PO TABS Oral Take 20 mg by mouth daily.        BP 125/93  Pulse 60  Temp 98 F (36.7 C)  Resp 18  SpO2 99%  Physical Exam  Nursing note and vitals reviewed. Constitutional: He is oriented to person, place, and time. He appears well-developed and well-nourished. No distress.  HENT:  Head: Normocephalic.  Cardiovascular: Normal rate, regular rhythm and normal heart sounds.   Pulmonary/Chest: Effort normal and breath sounds normal. No respiratory distress. He has no wheezes. He has no rales.  Musculoskeletal:       Right thumb swelling and deformity at IP and MCP joints. Full ROM of the thumb at both joints. Pain with palpation over entire thumb, thenar eminence, and wrist. Full ROM of the shoulder, elbow, wrist.   Neurological: He is alert and oriented to person, place, and time.  Skin: Skin is  warm and dry.  Psychiatric: He has a normal mood and affect.    ED Course  Procedures (including critical care time)  No significant abnormalities noted on exam today. Full ROM of hand and thumb. Will get x-ray. No signs of infection  Dg Hand Complete Right  04/06/2012  *RADIOLOGY REPORT*  Clinical Data: Right thumb pain.  No injury.  RIGHT HAND - COMPLETE 3+ VIEW  Comparison: 02/26/2010  Findings: No acute bony abnormality.  Specifically, no fracture, subluxation, or dislocation.  Soft tissues are intact.  IMPRESSION: No acute bony abnormality.  Original Report Authenticated By: Cyndie Chime, M.D.    X-ray negative. Will d/c home with follow up with pcp. Pt already taking oxycodone 15mg  for back pain, will add ibuprofen. Pt has a thumb spika Velcro splint on, Advised to continue wearing it if helps.   1. Hand pain, right       MDM          Lottie Mussel, PA 04/06/12 1523

## 2012-04-09 NOTE — ED Provider Notes (Signed)
Medical screening examination/treatment/procedure(s) were conducted as a shared visit with non-physician practitioner(s) and myself.  I personally evaluated the patient during the encounter Pt c/o right thumb pain, constant, dull. No erythema, no sts. Normal rom.   Suzi Roots, MD 04/09/12 [phone]

## 2012-04-10 ENCOUNTER — Encounter: Payer: Self-pay | Admitting: *Deleted

## 2012-05-04 DIAGNOSIS — G4700 Insomnia, unspecified: Secondary | ICD-10-CM | POA: Diagnosis not present

## 2012-05-04 DIAGNOSIS — I10 Essential (primary) hypertension: Secondary | ICD-10-CM | POA: Diagnosis not present

## 2012-05-04 DIAGNOSIS — E119 Type 2 diabetes mellitus without complications: Secondary | ICD-10-CM | POA: Diagnosis not present

## 2012-05-06 DIAGNOSIS — I10 Essential (primary) hypertension: Secondary | ICD-10-CM | POA: Diagnosis not present

## 2012-05-06 DIAGNOSIS — F411 Generalized anxiety disorder: Secondary | ICD-10-CM | POA: Diagnosis not present

## 2012-05-06 DIAGNOSIS — E119 Type 2 diabetes mellitus without complications: Secondary | ICD-10-CM | POA: Diagnosis not present

## 2012-06-25 DIAGNOSIS — M5137 Other intervertebral disc degeneration, lumbosacral region: Secondary | ICD-10-CM | POA: Diagnosis not present

## 2012-07-09 ENCOUNTER — Ambulatory Visit (INDEPENDENT_AMBULATORY_CARE_PROVIDER_SITE_OTHER): Payer: Medicare Other | Admitting: *Deleted

## 2012-07-09 DIAGNOSIS — I442 Atrioventricular block, complete: Secondary | ICD-10-CM

## 2012-07-13 LAB — REMOTE PACEMAKER DEVICE
ATRIAL PACING PM: 85.44
BAMS-0001: 170 {beats}/min
BRDY-0002RA: 50 {beats}/min
BRDY-0003RA: 130 {beats}/min
BRDY-0004RA: 130 {beats}/min

## 2012-07-30 DIAGNOSIS — H40029 Open angle with borderline findings, high risk, unspecified eye: Secondary | ICD-10-CM | POA: Diagnosis not present

## 2012-07-30 DIAGNOSIS — H11159 Pinguecula, unspecified eye: Secondary | ICD-10-CM | POA: Diagnosis not present

## 2012-07-30 DIAGNOSIS — H26499 Other secondary cataract, unspecified eye: Secondary | ICD-10-CM | POA: Diagnosis not present

## 2012-08-18 ENCOUNTER — Encounter: Payer: Self-pay | Admitting: *Deleted

## 2012-08-28 ENCOUNTER — Encounter: Payer: Self-pay | Admitting: Internal Medicine

## 2012-10-19 ENCOUNTER — Ambulatory Visit (INDEPENDENT_AMBULATORY_CARE_PROVIDER_SITE_OTHER): Payer: Medicare Other | Admitting: *Deleted

## 2012-10-19 DIAGNOSIS — Z950 Presence of cardiac pacemaker: Secondary | ICD-10-CM

## 2012-10-19 DIAGNOSIS — I442 Atrioventricular block, complete: Secondary | ICD-10-CM

## 2012-10-20 ENCOUNTER — Encounter: Payer: Self-pay | Admitting: Internal Medicine

## 2012-10-29 DIAGNOSIS — M545 Low back pain: Secondary | ICD-10-CM | POA: Diagnosis not present

## 2012-10-29 DIAGNOSIS — M5137 Other intervertebral disc degeneration, lumbosacral region: Secondary | ICD-10-CM | POA: Diagnosis not present

## 2012-10-29 DIAGNOSIS — G894 Chronic pain syndrome: Secondary | ICD-10-CM | POA: Diagnosis not present

## 2012-10-29 DIAGNOSIS — M5030 Other cervical disc degeneration, unspecified cervical region: Secondary | ICD-10-CM | POA: Diagnosis not present

## 2012-10-31 LAB — REMOTE PACEMAKER DEVICE
AL AMPLITUDE: 2.6 mv
ATRIAL PACING PM: 77.4
BRDY-0003RA: 130 {beats}/min
BRDY-0004RA: 130 {beats}/min
VENTRICULAR PACING PM: 99.98

## 2012-11-09 DIAGNOSIS — I10 Essential (primary) hypertension: Secondary | ICD-10-CM | POA: Diagnosis not present

## 2012-11-09 DIAGNOSIS — E119 Type 2 diabetes mellitus without complications: Secondary | ICD-10-CM | POA: Diagnosis not present

## 2012-11-10 ENCOUNTER — Encounter: Payer: Self-pay | Admitting: *Deleted

## 2012-11-12 DIAGNOSIS — IMO0002 Reserved for concepts with insufficient information to code with codable children: Secondary | ICD-10-CM | POA: Diagnosis not present

## 2012-11-12 DIAGNOSIS — N400 Enlarged prostate without lower urinary tract symptoms: Secondary | ICD-10-CM | POA: Diagnosis not present

## 2012-11-12 DIAGNOSIS — Z23 Encounter for immunization: Secondary | ICD-10-CM | POA: Diagnosis not present

## 2012-11-12 DIAGNOSIS — E785 Hyperlipidemia, unspecified: Secondary | ICD-10-CM | POA: Diagnosis not present

## 2012-11-12 DIAGNOSIS — E119 Type 2 diabetes mellitus without complications: Secondary | ICD-10-CM | POA: Diagnosis not present

## 2013-01-01 ENCOUNTER — Encounter: Payer: Self-pay | Admitting: Internal Medicine

## 2013-01-01 ENCOUNTER — Ambulatory Visit (INDEPENDENT_AMBULATORY_CARE_PROVIDER_SITE_OTHER): Payer: Medicare Other | Admitting: Internal Medicine

## 2013-01-01 VITALS — BP 142/69 | HR 64 | Ht 65.0 in | Wt 171.2 lb

## 2013-01-01 DIAGNOSIS — M7500 Adhesive capsulitis of unspecified shoulder: Secondary | ICD-10-CM

## 2013-01-01 DIAGNOSIS — R6889 Other general symptoms and signs: Secondary | ICD-10-CM | POA: Diagnosis not present

## 2013-01-01 DIAGNOSIS — I442 Atrioventricular block, complete: Secondary | ICD-10-CM | POA: Diagnosis not present

## 2013-01-01 DIAGNOSIS — M7502 Adhesive capsulitis of left shoulder: Secondary | ICD-10-CM

## 2013-01-01 LAB — PACEMAKER DEVICE OBSERVATION
AL AMPLITUDE: 2.3507 mv
AL IMPEDENCE PM: 656 Ohm
AL THRESHOLD: 0.5 v
ATRIAL PACING PM: 78.14
BAMS-0001: 170 {beats}/min
BATTERY VOLTAGE: 3 v
RV LEAD AMPLITUDE: 8.4986 mv
RV LEAD IMPEDENCE PM: 440 Ohm
RV LEAD THRESHOLD: 1 v
VENTRICULAR PACING PM: 99.98

## 2013-01-01 NOTE — Patient Instructions (Signed)
Your physician has requested that you have a lexiscan myoview. For further information please visit [email or web address] Please follow instruction sheet, as given.  Remote monitoring is used to monitor your Pacemaker of ICD from home. This monitoring reduces the number of office visits required to check your device to one time per year. It allows [place] to keep an eye on the functioning of your device to ensure it is working properly. You are scheduled for a device check from home on 03/30/13. You may send your transmission at any time that day. If you have a wireless device, the transmission will be sent automatically. After your physician reviews your transmission, you will receive a postcard with your next transmission date.  Your physician wants you to follow-up in: 1 year with Dr. Graciela Husbands. You will receive a reminder letter in the mail two months in advance. If you don't receive a letter, please call our office to schedule the follow-up appointment.

## 2013-01-01 NOTE — Assessment & Plan Note (Signed)
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 He has followed up with orthopedics in the past and has an appointment next month. I have given him a prescription to see if we can get him physical therapy and I have reviewed with him basic at home maneuvers to try to begin to stretch his left shoulder. His wife is quite ill and so he has limited opportunities to pursue physical therapy more intensely

## 2013-01-01 NOTE — Assessment & Plan Note (Signed)
Stable post  pacig

## 2013-01-01 NOTE — Progress Notes (Signed)
Patient Care Team: Juline Patch, MD as PCP - General (Internal Medicine)   HPI  Antonio Le is a 78 y.o. male Seen in follow for pacemaker implanted for sinus node dysfunction and progressive conduction system disease with ultimate complete heart block implanted January 2012;  He has 2 major complaints. The first is discomfort in his left shoulder which she dates back to his pacemaker. He tries to stretch it but it hurts him so he does not.  The second is 6 months or so of exercise intolerance unaccompanied by chest pain but associated with shortness of breath and fatigue.    Catheterization January 2012 demonstrated no ischemia. There is no peripheral edema orthopnea.   Past Medical History  Diagnosis Date  . CAD (coronary artery disease)     cath 01/12 no CAD, no records of reported silent MI in [facility], in [place], normal [place] 2007, EF 65%, mild diastolic dysfunction  . 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 Graciela Husbands  . Hyperlipidemia   . Hypertension   . GERD (gastroesophageal reflux disease)   . Meningioma     involves right optic nerve  . Chronic constipation     GI referral to Dr. Randa Evens in the past, pt did not go  . Idiopathic acute pancreatitis   . Syncopal episodes   . Renal insufficiency     creatinine ~1.2 in the past  . H. pylori infection     PUD association  . History of alcohol abuse   . Glaucoma, open angle   . Cervical spondylarthritis   . Tinea cruris     Past Surgical History  Procedure Laterality Date  . Left hip replacement    . Cataract extraction      Current Outpatient Prescriptions  Medication Sig Dispense Refill  . aspirin 81 MG tablet Take 81 mg by mouth daily.       . clonazePAM (KLONOPIN) 0.5 MG tablet Take 0.5 mg by mouth 2 (two) times daily as needed. For anxiety      . metFORMIN (GLUCOPHAGE-XR) 500 MG 24 hr tablet Take 500 mg by mouth at  bedtime.       Marland Kitchen omeprazole (PRILOSEC) 40 MG capsule Take 40 mg by mouth daily.        Marland Kitchen OVER THE COUNTER MEDICATION Take 1 tablet by mouth 2 (two) times daily. Super beta prostate      . OVER THE COUNTER MEDICATION Take 1 capsule by mouth 2 (two) times daily. inflamma-less otc      . OVER THE COUNTER MEDICATION Take 1 tablet by mouth daily. Ultimate colon care      . oxyCODONE (ROXICODONE) 15 MG immediate release tablet Take 15 mg by mouth every 6 (six) hours as needed. For pain      . rosuvastatin (CRESTOR) 20 MG tablet Take 20 mg by mouth daily.         No current facility-administered medications for this visit.    No Known Allergies  Review of Systems negative except from HPI and PMH  Physical Exam BP 142/69  Pulse 64  Ht 5\' 5"  (1.651 m)  Wt 171 lb 3.2 oz (77.656 kg)  BMI 28.49 kg/m2 Well developed and well nourished in no acute distress HENT normal E scleral and icterus clear Neck Supple Device pocket well healed; without hematoma or erythema  JVP flat; carotids brisk and full Clear to ausculation  Regular rate and rhythm, no murmurs gallops or rub Soft with active bowel sounds No clubbing cyanosis none Edema Alert and oriented,  Are more significant limitation to abduction of his left arm.Skin Warm and Dry    Assessment and  Plan

## 2013-01-01 NOTE — Assessment & Plan Note (Signed)
The patient is a marked change in his exercise performance August 2013 noted on his device. He has complaints of fatigue and shortness of breath and relieved by rest. Catheterization 2012 was unrevealing. LV function at that time was normal. We'll undertake elective scan Myoview

## 2013-01-11 ENCOUNTER — Ambulatory Visit (HOSPITAL_COMMUNITY): Payer: Medicare Other | Attending: Internal Medicine | Admitting: Radiology

## 2013-01-11 VITALS — BP 125/75 | Ht 65.0 in | Wt 170.0 lb

## 2013-01-11 DIAGNOSIS — R0989 Other specified symptoms and signs involving the circulatory and respiratory systems: Secondary | ICD-10-CM | POA: Insufficient documentation

## 2013-01-11 DIAGNOSIS — I447 Left bundle-branch block, unspecified: Secondary | ICD-10-CM

## 2013-01-11 DIAGNOSIS — R0602 Shortness of breath: Secondary | ICD-10-CM | POA: Diagnosis not present

## 2013-01-11 DIAGNOSIS — R6889 Other general symptoms and signs: Secondary | ICD-10-CM

## 2013-01-11 MED ORDER — TECHNETIUM TC 99M SESTAMIBI GENERIC - CARDIOLITE
10.0000 | Freq: Once | INTRAVENOUS | Status: AC | PRN
  Administered 2013-01-11: 10 via INTRAVENOUS

## 2013-01-11 MED ORDER — TECHNETIUM TC 99M SESTAMIBI GENERIC - CARDIOLITE
30.0000 | Freq: Once | INTRAVENOUS | Status: AC | PRN
  Administered 2013-01-11: 30 via INTRAVENOUS

## 2013-01-11 MED ORDER — ADENOSINE (DIAGNOSTIC) 3 MG/ML IV SOLN
0.5600 mg/kg | Freq: Once | INTRAVENOUS | Status: AC
  Administered 2013-01-11: 43.2 mg via INTRAVENOUS

## 2013-01-11 NOTE — Progress Notes (Signed)
[facility] SITE [address] NUCLEAR MED [address] [place], [place] [address] [phone]    Cardiology Nuclear Med Study  Antonio Le is a 78 y.o. male     MRN : [id number]     DOB: 1935/03/06  Procedure Date: 01/11/2013  Nuclear Med Background Indication for Stress Test:  Evaluation for Ischemia History:  2007 MPS: NL EF: 65% 2008 ECHO: 60-65%, 11/2010 Heart Cath: N/O CAD , Pacemaker for CHB  Cardiac Risk Factors: Hypertension and Lipids  Symptoms:  Fatigue and SOB   Nuclear Pre-Procedure Caffeine/Decaff Intake:  None > 12 hrs NPO After: 3:00pm   Lungs:  clear O2 Sat: 98% on room air. IV 0.9% NS with Angio Cath:  22g  IV Site: R Antecubital x 1, tolerated well IV Started by:  Irean Hong, RN  Chest Size (in):  42 Cup Size: n/a  Height: 5\' 5"  (1.651 m)  Weight:  170 lb (77.111 kg)  BMI:  Body mass index is 28.29 kg/(m^2). Tech Comments:  No metformin today    Nuclear Med Study 1 or 2 day study: 1 day  Stress Test Type:  Adenosine  Reading MD: Dietrich Pates, MD  Order Authorizing Provider:  Sherryl Manges, MD  Resting Radionuclide: Technetium 77m Sestamibi  Resting Radionuclide Dose: 10.9 mCi   Stress Radionuclide:  Technetium 73m Sestamibi  Stress Radionuclide Dose: 33.0 mCi           Stress Protocol Rest HR: 50 Stress HR: 60  Rest BP: 125/75 Stress BP: 130/69  Exercise Time (min): n/a METS: n/a   Predicted Max HR: 145 bpm % Max HR: 41.38 bpm Rate Pressure Product: 7800   Dose of Adenosine (mg):  43.3 Dose of Lexiscan: n/a mg  Dose of Atropine (mg): n/a Dose of Dobutamine: n/a mcg/kg/min (at max HR)  Stress Test Technologist: Milana Na, EMT-P  Nuclear Technologist:  Domenic Polite, CNMT     Rest Procedure:  Myocardial perfusion imaging was performed at rest 45 minutes following the intravenous administration of Technetium 40m Sestamibi. Rest ECG: Dual chamber paced.  Stress Procedure:  The patient received IV adenosine at 140 mcg/kg/min for 4  minutes. This patient had abdominal pain with infusion. Technetium 13m Sestamibi was injected at the 2 minute mark and quantitative spect images were obtained after a 45 minute delay. Stress ECG: nondiagnostic due to pacing.  QPS Raw Data Images: Images were motion corrected.  Sifto tissue (diaphragm, bowel activity) underlies heart. Stress Images:  Normal homogeneous uptake in all areas of the myocardium. Rest Images:  Normal homogeneous uptake in all areas of the myocardium. Subtraction (SDS):  No evidence of ischemia. Transient Ischemic Dilatation (Normal <1.22):  1.10 Lung/Heart Ratio (Normal <0.45):  0.10  Quantitative Gated Spect Images QGS EDV:  116 ml QGS ESV:  49 ml  Impression Exercise Capacity:  Lexiscan with no exercise. BP Response:  Normal blood pressure response. Clinical Symptoms:  No chest pain. ECG Impression:  nondiagnostic Comparison with Prior Nuclear Study: no change from previous report.  Overall Impression:  Normal stress nuclear study.  LV Ejection Fraction: 58%.  LV Wall Motion:  NL LV Function; NL Wall Motion  Dietrich Pates

## 2013-02-25 DIAGNOSIS — M5030 Other cervical disc degeneration, unspecified cervical region: Secondary | ICD-10-CM | POA: Diagnosis not present

## 2013-02-25 DIAGNOSIS — Z79899 Other long term (current) drug therapy: Secondary | ICD-10-CM | POA: Diagnosis not present

## 2013-02-25 DIAGNOSIS — M545 Low back pain: Secondary | ICD-10-CM | POA: Diagnosis not present

## 2013-02-25 DIAGNOSIS — M5137 Other intervertebral disc degeneration, lumbosacral region: Secondary | ICD-10-CM | POA: Diagnosis not present

## 2013-03-01 DIAGNOSIS — Z85828 Personal history of other malignant neoplasm of skin: Secondary | ICD-10-CM | POA: Diagnosis not present

## 2013-03-30 ENCOUNTER — Encounter: Payer: Medicare Other | Admitting: *Deleted

## 2013-04-05 ENCOUNTER — Encounter: Payer: Self-pay | Admitting: *Deleted

## 2013-04-07 ENCOUNTER — Ambulatory Visit (INDEPENDENT_AMBULATORY_CARE_PROVIDER_SITE_OTHER): Payer: Medicare Other | Admitting: *Deleted

## 2013-04-07 DIAGNOSIS — I442 Atrioventricular block, complete: Secondary | ICD-10-CM

## 2013-04-07 DIAGNOSIS — Z950 Presence of cardiac pacemaker: Secondary | ICD-10-CM

## 2013-04-19 LAB — REMOTE PACEMAKER DEVICE
AL AMPLITUDE: 2.1 mv
BRDY-0002RA: 50 {beats}/min
BRDY-0003RA: 130 {beats}/min
RV LEAD IMPEDENCE PM: 424 Ohm

## 2013-05-04 ENCOUNTER — Encounter: Payer: Self-pay | Admitting: *Deleted

## 2013-05-06 ENCOUNTER — Encounter: Payer: Self-pay | Admitting: Internal Medicine

## 2013-05-20 DIAGNOSIS — F411 Generalized anxiety disorder: Secondary | ICD-10-CM | POA: Diagnosis not present

## 2013-05-20 DIAGNOSIS — E119 Type 2 diabetes mellitus without complications: Secondary | ICD-10-CM | POA: Diagnosis not present

## 2013-05-20 DIAGNOSIS — I10 Essential (primary) hypertension: Secondary | ICD-10-CM | POA: Diagnosis not present

## 2013-05-25 DIAGNOSIS — Z0000 Encounter for general adult medical examination without abnormal findings: Secondary | ICD-10-CM | POA: Diagnosis not present

## 2013-05-25 DIAGNOSIS — N400 Enlarged prostate without lower urinary tract symptoms: Secondary | ICD-10-CM | POA: Diagnosis not present

## 2013-05-25 DIAGNOSIS — F411 Generalized anxiety disorder: Secondary | ICD-10-CM | POA: Diagnosis not present

## 2013-05-25 DIAGNOSIS — E785 Hyperlipidemia, unspecified: Secondary | ICD-10-CM | POA: Diagnosis not present

## 2013-06-15 DIAGNOSIS — G894 Chronic pain syndrome: Secondary | ICD-10-CM | POA: Diagnosis not present

## 2013-06-15 DIAGNOSIS — Z79899 Other long term (current) drug therapy: Secondary | ICD-10-CM | POA: Diagnosis not present

## 2013-06-15 DIAGNOSIS — M5030 Other cervical disc degeneration, unspecified cervical region: Secondary | ICD-10-CM | POA: Diagnosis not present

## 2013-06-15 DIAGNOSIS — M5137 Other intervertebral disc degeneration, lumbosacral region: Secondary | ICD-10-CM | POA: Diagnosis not present

## 2013-07-19 ENCOUNTER — Ambulatory Visit (INDEPENDENT_AMBULATORY_CARE_PROVIDER_SITE_OTHER): Payer: Medicare Other | Admitting: *Deleted

## 2013-07-19 DIAGNOSIS — Z950 Presence of cardiac pacemaker: Secondary | ICD-10-CM

## 2013-07-19 DIAGNOSIS — I442 Atrioventricular block, complete: Secondary | ICD-10-CM

## 2013-07-21 DIAGNOSIS — I442 Atrioventricular block, complete: Secondary | ICD-10-CM | POA: Diagnosis not present

## 2013-07-21 DIAGNOSIS — Z950 Presence of cardiac pacemaker: Secondary | ICD-10-CM

## 2013-07-31 LAB — REMOTE PACEMAKER DEVICE
BAMS-0001: 170 {beats}/min
BRDY-0002RA: 50 {beats}/min
BRDY-0004RA: 130 {beats}/min

## 2013-08-09 ENCOUNTER — Encounter: Payer: Self-pay | Admitting: *Deleted

## 2013-08-24 DIAGNOSIS — H04129 Dry eye syndrome of unspecified lacrimal gland: Secondary | ICD-10-CM | POA: Diagnosis not present

## 2013-08-24 DIAGNOSIS — H40019 Open angle with borderline findings, low risk, unspecified eye: Secondary | ICD-10-CM | POA: Diagnosis not present

## 2013-08-24 DIAGNOSIS — H2510 Age-related nuclear cataract, unspecified eye: Secondary | ICD-10-CM | POA: Diagnosis not present

## 2013-08-24 DIAGNOSIS — Z23 Encounter for immunization: Secondary | ICD-10-CM | POA: Diagnosis not present

## 2013-08-30 ENCOUNTER — Encounter: Payer: Self-pay | Admitting: Internal Medicine

## 2013-10-25 ENCOUNTER — Encounter: Payer: Medicare Other | Admitting: *Deleted

## 2013-11-11 ENCOUNTER — Encounter: Payer: Self-pay | Admitting: *Deleted

## 2013-11-15 ENCOUNTER — Ambulatory Visit (INDEPENDENT_AMBULATORY_CARE_PROVIDER_SITE_OTHER): Payer: Medicare Other | Admitting: *Deleted

## 2013-11-15 ENCOUNTER — Telehealth: Payer: Self-pay | Admitting: Internal Medicine

## 2013-11-15 DIAGNOSIS — I442 Atrioventricular block, complete: Secondary | ICD-10-CM | POA: Diagnosis not present

## 2013-11-15 NOTE — Telephone Encounter (Signed)
New message     Pt thinks his remote transmission box is not working.  He has tried to send it but the green light stays on.  Also, pt says he sometimes have pain at the pacemaker site.

## 2013-11-15 NOTE — Telephone Encounter (Signed)
Pt has two phone line wires running from his home monitor with only one plugged into a phone jack. Gave pt instructions to correctly connect his home monitor with one phone wire. Pt has not changed phone provider companies.  Pt confirmed correct set-up and sent transmission. Transmission was successfully received.

## 2013-11-17 DIAGNOSIS — Z79899 Other long term (current) drug therapy: Secondary | ICD-10-CM | POA: Diagnosis not present

## 2013-11-17 DIAGNOSIS — M5137 Other intervertebral disc degeneration, lumbosacral region: Secondary | ICD-10-CM | POA: Diagnosis not present

## 2013-11-17 DIAGNOSIS — G894 Chronic pain syndrome: Secondary | ICD-10-CM | POA: Diagnosis not present

## 2013-11-17 DIAGNOSIS — M5030 Other cervical disc degeneration, unspecified cervical region: Secondary | ICD-10-CM | POA: Diagnosis not present

## 2013-11-17 LAB — MDC_IDC_ENUM_SESS_TYPE_REMOTE
Battery Voltage: 3 V
Brady Statistic AP VP Percent: 66.62 %
Brady Statistic AS VP Percent: 33.38 %
Brady Statistic RV Percent Paced: 99.99 %
Lead Channel Impedance Value: 648 Ohm
Lead Channel Setting Pacing Amplitude: 2 V
Lead Channel Setting Sensing Sensitivity: 2.1 mV
MDC IDC MSMT LEADCHNL RA SENSING INTR AMPL: 2.1289
MDC IDC MSMT LEADCHNL RV IMPEDANCE VALUE: 424 Ohm
MDC IDC SESS DTM: 20150112233736
MDC IDC SET LEADCHNL RV PACING AMPLITUDE: 2.5 V
MDC IDC SET LEADCHNL RV PACING PULSEWIDTH: 0.4 ms
MDC IDC SET ZONE DETECTION INTERVAL: 350 ms
MDC IDC STAT BRADY AP VS PERCENT: 0 %
MDC IDC STAT BRADY AS VS PERCENT: 0.01 %
MDC IDC STAT BRADY RA PERCENT PACED: 66.62 %
Zone Setting Detection Interval: 400 ms

## 2013-11-19 DIAGNOSIS — E119 Type 2 diabetes mellitus without complications: Secondary | ICD-10-CM | POA: Diagnosis not present

## 2013-11-19 DIAGNOSIS — E785 Hyperlipidemia, unspecified: Secondary | ICD-10-CM | POA: Diagnosis not present

## 2013-11-25 DIAGNOSIS — E119 Type 2 diabetes mellitus without complications: Secondary | ICD-10-CM | POA: Diagnosis not present

## 2013-11-25 DIAGNOSIS — R32 Unspecified urinary incontinence: Secondary | ICD-10-CM | POA: Diagnosis not present

## 2013-11-25 DIAGNOSIS — E785 Hyperlipidemia, unspecified: Secondary | ICD-10-CM | POA: Diagnosis not present

## 2013-11-25 DIAGNOSIS — F411 Generalized anxiety disorder: Secondary | ICD-10-CM | POA: Diagnosis not present

## 2013-12-05 ENCOUNTER — Encounter (HOSPITAL_COMMUNITY): Payer: Self-pay | Admitting: Emergency Medicine

## 2013-12-05 ENCOUNTER — Emergency Department (HOSPITAL_COMMUNITY)
Admission: EM | Admit: 2013-12-05 | Discharge: 2013-12-05 | Disposition: A | Discharge status: Home / Self Care | Payer: Medicare Other | Source: Home / Self Care | Attending: Emergency Medicine | Admitting: Emergency Medicine

## 2013-12-05 DIAGNOSIS — S0990XA Unspecified injury of head, initial encounter: Secondary | ICD-10-CM | POA: Diagnosis not present

## 2013-12-05 DIAGNOSIS — Z87448 Personal history of other diseases of urinary system: Secondary | ICD-10-CM | POA: Insufficient documentation

## 2013-12-05 DIAGNOSIS — I498 Other specified cardiac arrhythmias: Secondary | ICD-10-CM | POA: Insufficient documentation

## 2013-12-05 DIAGNOSIS — X58XXXA Exposure to other specified factors, initial encounter: Secondary | ICD-10-CM

## 2013-12-05 DIAGNOSIS — E785 Hyperlipidemia, unspecified: Secondary | ICD-10-CM

## 2013-12-05 DIAGNOSIS — K219 Gastro-esophageal reflux disease without esophagitis: Secondary | ICD-10-CM

## 2013-12-05 DIAGNOSIS — Z7982 Long term (current) use of aspirin: Secondary | ICD-10-CM

## 2013-12-05 DIAGNOSIS — H409 Unspecified glaucoma: Secondary | ICD-10-CM | POA: Insufficient documentation

## 2013-12-05 DIAGNOSIS — Z8669 Personal history of other diseases of the nervous system and sense organs: Secondary | ICD-10-CM

## 2013-12-05 DIAGNOSIS — Y939 Activity, unspecified: Secondary | ICD-10-CM | POA: Insufficient documentation

## 2013-12-05 DIAGNOSIS — M5380 Other specified dorsopathies, site unspecified: Secondary | ICD-10-CM | POA: Diagnosis not present

## 2013-12-05 DIAGNOSIS — Y929 Unspecified place or not applicable: Secondary | ICD-10-CM

## 2013-12-05 DIAGNOSIS — Z8619 Personal history of other infectious and parasitic diseases: Secondary | ICD-10-CM | POA: Insufficient documentation

## 2013-12-05 DIAGNOSIS — M436 Torticollis: Secondary | ICD-10-CM

## 2013-12-05 DIAGNOSIS — H4010X0 Unspecified open-angle glaucoma, stage unspecified: Secondary | ICD-10-CM | POA: Insufficient documentation

## 2013-12-05 DIAGNOSIS — R55 Syncope and collapse: Secondary | ICD-10-CM | POA: Insufficient documentation

## 2013-12-05 DIAGNOSIS — IMO0002 Reserved for concepts with insufficient information to code with codable children: Secondary | ICD-10-CM | POA: Insufficient documentation

## 2013-12-05 DIAGNOSIS — Z79899 Other long term (current) drug therapy: Secondary | ICD-10-CM

## 2013-12-05 DIAGNOSIS — S161XXA Strain of muscle, fascia and tendon at neck level, initial encounter: Secondary | ICD-10-CM

## 2013-12-05 DIAGNOSIS — K859 Acute pancreatitis without necrosis or infection, unspecified: Secondary | ICD-10-CM | POA: Insufficient documentation

## 2013-12-05 DIAGNOSIS — I2510 Atherosclerotic heart disease of native coronary artery without angina pectoris: Secondary | ICD-10-CM | POA: Insufficient documentation

## 2013-12-05 DIAGNOSIS — S139XXA Sprain of joints and ligaments of unspecified parts of neck, initial encounter: Secondary | ICD-10-CM

## 2013-12-05 DIAGNOSIS — I10 Essential (primary) hypertension: Secondary | ICD-10-CM

## 2013-12-05 DIAGNOSIS — N289 Disorder of kidney and ureter, unspecified: Secondary | ICD-10-CM | POA: Insufficient documentation

## 2013-12-05 DIAGNOSIS — S0993XA Unspecified injury of face, initial encounter: Secondary | ICD-10-CM | POA: Diagnosis not present

## 2013-12-05 DIAGNOSIS — R51 Headache: Secondary | ICD-10-CM

## 2013-12-05 DIAGNOSIS — M47812 Spondylosis without myelopathy or radiculopathy, cervical region: Secondary | ICD-10-CM | POA: Insufficient documentation

## 2013-12-05 DIAGNOSIS — M62838 Other muscle spasm: Secondary | ICD-10-CM | POA: Insufficient documentation

## 2013-12-05 DIAGNOSIS — R209 Unspecified disturbances of skin sensation: Secondary | ICD-10-CM | POA: Insufficient documentation

## 2013-12-05 DIAGNOSIS — K5900 Constipation, unspecified: Secondary | ICD-10-CM | POA: Insufficient documentation

## 2013-12-05 DIAGNOSIS — A048 Other specified bacterial intestinal infections: Secondary | ICD-10-CM | POA: Insufficient documentation

## 2013-12-05 DIAGNOSIS — B356 Tinea cruris: Secondary | ICD-10-CM | POA: Insufficient documentation

## 2013-12-05 DIAGNOSIS — D320 Benign neoplasm of cerebral meninges: Secondary | ICD-10-CM | POA: Insufficient documentation

## 2013-12-05 DIAGNOSIS — F1011 Alcohol abuse, in remission: Secondary | ICD-10-CM | POA: Insufficient documentation

## 2013-12-05 MED ORDER — KETOROLAC TROMETHAMINE 15 MG/ML IJ SOLN
15.0000 mg | Freq: Once | INTRAMUSCULAR | Status: AC
  Administered 2013-12-05: 15 mg via INTRAVENOUS
  Filled 2013-12-05: qty 1

## 2013-12-05 MED ORDER — KETOROLAC TROMETHAMINE 60 MG/2ML IM SOLN: 30.0000 mg | Freq: Once | INTRAMUSCULAR | Status: DC

## 2013-12-05 MED ORDER — DIAZEPAM 5 MG/ML IJ SOLN
5.0000 mg | Freq: Once | INTRAMUSCULAR | Status: AC
  Administered 2013-12-05: 5 mg via INTRAVENOUS
  Filled 2013-12-05: qty 2

## 2013-12-05 MED ORDER — HYDROMORPHONE HCL PF 1 MG/ML IJ SOLN
1.0000 mg | Freq: Once | INTRAMUSCULAR | Status: AC
  Administered 2013-12-05: 1 mg via INTRAVENOUS
  Filled 2013-12-05: qty 1

## 2013-12-05 MED ORDER — DIAZEPAM 5 MG PO TABS: 5.0000 mg | ORAL_TABLET | Freq: Four times a day (QID) | ORAL | Status: DC | PRN

## 2013-12-05 NOTE — ED Notes (Addendum)
Pt from home c/o neck/head pain. Pt reports that he woke up 2 days ago with head and neck pain. Pt states is now unable to move neck, touch head or sleep. Pt denies injury or visual changes, N/V. Pt is A&O and in NAD

## 2013-12-05 NOTE — ED Notes (Signed)
Pt arrived to the ED via EMS with a complaint of neck pain.  Pt was seen here this morning for same complaint.  Pt was prescribed valium for pain which he filled.  Pt states that the pain is unresolved by medication.  Pt states he is unable to move his neck or lie down comfortably.  Pt has a hx of hypertension and diabetes.  CBG 137 per EMS.

## 2013-12-05 NOTE — Discharge Instructions (Signed)
You can take motrin 400 mg every 6 hrs for pain.   Continue taking your oxycodone.   Take valium for muscle spasms.   Apply heat for comfort.   Follow up with your doctor.   Return to ER if you have severe pain, weakness, numbness.

## 2013-12-05 NOTE — ED Provider Notes (Signed)
CSN: [id number]     Arrival date & time 12/05/13  1308 History   First MD Initiated Contact with Patient 12/05/13 [phone]     Chief Complaint  Patient presents with  . Neck Pain  . Headache   (Consider location/radiation/quality/duration/timing/severity/associated sxs/prior Treatment) The history is provided by the patient.  Antonio Le is a 79 y.o. male hx of CAD, HL, HTN, arthritis here with neck pain. He woke up 3 days ago with neck pain and spasms. He had trouble moving his head afterwards and had some headaches. He's been taking oxycodone with minimal relief. Denies any fever or weakness or numbness. Denies any falls or injuries.    Past Medical History  Diagnosis Date  . CAD (coronary artery disease)     cath 01/12 no CAD, no records of reported silent MI in [facility], in [place], normal [place] 2007, EF 46%, mild diastolic dysfunction  . 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 [name] Comes  . Hyperlipidemia   . Hypertension   . GERD (gastroesophageal reflux disease)   . Meningioma     involves right optic nerve  . Chronic constipation     GI referral to Dr. Oletta Lamas in the past, pt did not go  . Idiopathic acute pancreatitis   . Syncopal episodes   . Renal insufficiency     creatinine ~1.2 in the past  . H. pylori infection     PUD association  . History of alcohol abuse   . Glaucoma, open angle   . Cervical spondylarthritis   . Tinea cruris    Past Surgical History  Procedure Laterality Date  . Left hip replacement    . Cataract extraction     No family history on file. History  Substance Use Topics  . Smoking status: Never Smoker   . Smokeless tobacco: Not on file  . Alcohol Use: No     Comment: stopped 1974    Review of Systems  Musculoskeletal: Positive for neck pain.  Neurological: Positive for headaches.  All other systems reviewed and are negative.    Allergies  Review  of patient's allergies indicates no known allergies.  Home Medications   Current Outpatient Rx  Name  Route  Sig  Dispense  Refill  . aspirin 81 MG tablet   Oral   Take 81 mg by mouth daily.          . clonazePAM (KLONOPIN) 0.5 MG tablet   Oral   Take 0.5 mg by mouth 2 (two) times daily as needed. For anxiety         . metFORMIN (GLUCOPHAGE-XR) 500 MG 24 hr tablet   Oral   Take 500 mg by mouth at bedtime.          Marland Kitchen omeprazole (PRILOSEC) 40 MG capsule   Oral   Take 40 mg by mouth daily.           Marland Kitchen OVER THE COUNTER MEDICATION   Oral   Take 1 tablet by mouth 2 (two) times daily. Super beta prostate         . OVER THE COUNTER MEDICATION   Oral   Take 1 capsule by mouth 2 (two) times daily. inflamma-less otc         . OVER THE COUNTER MEDICATION   Oral   Take 1 tablet by mouth daily. Ultimate colon care         .  oxyCODONE (ROXICODONE) 15 MG immediate release tablet   Oral   Take 15 mg by mouth every 6 (six) hours as needed. For pain         . rosuvastatin (CRESTOR) 20 MG tablet   Oral   Take 20 mg by mouth daily.            BP 148/76  Pulse 54  Temp(Src) 98.4 F (36.9 C) (Oral)  Resp 16  SpO2 94% Physical Exam  Nursing note and vitals reviewed. Constitutional: He is oriented to person, place, and time.  Chronically ill, uncomfortable   HENT:  Head: Normocephalic and atraumatic.  Mouth/Throat: Oropharynx is clear and moist.  Mild R scalp tenderness   Eyes: Conjunctivae are normal. Pupils are equal, round, and reactive to light.  Neck:  Dec ROM. + tenderness along R SCM. No midline tenderness. No obvious meningeal signs   Cardiovascular: Normal rate, regular rhythm and normal heart sounds.   Pulmonary/Chest: Effort normal and breath sounds normal. No respiratory distress. He has no wheezes. He has no rales.  Abdominal: Soft. Bowel sounds are normal. He exhibits no distension. There is no tenderness. There is no rebound and no guarding.   Musculoskeletal: Normal range of motion.  Neurological: He is alert and oriented to person, place, and time. No cranial nerve deficit. Coordination normal.  Nl strength and sensation throughout. No pronator drift   Skin: Skin is warm and dry.  Psychiatric: He has a normal mood and affect. His behavior is normal. Judgment and thought content normal.    ED Course  Procedures (including critical care time) Labs Review Labs Reviewed - No data to display Imaging Review No results found.  EKG Interpretation   None       MDM  No diagnosis found. Antonio Le is a 79 y.o. male here with neck spasms. Afebrile, I doubt meningitis or subarachnoid. Likely muscle spasms. He drove here so can only give toradol. He has oxycodone at home. Will add valium for muscle spasms.   9:16 AM Patient able to find a ride. Given toradol, valium, dilaudid. Felt better. D/c home with valium in addition to oxycodone.     Wandra Arthurs, MD 12/05/13 [phone]

## 2013-12-05 NOTE — ED Notes (Signed)
MD at bedside. 

## 2013-12-06 ENCOUNTER — Emergency Department (HOSPITAL_COMMUNITY)
Admission: EM | Admit: 2013-12-06 | Discharge: 2013-12-06 | Disposition: A | Discharge status: Home / Self Care | Payer: Medicare Other | Attending: Emergency Medicine | Admitting: Emergency Medicine

## 2013-12-06 ENCOUNTER — Emergency Department (HOSPITAL_COMMUNITY): Payer: Medicare Other

## 2013-12-06 ENCOUNTER — Encounter (HOSPITAL_COMMUNITY): Payer: Self-pay | Admitting: Emergency Medicine

## 2013-12-06 DIAGNOSIS — S0990XA Unspecified injury of head, initial encounter: Secondary | ICD-10-CM | POA: Diagnosis not present

## 2013-12-06 DIAGNOSIS — M62838 Other muscle spasm: Secondary | ICD-10-CM

## 2013-12-06 DIAGNOSIS — S0993XA Unspecified injury of face, initial encounter: Secondary | ICD-10-CM | POA: Diagnosis not present

## 2013-12-06 LAB — CBC WITH DIFFERENTIAL/PLATELET
Basophils Absolute: 0 10*3/uL (ref 0.0–0.1)
Basophils Relative: 0 % (ref 0–1)
Eosinophils Absolute: 0 10*3/uL (ref 0.0–0.7)
Eosinophils Relative: 0 % (ref 0–5)
HEMATOCRIT: 40 % (ref 39.0–52.0)
HEMOGLOBIN: 13.9 g/dL (ref 13.0–17.0)
LYMPHS ABS: 2 10*3/uL (ref 0.7–4.0)
LYMPHS PCT: 17 % (ref 12–46)
MCH: 32.4 pg (ref 26.0–34.0)
MCHC: 34.8 g/dL (ref 30.0–36.0)
MCV: 93.2 fL (ref 78.0–100.0)
MONOS PCT: 10 % (ref 3–12)
Monocytes Absolute: 1.2 10*3/uL — ABNORMAL HIGH (ref 0.1–1.0)
NEUTROS ABS: 8.5 10*3/uL — AB (ref 1.7–7.7)
NEUTROS PCT: 72 % (ref 43–77)
Platelets: 127 10*3/uL — ABNORMAL LOW (ref 150–400)
RBC: 4.29 MIL/uL (ref 4.22–5.81)
RDW: 13.5 % (ref 11.5–15.5)
WBC: 11.7 10*3/uL — AB (ref 4.0–10.5)

## 2013-12-06 LAB — BASIC METABOLIC PANEL
BUN: 9 mg/dL (ref 6–23)
CO2: 26 meq/L (ref 19–32)
Calcium: 8.8 mg/dL (ref 8.4–10.5)
Chloride: 99 mEq/L (ref 96–112)
Creatinine, Ser: 1.06 mg/dL (ref 0.50–1.35)
GFR calc non Af Amer: 66 mL/min — ABNORMAL LOW (ref >90)
GFR, EST AFRICAN AMERICAN: 77 mL/min — AB (ref >90)
GLUCOSE: 142 mg/dL — AB (ref 70–99)
POTASSIUM: 3.9 meq/L (ref 3.7–5.3)
Sodium: 137 mEq/L (ref 137–147)

## 2013-12-06 MED ORDER — DIAZEPAM 5 MG PO TABS: 5.0000 mg | ORAL_TABLET | Freq: Two times a day (BID) | ORAL | Status: DC

## 2013-12-06 MED ORDER — IBUPROFEN 400 MG PO TABS: 400.0000 mg | ORAL_TABLET | Freq: Four times a day (QID) | ORAL | Status: DC | PRN

## 2013-12-06 MED ORDER — DIAZEPAM 5 MG PO TABS
5.0000 mg | ORAL_TABLET | Freq: Once | ORAL | Status: AC
  Administered 2013-12-06: 5 mg via ORAL
  Filled 2013-12-06: qty 1

## 2013-12-06 MED ORDER — MORPHINE SULFATE 4 MG/ML IJ SOLN
4.0000 mg | Freq: Once | INTRAMUSCULAR | Status: AC
  Administered 2013-12-06: 4 mg via INTRAVENOUS
  Filled 2013-12-06: qty 1

## 2013-12-06 MED ORDER — IBUPROFEN 200 MG PO TABS
600.0000 mg | ORAL_TABLET | Freq: Once | ORAL | Status: AC
  Administered 2013-12-06: 600 mg via ORAL
  Filled 2013-12-06: qty 3

## 2013-12-06 MED ORDER — ONDANSETRON HCL 4 MG/2ML IJ SOLN
4.0000 mg | Freq: Once | INTRAMUSCULAR | Status: AC
  Administered 2013-12-06: 4 mg via INTRAVENOUS
  Filled 2013-12-06: qty 2

## 2013-12-06 MED ORDER — SODIUM CHLORIDE 0.9 % IV SOLN
Freq: Once | INTRAVENOUS | Status: AC
Start: 2013-12-06 — End: 2013-12-06
  Administered 2013-12-06: 02:00:00 via INTRAVENOUS

## 2013-12-06 NOTE — ED Notes (Signed)
Bed: RD40 Expected date:  Expected time:  Means of arrival:  Comments: 79 yo M  Neck pain

## 2013-12-06 NOTE — Discharge Instructions (Signed)
Please take the medication prescribed. Please ice and heat the area well - you have bad spasms, and it will take time for it to get better.   Muscle Cramps and Spasms Muscle cramps and spasms occur when a muscle or muscles tighten and you have no control over this tightening (involuntary muscle contraction). They are a common problem and can develop in any muscle. The most common place is in the calf muscles of the leg. Both muscle cramps and muscle spasms are involuntary muscle contractions, but they also have differences:   Muscle cramps are sporadic and painful. They may last a few seconds to a quarter of an hour. Muscle cramps are often more forceful and last longer than muscle spasms.  Muscle spasms may or may not be painful. They may also last just a few seconds or much longer. CAUSES  It is uncommon for cramps or spasms to be due to a serious underlying problem. In many cases, the cause of cramps or spasms is unknown. Some common causes are:   Overexertion.   Overuse from repetitive motions (doing the same thing over and over).   Remaining in a certain position for a long period of time.   Improper preparation, form, or technique while performing a sport or activity.   Dehydration.   Injury.   Side effects of some medicines.   Abnormally low levels of the salts and ions in your blood (electrolytes), especially potassium and calcium. This could happen if you are taking water pills (diuretics) or you are pregnant.  Some underlying medical problems can make it more likely to develop cramps or spasms. These include, but are not limited to:   Diabetes.   Parkinson disease.   Hormone disorders, such as thyroid problems.   Alcohol abuse.   Diseases specific to muscles, joints, and bones.   Blood vessel disease where not enough blood is getting to the muscles.  HOME CARE INSTRUCTIONS   Stay well hydrated. Drink enough water and fluids to keep your urine clear  or pale yellow.  It may be helpful to massage, stretch, and relax the affected muscle.  For tight or tense muscles, use a warm towel, heating pad, or hot shower water directed to the affected area.  If you are sore or have pain after a cramp or spasm, applying ice to the affected area may relieve discomfort.  Put ice in a plastic bag.  Place a towel between your skin and the bag.  Leave the ice on for 15-20 minutes, 03-04 times a day.  Medicines used to treat a known cause of cramps or spasms may help reduce their frequency or severity. Only take over-the-counter or prescription medicines as directed by your caregiver. SEEK MEDICAL CARE IF:  Your cramps or spasms get more severe, more frequent, or do not improve over time.  MAKE SURE YOU:   Understand these instructions.  Will watch your condition.  Will get help right away if you are not doing well or get worse. Document Released: 04/12/2002 Document Revised: 02/15/2013 Document Reviewed: 10/07/2012 [facility] Patient Information 2014 [place], [place].  Heat Therapy Heat therapy can help make painful, stiff muscles and joints feel better. Do not use heat on new injuries. Wait at least 48 hours after an injury to use heat. Do not use heat when you have aches or pains right after an activity. If you still have pain 3 hours after stopping the activity, then you may use heat. HOME CARE Wet heat pack  Soak  a clean towel in warm water. Squeeze out the extra water.  Put the warm, wet towel in a plastic bag.  Place a thin, dry towel between your skin and the bag.  Put the heat pack on the area for 5 minutes, and check your skin. Your skin may be pink, but it should not be red.  Leave the heat pack on the area for 15 to 30 minutes.  Repeat this every 2 to 4 hours while awake. Do not use heat while you are sleeping. Warm water bath  Fill a tub with warm water.  Place the affected body part in the tub.  Soak the area for 20 to 40  minutes.  Repeat as needed. Hot water bottle  Fill the water bottle half full with hot water.  Press out the extra air. Close the cap tightly.  Place a dry towel between your skin and the bottle.  Put the bottle on the area for 5 minutes, and check your skin. Your skin may be pink, but it should not be red.  Leave the bottle on the area for 15 to 30 minutes.  Repeat this every 2 to 4 hours while awake. Electric heating pad  Place a dry towel between your skin and the heating pad.  Set the heating pad on low heat.  Put the heating pad on the area for 10 minutes, and check your skin. Your skin may be pink, but it should not be red.  Leave the heating pad on the area for 20 to 40 minutes.  Repeat this every 2 to 4 hours while awake.  Do not lie on the heating pad.  Do not fall asleep while using the heating pad.  Do not use the heating pad near water. GET HELP RIGHT AWAY IF:  You get blisters or red skin.  Your skin is puffy (swollen), or you lose feeling (numbness) in the affected area.  You have any new problems.  Your problems are getting worse.  You have any questions or concerns. If you have any problems, stop using heat therapy until you see your doctor. MAKE SURE YOU:  Understand these instructions.  Will watch your condition.  Will get help right away if you are not doing well or get worse. Document Released: 01/13/2012 Document Reviewed: 01/13/2012 [facility] Patient Information 2014 [place]. Torticollis, Acute You have suddenly (acutely) developed a twisted neck (torticollis). This is usually a self-limited condition. CAUSES  Acute torticollis may be caused by malposition, trauma or infection. Most commonly, acute torticollis is caused by sleeping in an awkward position. Torticollis may also be caused by the flexion, extension or twisting of the neck muscles beyond their normal position. Sometimes, the exact cause may not be known. SYMPTOMS    Usually, there is pain and limited movement of the neck. Your neck may twist to one side. DIAGNOSIS  The diagnosis is often made by physical examination. X-rays, CT scans or MRIs may be done if there is a history of trauma or concern of infection. TREATMENT  For a common, stiff neck that develops during sleep, treatment is focused on relaxing the contracted neck muscle. Medications (including shots) may be used to treat the problem. Most cases resolve in several days. Torticollis usually responds to conservative physical therapy. If left untreated, the shortened and spastic neck muscle can cause deformities in the face and neck. Rarely, surgery is required. HOME CARE INSTRUCTIONS   Use over-the-counter and prescription medications as directed by your caregiver.  Do stretching exercises and massage the neck as directed by your caregiver.  Follow up with physical therapy if needed and as directed by your caregiver. SEEK IMMEDIATE MEDICAL CARE IF:   You develop difficulty breathing or noisy breathing (stridor).  You drool, develop trouble swallowing or have pain with swallowing.  You develop numbness or weakness in the hands or feet.  You have changes in speech or vision.  You have problems with urination or bowel movements.  You have difficulty walking.  You have a fever.  You have increased pain. MAKE SURE YOU:   Understand these instructions.  Will watch your condition.  Will get help right away if you are not doing well or get worse. Document Released: 10/18/2000 Document Revised: 01/13/2012 Document Reviewed: 11/29/2009 [facility] Patient Information 2014 [place], [place].

## 2013-12-07 DIAGNOSIS — M5030 Other cervical disc degeneration, unspecified cervical region: Secondary | ICD-10-CM | POA: Diagnosis not present

## 2013-12-08 ENCOUNTER — Encounter: Payer: Self-pay | Admitting: *Deleted

## 2013-12-08 NOTE — ED Provider Notes (Signed)
CSN: [id number]     Arrival date & time 12/05/13  2358 History   First MD Initiated Contact with Patient 12/06/13 [phone]     Chief Complaint  Patient presents with  . Neck Pain   (Consider location/radiation/quality/duration/timing/severity/associated sxs/prior Treatment) HPI Comments: Pt arrived to the ED via EMS with a complaint of neck pain.  Pt was seen here this morning for same complaint.  Pt was prescribed valium for pain which he filled.  Pt states that the pain is unresolved by medication.  Pt states he is unable to move his neck or lie down comfortably.  Pt has a hx of hypertension and diabetes. Neck pain is located on the sides, and is worse with any neck movement. He also has a headache. Reports hitting his head onto a car door prior to his sx starting - something he forgot to mention the ED doctor earlier - and he thinks his pain started since then. Pt has no no n/v/f/c.visual complains/dizziness/numbness/tingling.   Patient is a 79 y.o. male presenting with neck pain. The history is provided by the patient.  Neck Pain Associated symptoms: headaches   Associated symptoms: no chest pain     Past Medical History  Diagnosis Date  . CAD (coronary artery disease)     cath 01/12 no CAD, no records of reported silent MI in [facility], in [place], normal [place] 2007, EF 11%, mild diastolic dysfunction  . 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 [name] Comes  . Hyperlipidemia   . Hypertension   . GERD (gastroesophageal reflux disease)   . Meningioma     involves right optic nerve  . Chronic constipation     GI referral to Dr. Oletta Lamas in the past, pt did not go  . Idiopathic acute pancreatitis   . Syncopal episodes   . Renal insufficiency     creatinine ~1.2 in the past  . H. pylori infection     PUD association  . History of alcohol abuse   . Glaucoma, open angle   . Cervical spondylarthritis   . Tinea  cruris    Past Surgical History  Procedure Laterality Date  . Left hip replacement    . Cataract extraction     History reviewed. No pertinent family history. History  Substance Use Topics  . Smoking status: Never Smoker   . Smokeless tobacco: Not on file  . Alcohol Use: No     Comment: stopped 1974    Review of Systems  Constitutional: Negative for activity change and appetite change.  Respiratory: Negative for cough and shortness of breath.   Cardiovascular: Negative for chest pain.  Gastrointestinal: Negative for abdominal pain.  Genitourinary: Negative for dysuria.  Musculoskeletal: Positive for neck pain and neck stiffness.  Neurological: Positive for headaches.    Allergies  Review of patient's allergies indicates no known allergies.  Home Medications   Current Outpatient Rx  Name  Route  Sig  Dispense  Refill  . aspirin 81 MG tablet   Oral   Take 81 mg by mouth daily.          . clonazePAM (KLONOPIN) 0.5 MG tablet   Oral   Take 0.5 mg by mouth 2 (two) times daily as needed. For anxiety         . diazepam (VALIUM) 5 MG tablet   Oral   Take 1 tablet (5 mg total) by mouth every  6 (six) hours as needed for muscle spasms.   15 tablet   0   . metFORMIN (GLUCOPHAGE-XR) 500 MG 24 hr tablet   Oral   Take 500 mg by mouth at bedtime.          Marland Kitchen omeprazole (PRILOSEC) 40 MG capsule   Oral   Take 40 mg by mouth daily.           Marland Kitchen OVER THE COUNTER MEDICATION   Oral   Take 1 tablet by mouth 2 (two) times daily. Super beta prostate         . OVER THE COUNTER MEDICATION   Oral   Take 1 capsule by mouth 2 (two) times daily. inflamma-less otc         . OVER THE COUNTER MEDICATION   Oral   Take 1 tablet by mouth daily. Ultimate colon care         . oxyCODONE (ROXICODONE) 15 MG immediate release tablet   Oral   Take 15 mg by mouth every 6 (six) hours as needed. For pain         . rosuvastatin (CRESTOR) 20 MG tablet   Oral   Take 20 mg by mouth  daily.           . diazepam (VALIUM) 5 MG tablet   Oral   Take 1 tablet (5 mg total) by mouth 2 (two) times daily.   10 tablet   0   . ibuprofen (ADVIL,MOTRIN) 400 MG tablet   Oral   Take 1 tablet (400 mg total) by mouth every 6 (six) hours as needed.   30 tablet   0    BP 168/74  Pulse 69  Resp 17  SpO2 96% Physical Exam  Nursing note and vitals reviewed. Constitutional: He is oriented to person, place, and time. He appears well-developed.  HENT:  Head: Normocephalic and atraumatic.  Eyes: Conjunctivae and EOM are normal. Pupils are equal, round, and reactive to light.  Neck: Normal range of motion. Neck supple.  Cardiovascular: Normal rate and regular rhythm.   Pulmonary/Chest: Effort normal and breath sounds normal.  Abdominal: Soft. Bowel sounds are normal. He exhibits no distension. There is no tenderness. There is no rebound and no guarding.  Musculoskeletal:  Reproducible tenderness over the paraspinal region -cspine, and also bony part of the cspine.  Neurological: He is alert and oriented to person, place, and time. No cranial nerve deficit. Coordination normal.  Skin: Skin is warm.    ED Course  Procedures (including critical care time) Labs Review Labs Reviewed  CBC WITH DIFFERENTIAL - Abnormal; Notable for the following:    WBC 11.7 (*)    Platelets 127 (*)    Neutro Abs 8.5 (*)    Monocytes Absolute 1.2 (*)    All other components within normal limits  BASIC METABOLIC PANEL - Abnormal; Notable for the following:    Glucose, Bld 142 (*)    GFR calc non Af Amer 66 (*)    GFR calc Af Amer 77 (*)    All other components within normal limits   Imaging Review No results found.  EKG Interpretation   None       MDM   1. Spasm of cervical paraspinous muscle    Pt returns to the ER with headache and neck pain. States that sx worse. Now reports traumatic incident prior to the sx - CT head and cspine ordered -and are neg. There is paraspinal  tenderness, and  sx worse with palpation and with turning of the head. Neuro exam is non focal - dont suspect dissection/ thrombosis.  Will give neuro f/u, in case sx dont resolve with valium.   Varney Biles, MD 12/08/13 [phone]

## 2013-12-13 ENCOUNTER — Encounter: Payer: Self-pay | Admitting: Internal Medicine

## 2013-12-26 ENCOUNTER — Emergency Department (HOSPITAL_COMMUNITY)
Admission: EM | Admit: 2013-12-26 | Discharge: 2013-12-26 | Disposition: A | Discharge status: Home / Self Care | Payer: Medicare Other | Attending: Emergency Medicine | Admitting: Emergency Medicine

## 2013-12-26 ENCOUNTER — Encounter (HOSPITAL_COMMUNITY): Payer: Self-pay | Admitting: Emergency Medicine

## 2013-12-26 DIAGNOSIS — K219 Gastro-esophageal reflux disease without esophagitis: Secondary | ICD-10-CM | POA: Diagnosis not present

## 2013-12-26 DIAGNOSIS — Z87448 Personal history of other diseases of urinary system: Secondary | ICD-10-CM | POA: Diagnosis not present

## 2013-12-26 DIAGNOSIS — Z8619 Personal history of other infectious and parasitic diseases: Secondary | ICD-10-CM | POA: Insufficient documentation

## 2013-12-26 DIAGNOSIS — I2510 Atherosclerotic heart disease of native coronary artery without angina pectoris: Secondary | ICD-10-CM | POA: Diagnosis not present

## 2013-12-26 DIAGNOSIS — E785 Hyperlipidemia, unspecified: Secondary | ICD-10-CM | POA: Diagnosis not present

## 2013-12-26 DIAGNOSIS — Z8661 Personal history of infections of the central nervous system: Secondary | ICD-10-CM | POA: Diagnosis not present

## 2013-12-26 DIAGNOSIS — M5412 Radiculopathy, cervical region: Secondary | ICD-10-CM | POA: Diagnosis not present

## 2013-12-26 DIAGNOSIS — R51 Headache: Secondary | ICD-10-CM | POA: Diagnosis not present

## 2013-12-26 DIAGNOSIS — M542 Cervicalgia: Secondary | ICD-10-CM | POA: Diagnosis not present

## 2013-12-26 DIAGNOSIS — Z79899 Other long term (current) drug therapy: Secondary | ICD-10-CM | POA: Diagnosis not present

## 2013-12-26 DIAGNOSIS — Z7982 Long term (current) use of aspirin: Secondary | ICD-10-CM | POA: Diagnosis not present

## 2013-12-26 DIAGNOSIS — Z8669 Personal history of other diseases of the nervous system and sense organs: Secondary | ICD-10-CM | POA: Insufficient documentation

## 2013-12-26 DIAGNOSIS — I10 Essential (primary) hypertension: Secondary | ICD-10-CM | POA: Insufficient documentation

## 2013-12-26 MED ORDER — DIAZEPAM 5 MG PO TABS: 5.0000 mg | ORAL_TABLET | Freq: Two times a day (BID) | ORAL | Status: DC

## 2013-12-26 MED ORDER — HYDROMORPHONE HCL PF 1 MG/ML IJ SOLN
1.0000 mg | Freq: Once | INTRAMUSCULAR | Status: AC
  Administered 2013-12-26: 1 mg via INTRAMUSCULAR
  Filled 2013-12-26: qty 1

## 2013-12-26 MED ORDER — OXYCODONE HCL 5 MG PO TABS: 10.0000 mg | ORAL_TABLET | ORAL | Status: DC | PRN

## 2013-12-26 NOTE — ED Provider Notes (Signed)
CSN: [id number]     Arrival date & time 12/26/13  1257 History   First MD Initiated Contact with Patient 12/26/13 1311     Chief Complaint  Patient presents with  . Generalized Body Aches     (Consider location/radiation/quality/duration/timing/severity/associated sxs/prior Treatment) HPI Comments: Antonio Le is a 79  year-old male with a past medical history of CAD, presenting the Emergency Department with a chief complaint of neck and upper extremity pain for several weeks.  He reports onset after Dr. Nelva Bush gave him injections.  He reports constant neck pain with radiation to upper extremities.  He reports increase of pain with movement of he neck and upper extremities. The pateint also complains of ongoing associated headache.  He denies previous injury.   PCP: [name], MD Ortho: Dr. Nelva Bush    The history is provided by the patient and medical records. No language interpreter was used.    Past Medical History  Diagnosis Date  . CAD (coronary artery disease)     cath 01/12 no CAD, no records of reported silent MI in [facility], in [place], normal [place] 2007, EF 67%, mild diastolic dysfunction  . 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 [name] Comes  . Hyperlipidemia   . Hypertension   . GERD (gastroesophageal reflux disease)   . Meningioma     involves right optic nerve  . Chronic constipation     GI referral to Dr. Oletta Lamas in the past, pt did not go  . Idiopathic acute pancreatitis   . Syncopal episodes   . Renal insufficiency     creatinine ~1.2 in the past  . H. pylori infection     PUD association  . History of alcohol abuse   . Glaucoma, open angle   . Cervical spondylarthritis   . Tinea cruris    Past Surgical History  Procedure Laterality Date  . Left hip replacement    . Cataract extraction     No family history on file. History  Substance Use Topics  . Smoking status:  Never Smoker   . Smokeless tobacco: Not on file  . Alcohol Use: No     Comment: stopped 1974    Review of Systems  Constitutional: Negative for fever and chills.  Respiratory: Negative for shortness of breath.   Cardiovascular: Negative for chest pain.  Gastrointestinal: Negative for abdominal pain.  Musculoskeletal: Positive for neck pain and neck stiffness. Negative for back pain, gait problem and joint swelling.  Skin: Negative for rash.  Neurological: Positive for weakness and headaches.      Allergies  Review of patient's allergies indicates no known allergies.  Home Medications   Current Outpatient Rx  Name  Route  Sig  Dispense  Refill  . aspirin 81 MG tablet   Oral   Take 81 mg by mouth daily.          . clonazePAM (KLONOPIN) 0.5 MG tablet   Oral   Take 0.5 mg by mouth 2 (two) times daily as needed. For anxiety         . diazepam (VALIUM) 5 MG tablet   Oral   Take 1 tablet (5 mg total) by mouth every 6 (six) hours as needed for muscle spasms.   15 tablet   0   . diazepam (VALIUM) 5 MG tablet   Oral   Take 1 tablet (5 mg total) by mouth 2 (two)  times daily.   10 tablet   0   . ibuprofen (ADVIL,MOTRIN) 400 MG tablet   Oral   Take 1 tablet (400 mg total) by mouth every 6 (six) hours as needed.   30 tablet   0   . metFORMIN (GLUCOPHAGE-XR) 500 MG 24 hr tablet   Oral   Take 500 mg by mouth at bedtime.          Marland Kitchen omeprazole (PRILOSEC) 40 MG capsule   Oral   Take 40 mg by mouth daily.           Marland Kitchen OVER THE COUNTER MEDICATION   Oral   Take 1 tablet by mouth 2 (two) times daily. Super beta prostate         . OVER THE COUNTER MEDICATION   Oral   Take 1 capsule by mouth 2 (two) times daily. inflamma-less otc         . OVER THE COUNTER MEDICATION   Oral   Take 1 tablet by mouth daily. Ultimate colon care         . oxyCODONE (ROXICODONE) 15 MG immediate release tablet   Oral   Take 15 mg by mouth every 6 (six) hours as needed. For  pain         . rosuvastatin (CRESTOR) 20 MG tablet   Oral   Take 20 mg by mouth daily.            BP 133/69  Pulse 66  Temp(Src) 98.3 F (36.8 C) (Oral)  Resp 16  Wt 168 lb (76.204 kg)  SpO2 98% Physical Exam  Nursing note and vitals reviewed. Constitutional: He is oriented to person, place, and time. He appears well-developed and well-nourished. No distress.  HENT:  Head: Normocephalic and atraumatic.  Eyes: Pupils are equal, round, and reactive to light.  Neck: Neck supple. Muscular tenderness present. Decreased range of motion present.  Decrease active ROM secondary to pain.  Positive Spurling's test bilaterally.  Head held in slight flexion.  Cardiovascular: Normal rate and regular rhythm.   Pulmonary/Chest: Effort normal and breath sounds normal.  Abdominal: Soft.  Neurological: He is alert and oriented to person, place, and time. He has normal strength. No sensory deficit.  Reflex Scores:      Bicep reflexes are 1+ on the right side and 2+ on the left side. Skin: Skin is warm and dry. No rash noted.    ED Course  Procedures (including critical care time) Labs Review Labs Reviewed - No data to display Imaging Review No results found.  EKG Interpretation   None       MDM   Final diagnoses:  Cervical radicular pain   Pt with non-traumatic pain to neck and upper extremities.  Positive Spurling's test, likely cervical radiculopathy.  Afebrile, doubt meningitis or subarachnoid.CT head and C-spine performed 12/06/2013. I do not feel that further is necessary at this time.   EMR shows: CT CERVICAL SPINE FINDINGS: No evidence of acute fracture or traumatic subluxation. No prevertebral edema or gross cervical canal hematoma. There is C4-5 non segmentation. Diffuse spondylotic change. There is bulky dorsal ligamentous ossification at C5-6, C6-7, and C7-T1, narrowing the spinal canal from behind. Posterior ridging present at the same levels, further contributing to  canal stenosis. CT head -without acute findings. Discussed patient history, condition, and labs with Dr. Ashok Cordia who agrees the patient can be evaluated as an out-pt. Will have the patient follow up with Dr. Nelva Bush this week. Discussed previous imaging  results, and treatment plan with the patient. Return precautions given. Reports understanding and no other concerns at this time.  Patient is stable for discharge at this time.   Meds given in ED:  Medications  HYDROmorphone (DILAUDID) injection 1 mg (1 mg Intramuscular Given 12/26/13 1405)    Discharge Medication List as of 12/26/2013  2:28 PM    START taking these medications   Details  diazepam (VALIUM) 5 MG tablet Take 1 tablet (5 mg total) by mouth 2 (two) times daily., Starting 12/26/2013, Until Discontinued, Print    !! oxyCODONE (ROXICODONE) 5 MG immediate release tablet Take 2 tablets (10 mg total) by mouth every 4 (four) hours as needed for severe pain., Starting 12/26/2013, Until Discontinued, Print     !! - Potential duplicate medications found. Please discuss with provider.         Lorrine Kin, PA-C 12/27/13 2028

## 2013-12-26 NOTE — ED Notes (Signed)
Pt states he is having severe pain all over his body.  Pt states "there is pain in every bone of my body".  Pt states he's been getting injections for pain.  Pt states he was seen recently at [facility], but nothing was found to be wrong.  Pt states he's aching and hurting all over.

## 2013-12-26 NOTE — ED Notes (Signed)
Pt discharged.Vital signs stable and GCS 15 

## 2013-12-26 NOTE — Discharge Instructions (Signed)
Call Dr. Nelva Bush for an appointment this week for further evaluation of your neck pain. Call for a follow up appointment with a Family or Primary Care Provider.  Return if Symptoms worsen.   Take medication as prescribed.

## 2013-12-28 NOTE — ED Provider Notes (Signed)
Medical screening examination/treatment/procedure(s) were conducted as a shared visit with non-physician practitioner(s) and myself.  I personally evaluated the patient during the encounter.  EKG Interpretation   None       Pt c/o chronic neck pain, hx multiple injections in past. No injury. No numbness/weakness. Spine nt.   Mirna Mires, MD 12/28/13 [phone]

## 2014-01-01 DIAGNOSIS — Z79899 Other long term (current) drug therapy: Secondary | ICD-10-CM | POA: Diagnosis not present

## 2014-01-01 DIAGNOSIS — G894 Chronic pain syndrome: Secondary | ICD-10-CM | POA: Diagnosis not present

## 2014-01-01 DIAGNOSIS — M5030 Other cervical disc degeneration, unspecified cervical region: Secondary | ICD-10-CM | POA: Diagnosis not present

## 2014-01-21 DIAGNOSIS — M76899 Other specified enthesopathies of unspecified lower limb, excluding foot: Secondary | ICD-10-CM | POA: Diagnosis not present

## 2014-01-21 DIAGNOSIS — M5030 Other cervical disc degeneration, unspecified cervical region: Secondary | ICD-10-CM | POA: Diagnosis not present

## 2014-01-21 DIAGNOSIS — M25569 Pain in unspecified knee: Secondary | ICD-10-CM | POA: Diagnosis not present

## 2014-01-24 ENCOUNTER — Encounter: Payer: Medicare Other | Admitting: Internal Medicine

## 2014-01-28 ENCOUNTER — Encounter: Payer: Self-pay | Admitting: Internal Medicine

## 2014-01-28 ENCOUNTER — Ambulatory Visit (INDEPENDENT_AMBULATORY_CARE_PROVIDER_SITE_OTHER): Payer: Medicare Other | Admitting: Internal Medicine

## 2014-01-28 VITALS — BP 140/80 | HR 52 | Ht 65.0 in | Wt 170.8 lb

## 2014-01-28 DIAGNOSIS — Z950 Presence of cardiac pacemaker: Secondary | ICD-10-CM | POA: Diagnosis not present

## 2014-01-28 DIAGNOSIS — I442 Atrioventricular block, complete: Secondary | ICD-10-CM | POA: Diagnosis not present

## 2014-01-28 NOTE — Progress Notes (Signed)
Patient Care Team: [name], MD as PCP - General (Internal Medicine)   HPI  Antonio Le is a 79 y.o. male Seen in follow for pacemaker implanted 2012  for sinus node dysfunction and progressive conduction system disease with ultimate complete heart block implanted    The procedure was Complicated by adhesive capsulitis this is somewhat improved. He also continues to complain of discomfort at the pacemaker site. He was given a clean by Dr. Annamary Carolin which was significantly beneficial a quite costly. He has not been enthusiastic about the knee $18 for a small tube.  He went and saw a physician in [place]. Recommendation was to not doing a good. He and  Is also having problems with exercise tolerance and underwent Myoview scanning /14. This was normal.     Catheterization January 2012 demonstrated no ischemia. There is no peripheral edema orthopnea.   Past Medical History  Diagnosis Date  . CAD (coronary artery disease)     cath 01/12 no CAD, no records of reported silent MI in [facility], in [place], normal [place] 2007, EF 41%, mild diastolic dysfunction  . 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 [name] Comes  . Hyperlipidemia   . Hypertension   . GERD (gastroesophageal reflux disease)   . Meningioma     involves right optic nerve  . Chronic constipation     GI referral to Dr. Oletta Lamas in the past, pt did not go  . Idiopathic acute pancreatitis   . Syncopal episodes   . Renal insufficiency     creatinine ~1.2 in the past  . H. pylori infection     PUD association  . History of alcohol abuse   . Glaucoma, open angle   . Cervical spondylarthritis   . Tinea cruris     Past Surgical History  Procedure Laterality Date  . Left hip replacement    . Cataract extraction      Current Outpatient Prescriptions  Medication Sig Dispense Refill  . aspirin 81 MG tablet Take 81 mg by mouth daily.        . clonazePAM (KLONOPIN) 0.5 MG tablet Take 1 mg by mouth at bedtime. For anxiety      . darifenacin (ENABLEX) 15 MG 24 hr tablet Take 15 mg by mouth daily.      . diazepam (VALIUM) 5 MG tablet Take 1 tablet (5 mg total) by mouth 2 (two) times daily.  10 tablet  0  . metFORMIN (GLUCOPHAGE-XR) 500 MG 24 hr tablet Take 500 mg by mouth at bedtime.       Marland Kitchen omeprazole (PRILOSEC) 40 MG capsule Take 40 mg by mouth daily.       Marland Kitchen oxyCODONE (ROXICODONE) 5 MG immediate release tablet Take 2 tablets (10 mg total) by mouth every 4 (four) hours as needed for severe pain.  15 tablet  0  . rosuvastatin (CRESTOR) 20 MG tablet Take 10 mg by mouth daily.       . vitamin C (ASCORBIC ACID) 500 MG tablet Take 500 mg by mouth daily.       No current facility-administered medications for this visit.    No Known Allergies  Review of Systems negative except from HPI and PMH  Physical Exam BP 140/80  Pulse 52  Ht 5\' 5"  (1.651 m)  Wt 170 lb 12.8 oz (77.474 kg)  BMI 28.42 kg/m2 Well developed and  well nourished in no acute distress HENT normal E scleral and icterus clear Neck Supple JVP flat; carotids brisk and full Clear to ausculation Device pocket well healed; without hematoma or erythema.  There is no tethering  there is a small keloid. There is tenderness at the lateral aspect of the keloid *Regular rate and rhythm, no murmurs gallops or rub Soft with active bowel sounds No clubbing cyanosis  Edema Alert and oriented, grossly normal motor and sensory function; he walks with a cane Skin Warm and Dry  ECG demonstrates AV pacing  Assessment and  Plan  Complete heart block  Pacer-Medtronic The patient's device was interrogated.  The information was reviewed. No changes were made in the programming.     Pacemaker incision discomfort   Antonio Le is stable. Continue on his current medications. I've encouraged him to try to get this medication that helped on the Internet as opposed to the health  food store. Maybe will be cheaper.

## 2014-01-28 NOTE — Patient Instructions (Signed)

## 2014-01-31 LAB — MDC_IDC_ENUM_SESS_TYPE_INCLINIC
Battery Voltage: 3 V
Brady Statistic AP VP Percent: 55.73 %
Brady Statistic AP VS Percent: 0 %
Brady Statistic AS VP Percent: 44.26 %
Brady Statistic RA Percent Paced: 55.73 %
Date Time Interrogation Session: 20150327161652
Lead Channel Impedance Value: 656 Ohm
Lead Channel Pacing Threshold Amplitude: 0.5 V
Lead Channel Pacing Threshold Amplitude: 1 V
Lead Channel Pacing Threshold Pulse Width: 0.4 ms
Lead Channel Sensing Intrinsic Amplitude: 2 mV
Lead Channel Sensing Intrinsic Amplitude: 8.5 mV
Lead Channel Setting Pacing Amplitude: 2 V
Lead Channel Setting Pacing Amplitude: 2.5 V
Lead Channel Setting Pacing Pulse Width: 0.4 ms
MDC IDC MSMT LEADCHNL RV IMPEDANCE VALUE: 416 Ohm
MDC IDC MSMT LEADCHNL RV PACING THRESHOLD PULSEWIDTH: 0.4 ms
MDC IDC SET LEADCHNL RV SENSING SENSITIVITY: 2.1 mV
MDC IDC STAT BRADY AS VS PERCENT: 0.02 %
MDC IDC STAT BRADY RV PERCENT PACED: 99.98 %
Zone Setting Detection Interval: 350 ms
Zone Setting Detection Interval: 400 ms

## 2014-02-01 ENCOUNTER — Encounter: Payer: Self-pay | Admitting: Internal Medicine

## 2014-03-03 DIAGNOSIS — M5030 Other cervical disc degeneration, unspecified cervical region: Secondary | ICD-10-CM | POA: Diagnosis not present

## 2014-03-03 DIAGNOSIS — G894 Chronic pain syndrome: Secondary | ICD-10-CM | POA: Diagnosis not present

## 2014-04-13 DIAGNOSIS — Z1211 Encounter for screening for malignant neoplasm of colon: Secondary | ICD-10-CM | POA: Diagnosis not present

## 2014-04-25 DIAGNOSIS — Z1211 Encounter for screening for malignant neoplasm of colon: Secondary | ICD-10-CM | POA: Diagnosis not present

## 2014-05-02 ENCOUNTER — Telehealth: Payer: Self-pay | Admitting: Cardiology

## 2014-05-02 ENCOUNTER — Ambulatory Visit (INDEPENDENT_AMBULATORY_CARE_PROVIDER_SITE_OTHER): Payer: Medicare Other | Admitting: *Deleted

## 2014-05-02 DIAGNOSIS — I442 Atrioventricular block, complete: Secondary | ICD-10-CM | POA: Diagnosis not present

## 2014-05-02 NOTE — Telephone Encounter (Signed)
Spoke with pt and reminded pt of remote transmission that is due today. Pt verbalized understanding.   

## 2014-05-02 NOTE — Progress Notes (Signed)
Remote pacemaker transmission.   

## 2014-05-04 LAB — MDC_IDC_ENUM_SESS_TYPE_REMOTE
Brady Statistic AP VP Percent: 59.87 %
Brady Statistic AP VS Percent: 0 %
Brady Statistic AS VP Percent: 40.11 %
Brady Statistic AS VS Percent: 0.02 %
Brady Statistic RA Percent Paced: 59.87 %
Lead Channel Impedance Value: 416 Ohm
Lead Channel Impedance Value: 640 Ohm
Lead Channel Sensing Intrinsic Amplitude: 1.9 mV
Lead Channel Setting Pacing Amplitude: 2 V
Lead Channel Setting Pacing Amplitude: 2.5 V
Lead Channel Setting Pacing Pulse Width: 0.4 ms
Lead Channel Setting Sensing Sensitivity: 2.1 mV
MDC IDC MSMT BATTERY VOLTAGE: 2.99 V
MDC IDC SESS DTM: 20150629164555
MDC IDC SET ZONE DETECTION INTERVAL: 400 ms
MDC IDC STAT BRADY RV PERCENT PACED: 99.98 %
Zone Setting Detection Interval: 350 ms

## 2014-05-05 DIAGNOSIS — R300 Dysuria: Secondary | ICD-10-CM | POA: Diagnosis not present

## 2014-05-18 ENCOUNTER — Encounter: Payer: Self-pay | Admitting: Cardiology

## 2014-05-24 ENCOUNTER — Encounter: Payer: Self-pay | Admitting: Internal Medicine

## 2014-05-26 DIAGNOSIS — E119 Type 2 diabetes mellitus without complications: Secondary | ICD-10-CM | POA: Diagnosis not present

## 2014-05-31 DIAGNOSIS — F411 Generalized anxiety disorder: Secondary | ICD-10-CM | POA: Diagnosis not present

## 2014-05-31 DIAGNOSIS — E785 Hyperlipidemia, unspecified: Secondary | ICD-10-CM | POA: Diagnosis not present

## 2014-05-31 DIAGNOSIS — Z0000 Encounter for general adult medical examination without abnormal findings: Secondary | ICD-10-CM | POA: Diagnosis not present

## 2014-05-31 DIAGNOSIS — I10 Essential (primary) hypertension: Secondary | ICD-10-CM | POA: Diagnosis not present

## 2014-06-28 DIAGNOSIS — K219 Gastro-esophageal reflux disease without esophagitis: Secondary | ICD-10-CM | POA: Diagnosis not present

## 2014-06-28 DIAGNOSIS — J029 Acute pharyngitis, unspecified: Secondary | ICD-10-CM | POA: Diagnosis not present

## 2014-06-28 DIAGNOSIS — R490 Dysphonia: Secondary | ICD-10-CM | POA: Diagnosis not present

## 2014-06-30 DIAGNOSIS — Z79899 Other long term (current) drug therapy: Secondary | ICD-10-CM | POA: Diagnosis not present

## 2014-06-30 DIAGNOSIS — M47812 Spondylosis without myelopathy or radiculopathy, cervical region: Secondary | ICD-10-CM | POA: Diagnosis not present

## 2014-06-30 DIAGNOSIS — M47817 Spondylosis without myelopathy or radiculopathy, lumbosacral region: Secondary | ICD-10-CM | POA: Diagnosis not present

## 2014-08-01 ENCOUNTER — Ambulatory Visit (INDEPENDENT_AMBULATORY_CARE_PROVIDER_SITE_OTHER): Payer: Medicare Other | Admitting: *Deleted

## 2014-08-01 DIAGNOSIS — I442 Atrioventricular block, complete: Secondary | ICD-10-CM | POA: Diagnosis not present

## 2014-08-01 LAB — MDC_IDC_ENUM_SESS_TYPE_REMOTE
Battery Voltage: 2.99 V
Brady Statistic AP VP Percent: 60.2 %
Brady Statistic AP VS Percent: 0 %
Brady Statistic AS VS Percent: 0.01 %
Brady Statistic RA Percent Paced: 60.2 %
Date Time Interrogation Session: 20150928140436
Lead Channel Impedance Value: 408 Ohm
Lead Channel Sensing Intrinsic Amplitude: 2.395
Lead Channel Setting Pacing Amplitude: 2 V
Lead Channel Setting Pacing Amplitude: 2.5 V
Lead Channel Setting Pacing Pulse Width: 0.4 ms
MDC IDC MSMT LEADCHNL RA IMPEDANCE VALUE: 576 Ohm
MDC IDC SET LEADCHNL RV SENSING SENSITIVITY: 2.1 mV
MDC IDC SET ZONE DETECTION INTERVAL: 350 ms
MDC IDC STAT BRADY AS VP PERCENT: 39.78 %
MDC IDC STAT BRADY RV PERCENT PACED: 99.99 %
Zone Setting Detection Interval: 400 ms

## 2014-08-01 NOTE — Progress Notes (Signed)
Remote pacemaker transmission.   

## 2014-08-10 ENCOUNTER — Telehealth: Payer: Self-pay | Admitting: Internal Medicine

## 2014-08-10 NOTE — Telephone Encounter (Signed)
New message           Pt would like to know if you received his transmission from 9/28

## 2014-08-10 NOTE — Telephone Encounter (Signed)
Spoke w/pt to let know transmission was received.  

## 2014-08-23 ENCOUNTER — Encounter: Payer: Self-pay | Admitting: Cardiology

## 2014-09-09 ENCOUNTER — Encounter: Payer: Self-pay | Admitting: Internal Medicine

## 2014-09-29 DIAGNOSIS — Z23 Encounter for immunization: Secondary | ICD-10-CM | POA: Diagnosis not present

## 2014-10-18 DIAGNOSIS — M47816 Spondylosis without myelopathy or radiculopathy, lumbar region: Secondary | ICD-10-CM | POA: Diagnosis not present

## 2014-10-18 DIAGNOSIS — G894 Chronic pain syndrome: Secondary | ICD-10-CM | POA: Diagnosis not present

## 2014-10-18 DIAGNOSIS — Z79891 Long term (current) use of opiate analgesic: Secondary | ICD-10-CM | POA: Diagnosis not present

## 2014-10-18 DIAGNOSIS — M47812 Spondylosis without myelopathy or radiculopathy, cervical region: Secondary | ICD-10-CM | POA: Diagnosis not present

## 2014-11-02 ENCOUNTER — Ambulatory Visit (INDEPENDENT_AMBULATORY_CARE_PROVIDER_SITE_OTHER): Payer: Medicare Other | Admitting: *Deleted

## 2014-11-02 DIAGNOSIS — I442 Atrioventricular block, complete: Secondary | ICD-10-CM

## 2014-11-02 NOTE — Progress Notes (Signed)
Remote pacemaker transmission.   

## 2014-11-03 LAB — MDC_IDC_ENUM_SESS_TYPE_REMOTE
Brady Statistic AP VP Percent: 59.05 %
Brady Statistic AP VS Percent: 0 %
Brady Statistic AS VP Percent: 40.93 %
Brady Statistic AS VS Percent: 0.01 %
Brady Statistic RV Percent Paced: 99.99 %
Date Time Interrogation Session: 20151230140451
Lead Channel Impedance Value: 416 Ohm
Lead Channel Impedance Value: 640 Ohm
Lead Channel Setting Pacing Amplitude: 2.5 V
Lead Channel Setting Pacing Pulse Width: 0.4 ms
MDC IDC MSMT BATTERY VOLTAGE: 2.99 V
MDC IDC MSMT LEADCHNL RA SENSING INTR AMPL: 2.4 mV
MDC IDC SET LEADCHNL RA PACING AMPLITUDE: 2 V
MDC IDC SET LEADCHNL RV SENSING SENSITIVITY: 2.1 mV
MDC IDC STAT BRADY RA PERCENT PACED: 59.06 %
Zone Setting Detection Interval: 350 ms
Zone Setting Detection Interval: 400 ms

## 2014-11-18 ENCOUNTER — Encounter: Payer: Self-pay | Admitting: Cardiology

## 2014-11-21 ENCOUNTER — Telehealth: Payer: Self-pay | Admitting: Internal Medicine

## 2014-11-21 NOTE — Telephone Encounter (Signed)
Transmissions are being received, patient aware.

## 2014-11-21 NOTE — Telephone Encounter (Signed)
New Msg          Pt states he is concerned that pacer checks are not going through.   Please contact pt about concern, he states he can be reached at home.

## 2014-11-23 ENCOUNTER — Encounter: Payer: Self-pay | Admitting: Internal Medicine

## 2014-12-01 DIAGNOSIS — E118 Type 2 diabetes mellitus with unspecified complications: Secondary | ICD-10-CM | POA: Diagnosis not present

## 2014-12-01 DIAGNOSIS — E785 Hyperlipidemia, unspecified: Secondary | ICD-10-CM | POA: Diagnosis not present

## 2014-12-01 DIAGNOSIS — I10 Essential (primary) hypertension: Secondary | ICD-10-CM | POA: Diagnosis not present

## 2014-12-07 DIAGNOSIS — E780 Pure hypercholesterolemia: Secondary | ICD-10-CM | POA: Diagnosis not present

## 2014-12-07 DIAGNOSIS — Z23 Encounter for immunization: Secondary | ICD-10-CM | POA: Diagnosis not present

## 2014-12-07 DIAGNOSIS — E1165 Type 2 diabetes mellitus with hyperglycemia: Secondary | ICD-10-CM | POA: Diagnosis not present

## 2014-12-07 DIAGNOSIS — N401 Enlarged prostate with lower urinary tract symptoms: Secondary | ICD-10-CM | POA: Diagnosis not present

## 2015-01-16 ENCOUNTER — Encounter: Payer: Self-pay | Admitting: Internal Medicine

## 2015-01-16 ENCOUNTER — Ambulatory Visit (INDEPENDENT_AMBULATORY_CARE_PROVIDER_SITE_OTHER): Payer: Medicare Other | Admitting: Internal Medicine

## 2015-01-16 VITALS — BP 130/68 | HR 53 | Ht 65.0 in | Wt 179.4 lb

## 2015-01-16 DIAGNOSIS — R0609 Other forms of dyspnea: Secondary | ICD-10-CM | POA: Diagnosis not present

## 2015-01-16 DIAGNOSIS — I442 Atrioventricular block, complete: Secondary | ICD-10-CM | POA: Diagnosis not present

## 2015-01-16 DIAGNOSIS — R400 Somnolence: Secondary | ICD-10-CM

## 2015-01-16 DIAGNOSIS — Z45018 Encounter for adjustment and management of other part of cardiac pacemaker: Secondary | ICD-10-CM

## 2015-01-16 LAB — MDC_IDC_ENUM_SESS_TYPE_INCLINIC
Brady Statistic AP VS Percent: 0 %
Brady Statistic AS VP Percent: 40.69 %
Brady Statistic AS VS Percent: 0.01 %
Brady Statistic RA Percent Paced: 59.3 %
Brady Statistic RV Percent Paced: 99.99 %
Lead Channel Impedance Value: 448 Ohm
Lead Channel Impedance Value: 680 Ohm
Lead Channel Pacing Threshold Pulse Width: 0.4 ms
Lead Channel Sensing Intrinsic Amplitude: 2.7498
Lead Channel Setting Pacing Amplitude: 2 V
Lead Channel Setting Pacing Pulse Width: 0.4 ms
MDC IDC MSMT BATTERY VOLTAGE: 2.99 V
MDC IDC MSMT LEADCHNL RA PACING THRESHOLD AMPLITUDE: 1 V
MDC IDC MSMT LEADCHNL RA PACING THRESHOLD PULSEWIDTH: 0.4 ms
MDC IDC MSMT LEADCHNL RV PACING THRESHOLD AMPLITUDE: 1 V
MDC IDC MSMT LEADCHNL RV SENSING INTR AMPL: 9.1056
MDC IDC SESS DTM: 20160314093149
MDC IDC SET LEADCHNL RV PACING AMPLITUDE: 2.5 V
MDC IDC SET LEADCHNL RV SENSING SENSITIVITY: 2.1 mV
MDC IDC SET ZONE DETECTION INTERVAL: 350 ms
MDC IDC STAT BRADY AP VP PERCENT: 59.3 %
Zone Setting Detection Interval: 400 ms

## 2015-01-16 NOTE — Patient Instructions (Addendum)
Your physician recommends that you continue on your current medications as directed. Please refer to the Current Medication list given to you today.  Your physician has requested that you have an echocardiogram. Echocardiography is a painless test that uses sound waves to create images of your heart. It provides your doctor with information about the size and shape of your heart and how well your heart's chambers and valves are working. This procedure takes approximately one hour. There are no restrictions for this procedure.  Remote monitoring is used to monitor your Pacemaker of ICD from home. This monitoring reduces the number of office visits required to check your device to one time per year. It allows [place] to keep an eye on the functioning of your device to ensure it is working properly. You are scheduled for a device check from home on 04/17/15. You may send your transmission at any time that day. If you have a wireless device, the transmission will be sent automatically. After your physician reviews your transmission, you will receive a postcard with your next transmission date.  Your physician wants you to follow-up in: 56 month Dr. Gari Crown will receive a reminder letter in the mail two months in advance. If you don't receive a letter, please call our office to schedule the follow-up appointment.

## 2015-01-16 NOTE — Progress Notes (Signed)
Electrophysiology Office Note   Date:  01/16/2015   ID:  Antonio Le, DOB 10-Sep-1935, MRN [id number]  PCP:  [name], MD  Cardiologist:    Primary Electrophysiologist:    Virl Axe, MD    Chief Complaint  Patient presents with  . Appointment    ROV     History of Present Illness: Antonio Le is a 80 y.o. male is      Seen in follow for pacemaker implanted 2012 for sinus node dysfunction and progressive conduction system disease with ultimate complete heart block     The procedure was Complicated by adhesive capsulitis this is somewhat improved. He also continues to complain of discomfort at the pacemaker site.   Continues to have problems  with exercise tolerance and underwent Myoview scanning /14. Catheterization January 2012 demonstrated no obstructive coronary disease.   There is no peripheral edema orthopnea.  He has significant daytime somnolence. He falls asleep shortly after sitting down. He has sleep disordered breathing.  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 CAD (coronary artery disease)     cath 01/12 no CAD, no records of reported silent MI in [facility], in [place], normal [place] 2007, EF 15%, mild diastolic dysfunction  . 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 [name] Comes  . Hyperlipidemia   . Hypertension   . GERD (gastroesophageal reflux disease)   . Meningioma     involves right optic nerve  . Chronic constipation     GI referral to Dr. Oletta Lamas in the past, pt did not go  . Idiopathic acute pancreatitis   . Syncopal episodes   . Renal insufficiency     creatinine ~1.2 in the past  . H. pylori infection     PUD association  . History of alcohol abuse   . Glaucoma, open angle   . Cervical spondylarthritis   . Tinea cruris    Past  Surgical History  Procedure Laterality Date  . Left hip replacement    . Cataract extraction       Current Outpatient Prescriptions  Medication Sig Dispense Refill  . aspirin 81 MG tablet Take 81 mg by mouth daily.     . clonazePAM (KLONOPIN) 0.5 MG tablet Take 1 mg by mouth at bedtime. For anxiety    . metFORMIN (GLUCOPHAGE-XR) 500 MG 24 hr tablet Take 500 mg by mouth at bedtime.     Marland Kitchen omeprazole (PRILOSEC) 40 MG capsule Take 40 mg by mouth daily.     . rosuvastatin (CRESTOR) 20 MG tablet Take 10 mg by mouth daily.     . tamsulosin (FLOMAX) 0.4 MG CAPS capsule Take 0.4 mg by mouth daily.  0  . vitamin C (ASCORBIC ACID) 500 MG tablet Take 500 mg by mouth daily.     No current facility-administered medications for this visit.    Allergies:   Review of patient's allergies indicates no known allergies.   Social History:  The patient  reports that he has never smoked. He does not have any smokeless tobacco history on file. He reports that he does not drink alcohol or use illicit drugs.   Family History:  The patient's   *family history includes Diabetes in his mother.    ROS:  Please see the  history of present illness and past medical history  Otherwise, review of systems is negative .     PHYSICAL EXAM: VS:  BP 130/68 mmHg  Pulse 53  Ht 5\' 5"  (1.651 m)  Wt 179 lb 6.4 oz (81.375 kg)  BMI 29.85 kg/m2 , BMI Body mass index is 29.85 kg/(m^2). GEN: Well nourished, well developed, in no acute distress HEENT: normal Neck:  JVD flat* *, carotid bruits, or masses Cardiac: REGULAR RATE and RHYTHM ; No murmurs, rubs, +  S4  Back without kyphosis; No CVAT Respiratory:  clear to auscultation bilaterally, normal work of breathing GI: soft, nontender, nondistended, + BS MS: no deformity or atrophy Extremities no clubbing cyanosis   edema Skin: warm and dry,  device pocket is well healed without teathering Neuro:  Strength and sensation are intact Psych: euthymic mood, full affect  EKG:   EKG is ordered today. The ekg ordered today shows AV pacing with  Device interrogation is reviewed today in detail.  See PaceArt for details.  Recent Labs: No results found for requested labs within last 365 days.    Lipid Panel     Component Value Date/Time   CHOL 212* 01/14/2008 2130   TRIG 65 01/14/2008 2130   HDL 63 01/14/2008 2130   CHOLHDL 3.4 Ratio 01/14/2008 2130   VLDL 13 01/14/2008 2130   LDLCALC 136* 01/14/2008 2130     Wt Readings from Last 3 Encounters:  01/16/15 179 lb 6.4 oz (81.375 kg)  01/28/14 170 lb 12.8 oz (77.474 kg)  12/26/13 168 lb (76.204 kg)      Other studies Reviewed: Additional studies/ records that were reviewed today include: none     ASSESSMENT AND PLAN: Complete Heart Block  Pacemaker-Medtronic  The patient's device was interrogated.  The information was reviewed. No changes were made in the programming.     Dyspnea on exertion  Sleep disordered breathing and daytime somnolence  We will plan to undertake an echo to look at left ventricular function given his dyspnea and now 4 years of chronic RV apical pacing. He is euvolemic.  He almost certainly has significant sleep apnea which is contributing to his fatigue. I've been in touch with his primary care physician to order a sleep study.   Current medicines are reviewed at length with the patient today.   The patient does not have concerns regarding his medicines.  The following changes were made today:  none  Labs/ tests ordered today include:    Orders Placed This Encounter  Procedures  . Implantable device check  . EKG 12-Lead  . 2D Echocardiogram without contrast     Disposition:   FU with me on an extremely 1 year(s)  Signed, Virl Axe, MD  01/16/2015 9:37 AM     [facility] [address] [place] [address] [phone] (office) [phone] (fax)

## 2015-01-20 ENCOUNTER — Other Ambulatory Visit (HOSPITAL_COMMUNITY): Payer: Medicare Other | Admitting: *Deleted

## 2015-01-20 ENCOUNTER — Ambulatory Visit (HOSPITAL_COMMUNITY): Payer: Medicare Other | Attending: Cardiology

## 2015-01-20 DIAGNOSIS — K219 Gastro-esophageal reflux disease without esophagitis: Secondary | ICD-10-CM | POA: Diagnosis not present

## 2015-01-20 DIAGNOSIS — E785 Hyperlipidemia, unspecified: Secondary | ICD-10-CM | POA: Diagnosis not present

## 2015-01-20 DIAGNOSIS — D329 Benign neoplasm of meninges, unspecified: Secondary | ICD-10-CM | POA: Insufficient documentation

## 2015-01-20 DIAGNOSIS — H409 Unspecified glaucoma: Secondary | ICD-10-CM | POA: Insufficient documentation

## 2015-01-20 DIAGNOSIS — R001 Bradycardia, unspecified: Secondary | ICD-10-CM | POA: Diagnosis not present

## 2015-01-20 DIAGNOSIS — M25552 Pain in left hip: Secondary | ICD-10-CM | POA: Insufficient documentation

## 2015-01-20 DIAGNOSIS — I2510 Atherosclerotic heart disease of native coronary artery without angina pectoris: Secondary | ICD-10-CM | POA: Insufficient documentation

## 2015-01-20 DIAGNOSIS — I442 Atrioventricular block, complete: Secondary | ICD-10-CM

## 2015-01-20 DIAGNOSIS — N289 Disorder of kidney and ureter, unspecified: Secondary | ICD-10-CM | POA: Insufficient documentation

## 2015-01-20 DIAGNOSIS — R0600 Dyspnea, unspecified: Secondary | ICD-10-CM

## 2015-01-20 DIAGNOSIS — K5900 Constipation, unspecified: Secondary | ICD-10-CM | POA: Diagnosis not present

## 2015-01-20 DIAGNOSIS — Z950 Presence of cardiac pacemaker: Secondary | ICD-10-CM | POA: Diagnosis not present

## 2015-01-20 DIAGNOSIS — I10 Essential (primary) hypertension: Secondary | ICD-10-CM | POA: Diagnosis not present

## 2015-01-20 MED ORDER — PERFLUTREN LIPID MICROSPHERE
1.0000 mL | Freq: Once | INTRAVENOUS | Status: AC
  Administered 2015-01-20: 1 mL via INTRAVENOUS

## 2015-01-20 NOTE — Progress Notes (Signed)
2D Echo with Definity completed. 01/20/2015

## 2015-02-06 ENCOUNTER — Telehealth: Payer: Self-pay | Admitting: Internal Medicine

## 2015-02-06 NOTE — Telephone Encounter (Signed)
Reviewed with Dr. [name] Comes and advised patient to follow up with PCP about shooting pains, informed that Dr. [name] Comes does not think these pains are related to his pacemaker. Did acknowledge that we understand he has complained about pocket pain since placing PPM back in 2012. Patient verbalized understanding and agreeable to call PCP in the morning.

## 2015-02-06 NOTE — Telephone Encounter (Signed)
New Message  Sharp Pains  Pt called states that within 10 minutes of walking today he has experienced sharp pains in his legs, his back and his arms. Pt says that the machine says everthing is normal per Dr. [name] Comes but he is not feeling well and the sharp pains are unbearable. Started experiencing the pains today.

## 2015-04-17 ENCOUNTER — Telehealth: Payer: Self-pay | Admitting: Cardiology

## 2015-04-17 ENCOUNTER — Ambulatory Visit (INDEPENDENT_AMBULATORY_CARE_PROVIDER_SITE_OTHER): Payer: Medicare Other | Admitting: *Deleted

## 2015-04-17 ENCOUNTER — Telehealth: Payer: Self-pay | Admitting: Internal Medicine

## 2015-04-17 DIAGNOSIS — I442 Atrioventricular block, complete: Secondary | ICD-10-CM

## 2015-04-17 NOTE — Telephone Encounter (Signed)
Spoke with pt and reminded pt of remote transmission that is due today. Pt verbalized understanding.   

## 2015-04-17 NOTE — Telephone Encounter (Signed)
Follow Up    Pt calling to verify transmission was received. Please call.

## 2015-04-17 NOTE — Telephone Encounter (Signed)
Spoke w/ pt and informed him that transmission was not received. Pt stated he will try again tomorrow but he will call before sending transmission so I can help him transmission.

## 2015-04-17 NOTE — Telephone Encounter (Signed)
error 

## 2015-04-18 DIAGNOSIS — I442 Atrioventricular block, complete: Secondary | ICD-10-CM

## 2015-04-18 NOTE — Progress Notes (Signed)
Remote pacemaker transmission.   

## 2015-04-22 LAB — CUP PACEART REMOTE DEVICE CHECK
Battery Voltage: 2.99 V
Brady Statistic AP VP Percent: 55.4 %
Brady Statistic AP VS Percent: 0 %
Brady Statistic AS VP Percent: 44.58 %
Brady Statistic AS VS Percent: 0.01 %
Brady Statistic RA Percent Paced: 55.41 %
Date Time Interrogation Session: 20160614135334
Lead Channel Impedance Value: 408 Ohm
Lead Channel Impedance Value: 640 Ohm
Lead Channel Sensing Intrinsic Amplitude: 2.262 mV
Lead Channel Setting Pacing Amplitude: 2 V
Lead Channel Setting Pacing Pulse Width: 0.4 ms
Lead Channel Setting Sensing Sensitivity: 2.1 mV
MDC IDC SET LEADCHNL RV PACING AMPLITUDE: 2.5 V
MDC IDC STAT BRADY RV PERCENT PACED: 99.99 %
Zone Setting Detection Interval: 350 ms
Zone Setting Detection Interval: 400 ms

## 2015-05-12 ENCOUNTER — Encounter: Payer: Self-pay | Admitting: Cardiology

## 2015-05-17 ENCOUNTER — Encounter (HOSPITAL_COMMUNITY): Payer: Self-pay

## 2015-05-17 ENCOUNTER — Emergency Department (HOSPITAL_COMMUNITY)
Admission: EM | Admit: 2015-05-17 | Discharge: 2015-05-17 | Disposition: A | Discharge status: Home / Self Care | Payer: Medicare Other | Attending: Emergency Medicine | Admitting: Emergency Medicine

## 2015-05-17 DIAGNOSIS — Z8619 Personal history of other infectious and parasitic diseases: Secondary | ICD-10-CM | POA: Insufficient documentation

## 2015-05-17 DIAGNOSIS — I10 Essential (primary) hypertension: Secondary | ICD-10-CM | POA: Insufficient documentation

## 2015-05-17 DIAGNOSIS — I2510 Atherosclerotic heart disease of native coronary artery without angina pectoris: Secondary | ICD-10-CM | POA: Insufficient documentation

## 2015-05-17 DIAGNOSIS — Z7982 Long term (current) use of aspirin: Secondary | ICD-10-CM | POA: Diagnosis not present

## 2015-05-17 DIAGNOSIS — Z8669 Personal history of other diseases of the nervous system and sense organs: Secondary | ICD-10-CM | POA: Diagnosis not present

## 2015-05-17 DIAGNOSIS — Z8739 Personal history of other diseases of the musculoskeletal system and connective tissue: Secondary | ICD-10-CM | POA: Diagnosis not present

## 2015-05-17 DIAGNOSIS — Z8711 Personal history of peptic ulcer disease: Secondary | ICD-10-CM | POA: Diagnosis not present

## 2015-05-17 DIAGNOSIS — E785 Hyperlipidemia, unspecified: Secondary | ICD-10-CM | POA: Insufficient documentation

## 2015-05-17 DIAGNOSIS — Z87448 Personal history of other diseases of urinary system: Secondary | ICD-10-CM | POA: Diagnosis not present

## 2015-05-17 DIAGNOSIS — Z79899 Other long term (current) drug therapy: Secondary | ICD-10-CM | POA: Diagnosis not present

## 2015-05-17 DIAGNOSIS — K219 Gastro-esophageal reflux disease without esophagitis: Secondary | ICD-10-CM | POA: Diagnosis not present

## 2015-05-17 DIAGNOSIS — Z86018 Personal history of other benign neoplasm: Secondary | ICD-10-CM | POA: Diagnosis not present

## 2015-05-17 DIAGNOSIS — L290 Pruritus ani: Secondary | ICD-10-CM

## 2015-05-17 DIAGNOSIS — K6289 Other specified diseases of anus and rectum: Secondary | ICD-10-CM | POA: Diagnosis not present

## 2015-05-17 DIAGNOSIS — K649 Unspecified hemorrhoids: Secondary | ICD-10-CM | POA: Diagnosis present

## 2015-05-17 MED ORDER — HYDROCORTISONE 1 % EX CREA: TOPICAL_CREAM | CUTANEOUS | Status: DC

## 2015-05-17 NOTE — ED Provider Notes (Addendum)
CSN: [id number]     Arrival date & time 05/17/15  2105 History   First MD Initiated Contact with Antonio Le 05/17/15 2300     Chief Complaint  Antonio Le presents with  . Hemorrhoids     (Consider location/radiation/quality/duration/timing/severity/associated sxs/prior Treatment) HPI  Antonio Le is a 80 y.o. male with past medical history of hemorrhoids presenting today with anal pain and itching. Antonio Le states is going on for the past 2 weeks. He has tried preparation H without any relief. He also admits to constipation over the past several months and having to strain on the toilet.  He has had no fevers abdominal pain nausea or vomiting. He has had no bright red blood per rectum. Antonio Le states this feels like his hemorrhoids he had over 20 years ago. He has no further complaints.  10 Systems reviewed and are negative for acute change except as noted in the HPI.     Past Medical History  Diagnosis Date  . CAD (coronary artery disease)     cath 01/12 no CAD, no records of reported silent MI in [facility], in [place], normal [place] 2007, EF 30%, mild diastolic dysfunction  . 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 [name] Comes  . Hyperlipidemia   . Hypertension   . GERD (gastroesophageal reflux disease)   . Meningioma     involves right optic nerve  . Chronic constipation     GI referral to Dr. Oletta Lamas in the past, pt did not go  . Idiopathic acute pancreatitis   . Syncopal episodes   . Renal insufficiency     creatinine ~1.2 in the past  . H. pylori infection     PUD association  . History of alcohol abuse   . Glaucoma, open angle   . Cervical spondylarthritis   . Tinea cruris    Past Surgical History  Procedure Laterality Date  . Left hip replacement    . Cataract extraction     Family History  Problem Relation Age of Onset  . Diabetes Mother    History  Substance Use Topics  . Smoking  status: Never Smoker   . Smokeless tobacco: Not on file  . Alcohol Use: No     Comment: stopped 1974    Review of Systems    Allergies  Review of Antonio Le's allergies indicates no known allergies.  Home Medications   Prior to Admission medications   Medication Sig Start Date End Date Taking? Authorizing Provider  aspirin 81 MG tablet Take 81 mg by mouth daily.    Yes Historical Provider, MD  darifenacin (ENABLEX) 15 MG 24 hr tablet Take 15 mg by mouth daily.   Yes Historical Provider, MD  metFORMIN (GLUCOPHAGE-XR) 500 MG 24 hr tablet Take 500 mg by mouth at bedtime.    Yes Historical Provider, MD  omeprazole (PRILOSEC) 40 MG capsule Take 40 mg by mouth daily.    Yes Historical Provider, MD  oxyCODONE (ROXICODONE) 15 MG immediate release tablet Take 15 mg by mouth 4 (four) times daily.  05/04/15  Yes Historical Provider, MD  rosuvastatin (CRESTOR) 20 MG tablet Take 10 mg by mouth daily.    Yes Historical Provider, MD  tamsulosin (FLOMAX) 0.4 MG CAPS capsule Take 0.4 mg by mouth daily after supper.  12/19/14  Yes Historical Provider, MD  vitamin C (ASCORBIC ACID) 500 MG tablet Take 500 mg by mouth daily.   Yes  Historical Provider, MD  clonazePAM (KLONOPIN) 0.5 MG tablet Take 1 mg by mouth 2 (two) times daily as needed for anxiety. For anxiety    Historical Provider, MD   BP 137/66 mmHg  Pulse 59  Temp(Src) 98.2 F (36.8 C) (Oral)  Resp 18  SpO2 98% Physical Exam  Constitutional: He is oriented to person, place, and time. Vital signs are normal. He appears well-developed and well-nourished.  Non-toxic appearance. He does not appear ill. No distress.  HENT:  Head: Normocephalic and atraumatic.  Nose: Nose normal.  Mouth/Throat: Oropharynx is clear and moist. No oropharyngeal exudate.  Eyes: Conjunctivae and EOM are normal. Pupils are equal, round, and reactive to light. No scleral icterus.  Neck: Normal range of motion. Neck supple. No tracheal deviation, no edema, no erythema and  normal range of motion present. No thyroid mass and no thyromegaly present.  Cardiovascular: Normal rate, regular rhythm, S1 normal, S2 normal, normal heart sounds, intact distal pulses and normal pulses.  Exam reveals no gallop and no friction rub.   No murmur heard. Pulses:      Radial pulses are 2+ on the right side, and 2+ on the left side.       Dorsalis pedis pulses are 2+ on the right side, and 2+ on the left side.  Pulmonary/Chest: Effort normal and breath sounds normal. No respiratory distress. He has no wheezes. He has no rhonchi. He has no rales.  Abdominal: Soft. Normal appearance and bowel sounds are normal. He exhibits no distension, no ascites and no mass. There is no hepatosplenomegaly. There is no tenderness. There is no rebound, no guarding and no CVA tenderness.  Genitourinary: Rectum normal.  No hemorrhoids seen or felt, no gross blood.  Small amounts of stool seen at rectal entrance  Musculoskeletal: Normal range of motion. He exhibits no edema or tenderness.  Lymphadenopathy:    He has no cervical adenopathy.  Neurological: He is alert and oriented to person, place, and time. He has normal strength. No cranial nerve deficit or sensory deficit.  Skin: Skin is warm, dry and intact. No petechiae and no rash noted. He is not diaphoretic. No erythema. No pallor.  Psychiatric: He has a normal mood and affect. His behavior is normal. Judgment normal.  Nursing note and vitals reviewed.   ED Course  Procedures (including critical care time) Labs Review Labs Reviewed - No data to display  Imaging Review No results found.   EKG Interpretation None      MDM   Final diagnoses:  None  Antonio Le since emergency department for anal pruritus. There are no signs of external or internal hemorrhoids. There are no lesions seen. He has a normal rectal exam. I do not know the cause of Antonio Le's symptoms however we'll treat symptomatically at this point. I recommended sitz baths for  him. As well as 1% hydrocortisone cream that can be used twice per day as a barrier. Primary care follow-up was advised within 3 days. He overall appears well and in no acute distress. His vital signs remain within his normal limits and he is safe for discharge.   Everlene Balls, MD 05/17/15 2326

## 2015-05-17 NOTE — Discharge Instructions (Signed)
Anal Pruritus Antonio Le, there were no hemorrhoids seen on your exam.  Be sure to wipe completely after using the bathroom. You can also use sitz baths every day at home.  Use hydrocortisone cream twice per day in the area as well as a barrier.  See your primary care physician within 3 days for close follow up.  If symptoms worsen, come back to the ED immediately.  Thank you. Anal pruritus is when the area around the opening of the butt (anus) itches. This itching often happens when the area becomes moist. Moisture may be caused by sweating or a small amount of poop (stool) left on the area. Scratching can cause more skin damage. HOME CARE 1. Keep clean by washing your body well. 2. Clean the affected area with wet toilet paper, baby wipes, or a wet washcloth. Do this after you poop and at bedtime. Avoid using soaps on this area. Dry the area well. Pat the area dry. 3. Do not scrub the area with anything, even toilet paper. 4. Do not scratch the itchy area. Scratching makes the itching worse. 5. Take a warm water bath (sitz bath). Do this for 15 to 20 minutes, 2 to 3 times a day. Pat the area dry. 6. Use zinc oxide cream or a cream that keeps moisture away on the area. 7. Only take medicines as told by your doctor. 8. Talk to your doctor about fiber pills (supplements). 9. Wear cotton underwear and loose clothing. 10. Do not use bubble bath, scented toilet paper, or genital deodorants. GET HELP RIGHT AWAY IF:  Your itching does not get better or it gets worse.  You have a fever.  You have more pain, puffiness (swelling), or redness. MAKE SURE YOU:   Understand these instructions.  Will watch your condition.  Will get help right away if you are not doing well or get worse. Document Released: 07/03/2011 Document Revised: 01/13/2012 Document Reviewed: 07/03/2011 [facility] Patient Information 2015 [place], [place]. This information is not intended to replace advice given to you by your health  care provider. Make sure you discuss any questions you have with your health care provider. Sitz Bath A sitz bath is a warm water bath taken in the sitting position. The water covers only the hips and butt (buttocks). It may be used for either healing or cleaning purposes. Sitz baths are also used to relieve pain, itching, or muscle tightening (spasms). The water may contain medicine. Moist heat will help you heal and relax.  HOME CARE  Take 3 to 4 sitz baths a day. 11. Fill the bathtub half-full with warm water. 12. Sit in the water and open the drain a little. 13. Turn on the warm water to keep the tub half-full. Keep the water running constantly. 14. Soak in the water for 15 to 20 minutes. 15. After the sitz bath, pat the affected area dry. GET HELP RIGHT AWAY IF: You get worse instead of better. Stop the sitz baths if you get worse. MAKE SURE YOU:  Understand these instructions.  Will watch your condition.  Will get help right away if you are not doing well or get worse. Document Released: 11/28/2004 Document Revised: 07/15/2012 Document Reviewed: 02/18/2011 [facility] Patient Information 2015 [place], [place]. This information is not intended to replace advice given to you by your health care provider. Make sure you discuss any questions you have with your health care provider.

## 2015-05-17 NOTE — ED Notes (Signed)
Pt complains of burning and itching around his rectum for one week

## 2015-05-18 ENCOUNTER — Encounter: Payer: Self-pay | Admitting: Internal Medicine

## 2015-06-08 DIAGNOSIS — E785 Hyperlipidemia, unspecified: Secondary | ICD-10-CM | POA: Diagnosis not present

## 2015-06-08 DIAGNOSIS — I10 Essential (primary) hypertension: Secondary | ICD-10-CM | POA: Diagnosis not present

## 2015-06-08 DIAGNOSIS — E1165 Type 2 diabetes mellitus with hyperglycemia: Secondary | ICD-10-CM | POA: Diagnosis not present

## 2015-06-16 DIAGNOSIS — R350 Frequency of micturition: Secondary | ICD-10-CM | POA: Diagnosis not present

## 2015-06-21 ENCOUNTER — Telehealth: Payer: Self-pay | Admitting: Internal Medicine

## 2015-06-21 NOTE — Telephone Encounter (Signed)
New problem    Pt has questions concerning the hook up of his device. Please call pt.

## 2015-06-21 NOTE — Telephone Encounter (Signed)
Pt stated that he had his land line phone disconnected. Informed pt that this is ok b/c he has the wirex adapter. Pt verbalized understating.

## 2015-07-03 DIAGNOSIS — R350 Frequency of micturition: Secondary | ICD-10-CM | POA: Diagnosis not present

## 2015-07-05 DIAGNOSIS — E785 Hyperlipidemia, unspecified: Secondary | ICD-10-CM | POA: Diagnosis not present

## 2015-07-05 DIAGNOSIS — E119 Type 2 diabetes mellitus without complications: Secondary | ICD-10-CM | POA: Diagnosis not present

## 2015-07-05 DIAGNOSIS — R32 Unspecified urinary incontinence: Secondary | ICD-10-CM | POA: Diagnosis not present

## 2015-07-05 DIAGNOSIS — I10 Essential (primary) hypertension: Secondary | ICD-10-CM | POA: Diagnosis not present

## 2015-07-12 DIAGNOSIS — Z23 Encounter for immunization: Secondary | ICD-10-CM | POA: Diagnosis not present

## 2015-07-18 ENCOUNTER — Ambulatory Visit (INDEPENDENT_AMBULATORY_CARE_PROVIDER_SITE_OTHER): Payer: BLUE CROSS/BLUE SHIELD | Admitting: *Deleted

## 2015-07-18 ENCOUNTER — Telehealth: Payer: Self-pay | Admitting: Cardiology

## 2015-07-18 DIAGNOSIS — I442 Atrioventricular block, complete: Secondary | ICD-10-CM | POA: Diagnosis not present

## 2015-07-18 NOTE — Progress Notes (Signed)
Remote pacemaker transmission.   

## 2015-07-18 NOTE — Telephone Encounter (Signed)
Spoke with pt and reminded pt of remote transmission that is due today. Pt verbalized understanding.   

## 2015-07-27 LAB — CUP PACEART REMOTE DEVICE CHECK
Brady Statistic AP VP Percent: 62.08 %
Brady Statistic AP VS Percent: 0 %
Brady Statistic AS VP Percent: 37.91 %
Brady Statistic AS VS Percent: 0.01 %
Date Time Interrogation Session: 20160913174716
Lead Channel Impedance Value: 680 Ohm
Lead Channel Setting Pacing Amplitude: 2.5 V
Lead Channel Setting Pacing Pulse Width: 0.4 ms
MDC IDC MSMT BATTERY VOLTAGE: 2.97 V
MDC IDC MSMT LEADCHNL RA SENSING INTR AMPL: 2.617 mV
MDC IDC MSMT LEADCHNL RV IMPEDANCE VALUE: 416 Ohm
MDC IDC SET LEADCHNL RA PACING AMPLITUDE: 2 V
MDC IDC SET LEADCHNL RV SENSING SENSITIVITY: 2.1 mV
MDC IDC STAT BRADY RA PERCENT PACED: 62.08 %
MDC IDC STAT BRADY RV PERCENT PACED: 99.99 %
Zone Setting Detection Interval: 350 ms
Zone Setting Detection Interval: 400 ms

## 2015-08-11 DIAGNOSIS — Z79891 Long term (current) use of opiate analgesic: Secondary | ICD-10-CM | POA: Diagnosis not present

## 2015-08-11 DIAGNOSIS — G894 Chronic pain syndrome: Secondary | ICD-10-CM | POA: Diagnosis not present

## 2015-08-11 DIAGNOSIS — M47816 Spondylosis without myelopathy or radiculopathy, lumbar region: Secondary | ICD-10-CM | POA: Diagnosis not present

## 2015-08-11 DIAGNOSIS — Z79899 Other long term (current) drug therapy: Secondary | ICD-10-CM | POA: Diagnosis not present

## 2015-08-11 DIAGNOSIS — M47812 Spondylosis without myelopathy or radiculopathy, cervical region: Secondary | ICD-10-CM | POA: Diagnosis not present

## 2015-08-17 ENCOUNTER — Encounter: Payer: Self-pay | Admitting: Cardiology

## 2015-08-24 ENCOUNTER — Encounter: Payer: Self-pay | Admitting: Internal Medicine

## 2015-09-18 ENCOUNTER — Emergency Department (HOSPITAL_COMMUNITY): Payer: Medicare Other

## 2015-09-18 ENCOUNTER — Encounter (HOSPITAL_COMMUNITY): Payer: Self-pay | Admitting: Family Medicine

## 2015-09-18 ENCOUNTER — Emergency Department (HOSPITAL_COMMUNITY)
Admission: EM | Admit: 2015-09-18 | Discharge: 2015-09-18 | Disposition: A | Discharge status: Home / Self Care | Payer: Medicare Other | Attending: Emergency Medicine | Admitting: Emergency Medicine

## 2015-09-18 DIAGNOSIS — I2510 Atherosclerotic heart disease of native coronary artery without angina pectoris: Secondary | ICD-10-CM | POA: Insufficient documentation

## 2015-09-18 DIAGNOSIS — Z7982 Long term (current) use of aspirin: Secondary | ICD-10-CM | POA: Diagnosis not present

## 2015-09-18 DIAGNOSIS — L089 Local infection of the skin and subcutaneous tissue, unspecified: Secondary | ICD-10-CM | POA: Insufficient documentation

## 2015-09-18 DIAGNOSIS — Z7952 Long term (current) use of systemic steroids: Secondary | ICD-10-CM | POA: Diagnosis not present

## 2015-09-18 DIAGNOSIS — R21 Rash and other nonspecific skin eruption: Secondary | ICD-10-CM | POA: Diagnosis present

## 2015-09-18 DIAGNOSIS — Z87448 Personal history of other diseases of urinary system: Secondary | ICD-10-CM | POA: Insufficient documentation

## 2015-09-18 DIAGNOSIS — R0789 Other chest pain: Secondary | ICD-10-CM | POA: Diagnosis not present

## 2015-09-18 DIAGNOSIS — Z8669 Personal history of other diseases of the nervous system and sense organs: Secondary | ICD-10-CM | POA: Insufficient documentation

## 2015-09-18 DIAGNOSIS — Z87438 Personal history of other diseases of male genital organs: Secondary | ICD-10-CM | POA: Diagnosis not present

## 2015-09-18 DIAGNOSIS — Z79899 Other long term (current) drug therapy: Secondary | ICD-10-CM | POA: Insufficient documentation

## 2015-09-18 DIAGNOSIS — R350 Frequency of micturition: Secondary | ICD-10-CM | POA: Insufficient documentation

## 2015-09-18 DIAGNOSIS — K219 Gastro-esophageal reflux disease without esophagitis: Secondary | ICD-10-CM | POA: Insufficient documentation

## 2015-09-18 DIAGNOSIS — Z8739 Personal history of other diseases of the musculoskeletal system and connective tissue: Secondary | ICD-10-CM | POA: Diagnosis not present

## 2015-09-18 DIAGNOSIS — R300 Dysuria: Secondary | ICD-10-CM | POA: Insufficient documentation

## 2015-09-18 DIAGNOSIS — Z86011 Personal history of benign neoplasm of the brain: Secondary | ICD-10-CM | POA: Diagnosis not present

## 2015-09-18 DIAGNOSIS — I10 Essential (primary) hypertension: Secondary | ICD-10-CM | POA: Insufficient documentation

## 2015-09-18 DIAGNOSIS — Z79891 Long term (current) use of opiate analgesic: Secondary | ICD-10-CM | POA: Insufficient documentation

## 2015-09-18 DIAGNOSIS — E669 Obesity, unspecified: Secondary | ICD-10-CM | POA: Diagnosis not present

## 2015-09-18 DIAGNOSIS — K5900 Constipation, unspecified: Secondary | ICD-10-CM | POA: Diagnosis not present

## 2015-09-18 DIAGNOSIS — L989 Disorder of the skin and subcutaneous tissue, unspecified: Secondary | ICD-10-CM

## 2015-09-18 DIAGNOSIS — E785 Hyperlipidemia, unspecified: Secondary | ICD-10-CM | POA: Insufficient documentation

## 2015-09-18 DIAGNOSIS — R222 Localized swelling, mass and lump, trunk: Secondary | ICD-10-CM | POA: Diagnosis not present

## 2015-09-18 DIAGNOSIS — Z8619 Personal history of other infectious and parasitic diseases: Secondary | ICD-10-CM | POA: Diagnosis not present

## 2015-09-18 LAB — COMPREHENSIVE METABOLIC PANEL
ALBUMIN: 3.7 g/dL (ref 3.5–5.0)
ALT: 21 U/L (ref 17–63)
AST: 32 U/L (ref 15–41)
Alkaline Phosphatase: 76 U/L (ref 38–126)
Anion gap: 9 (ref 5–15)
BUN: 6 mg/dL (ref 6–20)
CO2: 26 mmol/L (ref 22–32)
CREATININE: 1.09 mg/dL (ref 0.61–1.24)
Calcium: 9 mg/dL (ref 8.9–10.3)
Chloride: 105 mmol/L (ref 101–111)
Glucose, Bld: 97 mg/dL (ref 65–99)
POTASSIUM: 4.3 mmol/L (ref 3.5–5.1)
SODIUM: 140 mmol/L (ref 135–145)
Total Bilirubin: 0.5 mg/dL (ref 0.3–1.2)
Total Protein: 7.1 g/dL (ref 6.5–8.1)

## 2015-09-18 LAB — URINALYSIS, ROUTINE W REFLEX MICROSCOPIC
BILIRUBIN URINE: NEGATIVE
Glucose, UA: NEGATIVE mg/dL
Hgb urine dipstick: NEGATIVE
Ketones, ur: NEGATIVE mg/dL
Leukocytes, UA: NEGATIVE
NITRITE: NEGATIVE
PH: 6.5 (ref 5.0–8.0)
Protein, ur: NEGATIVE mg/dL
SPECIFIC GRAVITY, URINE: 1.009 (ref 1.005–1.030)
Urobilinogen, UA: 0.2 mg/dL (ref 0.0–1.0)

## 2015-09-18 LAB — CBC WITH DIFFERENTIAL/PLATELET
BASOS PCT: 1 %
Basophils Absolute: 0 10*3/uL (ref 0.0–0.1)
EOS ABS: 0.1 10*3/uL (ref 0.0–0.7)
EOS PCT: 2 %
HCT: 41.3 % (ref 39.0–52.0)
Hemoglobin: 13.9 g/dL (ref 13.0–17.0)
Lymphocytes Relative: 50 %
Lymphs Abs: 2.8 10*3/uL (ref 0.7–4.0)
MCH: 31.4 pg (ref 26.0–34.0)
MCHC: 33.7 g/dL (ref 30.0–36.0)
MCV: 93.4 fL (ref 78.0–100.0)
MONO ABS: 0.4 10*3/uL (ref 0.1–1.0)
Monocytes Relative: 7 %
Neutro Abs: 2.3 10*3/uL (ref 1.7–7.7)
Neutrophils Relative %: 40 %
PLATELETS: 131 10*3/uL — AB (ref 150–400)
RBC: 4.42 MIL/uL (ref 4.22–5.81)
RDW: 13.5 % (ref 11.5–15.5)
WBC: 5.7 10*3/uL (ref 4.0–10.5)

## 2015-09-18 LAB — LIPASE, BLOOD: LIPASE: 23 U/L (ref 11–51)

## 2015-09-18 NOTE — ED Notes (Signed)
Pt stable, ambulatory, states understanding of discharge instructions 

## 2015-09-18 NOTE — ED Notes (Signed)
Pt here for bumps to chest. sts stinging and into stomach. sts he wants to make sure its not cancer.

## 2015-09-18 NOTE — Discharge Instructions (Signed)
1. Medications: usual home medications 2. Treatment: rest, drink plenty of fluids 3. Follow Up: please followup with your primary doctor for discussion of your diagnoses and further evaluation after today's visit; please return to the ER for severe chest pain, shortness of breath, new or worsening symptoms

## 2015-09-18 NOTE — ED Provider Notes (Signed)
CSN: [id number]     Arrival date & time 09/18/15  1339 History   First MD Initiated Contact with Patient 09/18/15 1711     Chief Complaint  Patient presents with  . Rash    HPI   Antonio Le is a 80 y.o. male with a PMH of HTN, HLD, CAD who presents to the ED with bumps to his chest with associated chest and abdominal "stinging." He reports the bumps on his chest have been present since childhood, but his "stinging" has progressively worsened over the past several weeks. He denies exacerbating or alleviating factors. He denies fever, chills, cough, congestion, nausea, vomiting, diarrhea. He reports constipation, which he states is unchanged from baseline. He reports his last bowel movement was this morning. He reports dysuria and urinary frequency, which he states is chronic.   Past Medical History  Diagnosis Date  . CAD (coronary artery disease)     cath 01/12 no CAD, no records of reported silent MI in [facility], in [place], normal [place] 2007, EF 123456, mild diastolic dysfunction  . 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 [name] Comes  . Hyperlipidemia   . Hypertension   . GERD (gastroesophageal reflux disease)   . 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 Chronic constipation     GI referral to Dr. Oletta Lamas in the past, pt did not go  . Idiopathic acute pancreatitis   . Syncopal episodes   . Renal insufficiency     creatinine ~1.2 in the past  . H. pylori infection     PUD association  . History of alcohol abuse   . Glaucoma, open angle   . Cervical spondylarthritis   . Tinea cruris    Past Surgical History  Procedure Laterality Date  . Left hip replacement    . Cataract extraction     Family History  Problem Relation Age of Onset  . Diabetes Mother    Social History  Substance Use Topics  . Smoking status: Never Smoker   . Smokeless tobacco: None  . Alcohol Use: No   Comment: stopped 1974     Review of Systems  Constitutional: Negative for fever and chills.  HENT: Negative for congestion.   Respiratory: Negative for cough and shortness of breath.   Cardiovascular:       Reports chest "stinging."  Gastrointestinal: Positive for constipation. Negative for nausea, vomiting and diarrhea.       Reports abdominal "stinging."  Genitourinary: Positive for dysuria and frequency. Negative for urgency.  Neurological: Negative for dizziness, weakness, light-headedness, numbness and headaches.  All other systems reviewed and are negative.     Allergies  Review of patient's allergies indicates no known allergies.  Home Medications   Prior to Admission medications   Medication Sig Start Date End Date Taking? Authorizing Provider  aspirin 81 MG tablet Take 81 mg by mouth daily.    Yes Historical Provider, MD  clonazePAM (KLONOPIN) 0.5 MG tablet Take 1 mg by mouth 2 (two) times daily as needed for anxiety. For anxiety   Yes Historical Provider, MD  darifenacin (ENABLEX) 15 MG 24 hr tablet Take 15 mg by mouth daily.   Yes Historical Provider, MD  hydrocortisone cream 1 % Apply to affected area 2 times daily Patient taking differently: Apply 1 application topically. Apply to affected area 2 times daily 05/17/15  Yes Everlene Balls, MD  metFORMIN (GLUCOPHAGE-XR) 500 MG 24 hr tablet Take 500 mg by mouth at bedtime.    Yes Historical Provider, MD  omeprazole (PRILOSEC) 40 MG capsule Take 40 mg by mouth daily.    Yes Historical Provider, MD  oxyCODONE (ROXICODONE) 15 MG immediate release tablet Take 15 mg by mouth 4 (four) times daily. Take every day per patient 05/04/15  Yes Historical Provider, MD  rosuvastatin (CRESTOR) 20 MG tablet Take 10 mg by mouth daily.    Yes Historical Provider, MD  Specialty Vitamins Products (PROSTATE PO) Take 1 tablet by mouth 2 (two) times daily.   Yes Historical Provider, MD  tamsulosin (FLOMAX) 0.4 MG CAPS capsule Take 0.4 mg by mouth  daily after supper.  12/19/14  Yes Historical Provider, MD  vitamin C (ASCORBIC ACID) 500 MG tablet Take 500 mg by mouth daily.   Yes Historical Provider, MD    BP 170/93 mmHg  Pulse 55  Temp(Src) 97.6 F (36.4 C) (Oral)  Resp 20  Ht 5' 4.5" (1.638 m)  Wt 184 lb 6 oz (83.632 kg)  BMI 31.17 kg/m2  SpO2 98% Physical Exam  Constitutional: He is oriented to person, place, and time. No distress.  Obese male in no acute distress.  HENT:  Head: Normocephalic and atraumatic.  Right Ear: External ear normal.  Left Ear: External ear normal.  Nose: Nose normal.  Mouth/Throat: Uvula is midline, oropharynx is clear and moist and mucous membranes are normal.  Eyes: Conjunctivae, EOM and lids are normal. Pupils are equal, round, and reactive to light. Right eye exhibits no discharge. Left eye exhibits no discharge. No scleral icterus.  Neck: Normal range of motion. Neck supple.  Cardiovascular: Normal rate, regular rhythm, normal heart sounds, intact distal pulses and normal pulses.   Pulmonary/Chest: Effort normal and breath sounds normal. No respiratory distress. He has no wheezes. He has no rales. He exhibits no tenderness.  Abdominal: Soft. Normal appearance and bowel sounds are normal. He exhibits no distension and no mass. There is no tenderness. There is no rigidity, no rebound and no guarding.  Musculoskeletal: Normal range of motion. He exhibits no edema or tenderness.  Neurological: He is alert and oriented to person, place, and time. He has normal strength. No sensory deficit.  Skin: Skin is warm, dry and intact. No rash noted. He is not diaphoretic. No erythema. No pallor.  1 cm raised flesh-colored lesion to anterior chest wall. No surrounding erythema.  Psychiatric: He has a normal mood and affect. His speech is normal and behavior is normal.  Nursing note and vitals reviewed.   ED Course  Procedures (including critical care time)  Labs Review Labs Reviewed  CBC WITH  DIFFERENTIAL/PLATELET - Abnormal; Notable for the following:    Platelets 131 (*)    All other components within normal limits  COMPREHENSIVE METABOLIC PANEL  LIPASE, BLOOD  URINALYSIS, ROUTINE W REFLEX MICROSCOPIC (NOT AT [facility])    Imaging Review Dg Chest 2 View  09/18/2015  CLINICAL DATA:  Stinging in the chest. EXAM: CHEST  2 VIEW COMPARISON:  12/04/2010 FINDINGS: Heart size is at the upper limits of normal. The aorta is unfolded. Dual lead pacemaker is in place, unchanged. The lungs are clear. The vascularity is normal. No effusions. No bone abnormality. IMPRESSION: No active disease.  Pacemaker.  Unfolded aorta. Electronically Signed   By: Nelson Chimes M.D.   On: 09/18/2015 18:39     I have personally reviewed and evaluated these images and lab results as part  of my medical decision-making.   EKG Interpretation None      MDM   Final diagnoses:  Skin lesion of chest wall    80 year old male presents with "bumps" to his chest with associated "stinging" in his chest and abdomen. Denies fever, chills, cough, congestion, nausea, vomiting, diarrhea. Reports constipation, which he states is unchanged from baseline. Reports his last bowel movement was this morning. Reports dysuria and urinary frequency, which he states is chronic.  Patient is afebrile. Vital signs stable. Heart regular rate and rhythm. Lungs clear to auscultation bilaterally. Abdomen soft, nontender, nondistended. No lower extremity edema. 1 cm raised flesh-colored lesion to anterior chest wall. No surrounding erythema or signs of infection. No tenderness to palpation of chest wall.  CBC negative for leukocytosis or anemia. CMP and lipase within normal limits. UA negative for infection. Chest x-ray negative for active disease.  Patient discussed with and seen by Dr. Vanita Panda. Lesion on chest appears consistent with scar. Patient is well-appearing, feel he is stable for discharge at this time. Return precautions  discussed. Patient to follow-up with PCP.   BP 170/93 mmHg  Pulse 55  Temp(Src) 97.6 F (36.4 C) (Oral)  Resp 20  Ht 5' 4.5" (1.638 m)  Wt 184 lb 6 oz (83.632 kg)  BMI 31.17 kg/m2  SpO2 98%     Marella Chimes, PA-C 09/18/15 1949  Carmin Muskrat, MD 09/19/15 [phone]

## 2015-10-10 DIAGNOSIS — K5900 Constipation, unspecified: Secondary | ICD-10-CM | POA: Diagnosis not present

## 2015-10-10 DIAGNOSIS — R300 Dysuria: Secondary | ICD-10-CM | POA: Diagnosis not present

## 2015-10-17 ENCOUNTER — Ambulatory Visit (INDEPENDENT_AMBULATORY_CARE_PROVIDER_SITE_OTHER): Payer: Medicare Other | Admitting: *Deleted

## 2015-10-17 DIAGNOSIS — I442 Atrioventricular block, complete: Secondary | ICD-10-CM | POA: Diagnosis not present

## 2015-10-17 NOTE — Progress Notes (Signed)
Remote pacemaker transmission.   

## 2015-10-21 LAB — CUP PACEART REMOTE DEVICE CHECK
Brady Statistic AS VP Percent: 42.09 %
Brady Statistic RA Percent Paced: 57.89 %
Date Time Interrogation Session: 20161213123802
Implantable Lead Implant Date: 20120130
Implantable Lead Location: 753859
Implantable Lead Location: 753860
Implantable Lead Model: 5076
Lead Channel Sensing Intrinsic Amplitude: 1.996 mV
Lead Channel Setting Pacing Amplitude: 2.5 V
Lead Channel Setting Pacing Pulse Width: 0.4 ms
MDC IDC LEAD IMPLANT DT: 20120130
MDC IDC LEAD MODEL: 5086
MDC IDC MSMT BATTERY VOLTAGE: 2.97 V
MDC IDC MSMT LEADCHNL RA IMPEDANCE VALUE: 696 Ohm
MDC IDC MSMT LEADCHNL RV IMPEDANCE VALUE: 408 Ohm
MDC IDC SET LEADCHNL RA PACING AMPLITUDE: 2 V
MDC IDC SET LEADCHNL RV SENSING SENSITIVITY: 2.1 mV
MDC IDC STAT BRADY AP VP PERCENT: 57.89 %
MDC IDC STAT BRADY AP VS PERCENT: 0 %
MDC IDC STAT BRADY AS VS PERCENT: 0.01 %
MDC IDC STAT BRADY RV PERCENT PACED: 99.98 %

## 2015-10-25 DIAGNOSIS — I10 Essential (primary) hypertension: Secondary | ICD-10-CM | POA: Diagnosis not present

## 2015-10-25 DIAGNOSIS — E119 Type 2 diabetes mellitus without complications: Secondary | ICD-10-CM | POA: Diagnosis not present

## 2015-10-26 ENCOUNTER — Encounter: Payer: Self-pay | Admitting: *Deleted

## 2015-10-30 ENCOUNTER — Emergency Department (HOSPITAL_COMMUNITY): Payer: Medicare Other

## 2015-10-30 ENCOUNTER — Observation Stay (HOSPITAL_COMMUNITY)
Admission: EM | Admit: 2015-10-30 | Discharge: 2015-11-01 | Disposition: A | Discharge status: Home / Self Care | Payer: Medicare Other | Attending: Internal Medicine | Admitting: Internal Medicine

## 2015-10-30 ENCOUNTER — Encounter (HOSPITAL_COMMUNITY): Payer: Self-pay | Admitting: *Deleted

## 2015-10-30 DIAGNOSIS — Z7984 Long term (current) use of oral hypoglycemic drugs: Secondary | ICD-10-CM | POA: Insufficient documentation

## 2015-10-30 DIAGNOSIS — N289 Disorder of kidney and ureter, unspecified: Secondary | ICD-10-CM | POA: Insufficient documentation

## 2015-10-30 DIAGNOSIS — E785 Hyperlipidemia, unspecified: Secondary | ICD-10-CM | POA: Insufficient documentation

## 2015-10-30 DIAGNOSIS — Z7982 Long term (current) use of aspirin: Secondary | ICD-10-CM | POA: Insufficient documentation

## 2015-10-30 DIAGNOSIS — H4010X0 Unspecified open-angle glaucoma, stage unspecified: Secondary | ICD-10-CM | POA: Insufficient documentation

## 2015-10-30 DIAGNOSIS — Z79899 Other long term (current) drug therapy: Secondary | ICD-10-CM | POA: Insufficient documentation

## 2015-10-30 DIAGNOSIS — K8500 Idiopathic acute pancreatitis without necrosis or infection: Secondary | ICD-10-CM | POA: Diagnosis not present

## 2015-10-30 DIAGNOSIS — M47812 Spondylosis without myelopathy or radiculopathy, cervical region: Secondary | ICD-10-CM | POA: Insufficient documentation

## 2015-10-30 DIAGNOSIS — I2510 Atherosclerotic heart disease of native coronary artery without angina pectoris: Secondary | ICD-10-CM | POA: Insufficient documentation

## 2015-10-30 DIAGNOSIS — I10 Essential (primary) hypertension: Secondary | ICD-10-CM | POA: Diagnosis not present

## 2015-10-30 DIAGNOSIS — D329 Benign neoplasm of meninges, unspecified: Secondary | ICD-10-CM | POA: Diagnosis not present

## 2015-10-30 DIAGNOSIS — Z8619 Personal history of other infectious and parasitic diseases: Secondary | ICD-10-CM | POA: Diagnosis not present

## 2015-10-30 DIAGNOSIS — R55 Syncope and collapse: Principal | ICD-10-CM | POA: Insufficient documentation

## 2015-10-30 DIAGNOSIS — E119 Type 2 diabetes mellitus without complications: Secondary | ICD-10-CM

## 2015-10-30 DIAGNOSIS — G459 Transient cerebral ischemic attack, unspecified: Secondary | ICD-10-CM

## 2015-10-30 DIAGNOSIS — R200 Anesthesia of skin: Secondary | ICD-10-CM

## 2015-10-30 LAB — CBC
HEMATOCRIT: 41.8 % (ref 39.0–52.0)
HEMOGLOBIN: 13.8 g/dL (ref 13.0–17.0)
MCH: 31.2 pg (ref 26.0–34.0)
MCHC: 33 g/dL (ref 30.0–36.0)
MCV: 94.4 fL (ref 78.0–100.0)
Platelets: 143 10*3/uL — ABNORMAL LOW (ref 150–400)
RBC: 4.43 MIL/uL (ref 4.22–5.81)
RDW: 13.6 % (ref 11.5–15.5)
WBC: 7.6 10*3/uL (ref 4.0–10.5)

## 2015-10-30 LAB — BASIC METABOLIC PANEL
ANION GAP: 10 (ref 5–15)
BUN: 10 mg/dL (ref 6–20)
CALCIUM: 9.4 mg/dL (ref 8.9–10.3)
CO2: 24 mmol/L (ref 22–32)
Chloride: 107 mmol/L (ref 101–111)
Creatinine, Ser: 1.05 mg/dL (ref 0.61–1.24)
GFR calc Af Amer: >60 mL/min (ref >60)
Glucose, Bld: 108 mg/dL — ABNORMAL HIGH (ref 65–99)
POTASSIUM: 4.1 mmol/L (ref 3.5–5.1)
SODIUM: 141 mmol/L (ref 135–145)

## 2015-10-30 LAB — URINALYSIS, ROUTINE W REFLEX MICROSCOPIC
BILIRUBIN URINE: NEGATIVE
Glucose, UA: NEGATIVE mg/dL
Hgb urine dipstick: NEGATIVE
KETONES UR: NEGATIVE mg/dL
LEUKOCYTES UA: NEGATIVE
NITRITE: NEGATIVE
PH: 6.5 (ref 5.0–8.0)
Protein, ur: NEGATIVE mg/dL
Specific Gravity, Urine: 1.019 (ref 1.005–1.030)

## 2015-10-30 LAB — CBG MONITORING, ED: GLUCOSE-CAPILLARY: 96 mg/dL (ref 65–99)

## 2015-10-30 MED ORDER — PANTOPRAZOLE SODIUM 40 MG PO TBEC
80.0000 mg | DELAYED_RELEASE_TABLET | Freq: Every day | ORAL | Status: DC
  Administered 2015-10-30 – 2015-11-01 (×3): 80 mg via ORAL
  Filled 2015-10-30 (×4): qty 2

## 2015-10-30 MED ORDER — ROSUVASTATIN CALCIUM 10 MG PO TABS
10.0000 mg | ORAL_TABLET | Freq: Every day | ORAL | Status: DC
  Administered 2015-10-30 – 2015-10-31 (×2): 10 mg via ORAL
  Filled 2015-10-30 (×2): qty 1

## 2015-10-30 MED ORDER — HEPARIN SODIUM (PORCINE) 5000 UNIT/ML IJ SOLN
5000.0000 [IU] | Freq: Three times a day (TID) | INTRAMUSCULAR | Status: DC
  Administered 2015-10-30 – 2015-11-01 (×5): 5000 [IU] via SUBCUTANEOUS
  Filled 2015-10-30 (×5): qty 1

## 2015-10-30 MED ORDER — HYDROCORTISONE 1 % EX CREA
1.0000 "application " | TOPICAL_CREAM | Freq: Two times a day (BID) | CUTANEOUS | Status: DC
  Administered 2015-10-31: 1 via TOPICAL
  Filled 2015-10-30: qty 28

## 2015-10-30 MED ORDER — DARIFENACIN HYDROBROMIDE ER 15 MG PO TB24
15.0000 mg | ORAL_TABLET | Freq: Every day | ORAL | Status: DC
  Administered 2015-10-31 – 2015-11-01 (×2): 15 mg via ORAL
  Filled 2015-10-30 (×2): qty 1

## 2015-10-30 MED ORDER — TAMSULOSIN HCL 0.4 MG PO CAPS
0.4000 mg | ORAL_CAPSULE | Freq: Every day | ORAL | Status: DC
  Administered 2015-10-31: 0.4 mg via ORAL
  Filled 2015-10-30: qty 1

## 2015-10-30 MED ORDER — CLONAZEPAM 0.5 MG PO TABS
1.0000 mg | ORAL_TABLET | Freq: Two times a day (BID) | ORAL | Status: DC | PRN
  Administered 2015-10-31: 1 mg via ORAL
  Filled 2015-10-30: qty 2

## 2015-10-30 MED ORDER — ASPIRIN EC 81 MG PO TBEC
81.0000 mg | DELAYED_RELEASE_TABLET | Freq: Every day | ORAL | Status: DC
  Administered 2015-10-31 – 2015-11-01 (×2): 81 mg via ORAL
  Filled 2015-10-30 (×2): qty 1

## 2015-10-30 MED ORDER — LUBIPROSTONE 24 MCG PO CAPS
24.0000 ug | ORAL_CAPSULE | Freq: Two times a day (BID) | ORAL | Status: DC
  Administered 2015-10-30 – 2015-11-01 (×4): 24 ug via ORAL
  Filled 2015-10-30 (×7): qty 1

## 2015-10-30 MED ORDER — METFORMIN HCL ER 500 MG PO TB24
500.0000 mg | ORAL_TABLET | Freq: Every day | ORAL | Status: DC
  Administered 2015-10-30 – 2015-10-31 (×2): 500 mg via ORAL
  Filled 2015-10-30 (×2): qty 1

## 2015-10-30 MED ORDER — OXYCODONE HCL 5 MG PO TABS
15.0000 mg | ORAL_TABLET | Freq: Four times a day (QID) | ORAL | Status: DC
  Administered 2015-10-30 – 2015-11-01 (×5): 15 mg via ORAL
  Filled 2015-10-30 (×5): qty 3

## 2015-10-30 MED ORDER — STROKE: EARLY STAGES OF RECOVERY BOOK
Freq: Once | Status: AC
  Administered 2015-10-31: 1
  Filled 2015-10-30: qty 1

## 2015-10-30 MED ORDER — MIRABEGRON ER 50 MG PO TB24
50.0000 mg | ORAL_TABLET | Freq: Every day | ORAL | Status: DC
  Administered 2015-10-31 – 2015-11-01 (×2): 50 mg via ORAL
  Filled 2015-10-30 (×2): qty 1

## 2015-10-30 NOTE — ED Notes (Signed)
Pt returned form CT.

## 2015-10-30 NOTE — H&P (Signed)
[facility] History and Physical  Antonio Le [id number] DOB: 1935/08/07 DOA: 10/30/2015  Referring physician: EDP PCP: [name], MD   Chief Complaint: Syncope   HPI: Antonio Le is a 79 y.o. male with h/o 3rd degree heart block, pacemaker, patient presents to the ED with c/o syncope last night.  Was unable to speak out, tried to get off commode, collapsed and hit head, had LOC for couple of hours, when he woke up was back to normal, got himself up and went to bed.  Has some numbness and tingling in R hand today but no other symptoms.  Spoke with daughter who told patient to come in to ED.  Review of Systems: Systems reviewed.  As above, otherwise negative  Past Medical History  Diagnosis Date  . CAD (coronary artery disease)     cath 01/12 no CAD, no records of reported silent MI in [facility], in [place], normal [place] 2007, EF 123456, mild diastolic dysfunction  . 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 [name] Comes  . Hyperlipidemia   . Hypertension   . GERD (gastroesophageal reflux disease)   . 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 Chronic constipation     GI referral to Dr. Oletta Lamas in the past, pt did not go  . Idiopathic acute pancreatitis   . Syncopal episodes   . Renal insufficiency     creatinine ~1.2 in the past  . H. pylori infection     PUD association  . History of alcohol abuse   . Glaucoma, open angle   . Cervical spondylarthritis   . Tinea cruris    Past Surgical History  Procedure Laterality Date  . Left hip replacement    . Cataract extraction     Social History:  reports that he has never smoked. He does not have any smokeless tobacco history on file. He reports that he does not drink alcohol or use illicit drugs.  No Known Allergies  Family History  Problem Relation Age of Onset  . Diabetes Mother      Prior to Admission medications    Medication Sig Start Date End Date Taking? Authorizing Provider  AMITIZA 24 MCG capsule Take 24 mg by mouth 2 (two) times daily. 10/10/15  Yes Historical Provider, MD  aspirin 81 MG tablet Take 81 mg by mouth daily.    Yes Historical Provider, MD  clonazePAM (KLONOPIN) 0.5 MG tablet Take 1 mg by mouth 2 (two) times daily as needed for anxiety. For anxiety   Yes Historical Provider, MD  darifenacin (ENABLEX) 15 MG 24 hr tablet Take 15 mg by mouth daily.   Yes Historical Provider, MD  hydrocortisone cream 1 % Apply to affected area 2 times daily Patient taking differently: Apply 1 application topically. Apply to affected area 2 times daily 05/17/15  Yes Everlene Balls, MD  metFORMIN (GLUCOPHAGE-XR) 500 MG 24 hr tablet Take 500 mg by mouth at bedtime.    Yes Historical Provider, MD  MYRBETRIQ 50 MG TB24 tablet Take 50 mg by mouth daily. 08/31/15  Yes Historical Provider, MD  omeprazole (PRILOSEC) 40 MG capsule Take 40 mg by mouth daily.    Yes Historical Provider, MD  oxyCODONE (ROXICODONE) 15 MG immediate release tablet Take 15 mg by mouth 4 (four) times daily. Take every day per patient 05/04/15  Yes Historical Provider, MD  rosuvastatin (CRESTOR) 20 MG tablet  Take 10 mg by mouth daily.    Yes Historical Provider, MD  Specialty Vitamins Products (PROSTATE PO) Take 1 tablet by mouth 2 (two) times daily.   Yes Historical Provider, MD  tamsulosin (FLOMAX) 0.4 MG CAPS capsule Take 0.4 mg by mouth daily after supper.  12/19/14  Yes Historical Provider, MD  vitamin C (ASCORBIC ACID) 500 MG tablet Take 500 mg by mouth daily.   Yes Historical Provider, MD   Physical Exam: Filed Vitals:   10/30/15 1815 10/30/15 1845  BP: 149/79 137/71  Pulse: 51 50  Temp:    Resp: 19 17    BP 137/71 mmHg  Pulse 50  Temp(Src) 97.9 F (36.6 C) (Oral)  Resp 17  Ht 5' 5.5" (1.664 m)  Wt 81.647 kg (180 lb)  BMI 29.49 kg/m2  SpO2 99%  General Appearance:    Alert, oriented, no distress, appears stated age  Head:     Normocephalic, atraumatic  Eyes:    PERRL, EOMI, sclera non-icteric        Nose:   Nares without drainage or epistaxis. Mucosa, turbinates normal  Throat:   Moist mucous membranes. Oropharynx without erythema or exudate.  Neck:   Supple. No carotid bruits.  No thyromegaly.  No lymphadenopathy.   Back:     No CVA tenderness, no spinal tenderness  Lungs:     Clear to auscultation bilaterally, without wheezes, rhonchi or rales  Chest wall:    No tenderness to palpitation  Heart:    Regular rate and rhythm without murmurs, gallops, rubs  Abdomen:     Soft, non-tender, nondistended, normal bowel sounds, no organomegaly  Genitalia:    deferred  Rectal:    deferred  Extremities:   No clubbing, cyanosis or edema.  Pulses:   2+ and symmetric all extremities  Skin:   Skin color, texture, turgor normal, no rashes or lesions  Lymph nodes:   Cervical, supraclavicular, and axillary nodes normal  Neurologic:   CNII-XII intact. Normal strength, sensation and reflexes      throughout    Labs on Admission:  Basic Metabolic Panel:  Recent Labs Lab 10/30/15 1507  NA 141  K 4.1  CL 107  CO2 24  GLUCOSE 108*  BUN 10  CREATININE 1.05  CALCIUM 9.4   Liver Function Tests: No results for input(s): AST, ALT, ALKPHOS, BILITOT, PROT, ALBUMIN in the last 168 hours. No results for input(s): LIPASE, AMYLASE in the last 168 hours. No results for input(s): AMMONIA in the last 168 hours. CBC:  Recent Labs Lab 10/30/15 1507  WBC 7.6  HGB 13.8  HCT 41.8  MCV 94.4  PLT 143*   Cardiac Enzymes: No results for input(s): CKTOTAL, CKMB, CKMBINDEX, TROPONINI in the last 168 hours.  BNP (last 3 results) No results for input(s): PROBNP in the last 8760 hours. CBG:  Recent Labs Lab 10/30/15 1707  GLUCAP 96    Radiological Exams on Admission: Ct Head Wo Contrast  10/30/2015  CLINICAL DATA:  80 year old male with a syncopal episode yesterday when standing up after using the bathroom EXAM: CT HEAD  WITHOUT CONTRAST CT CERVICAL SPINE WITHOUT CONTRAST TECHNIQUE: Multidetector CT imaging of the head and cervical spine was performed following the standard protocol without intravenous contrast. Multiplanar CT image reconstructions of the cervical spine were also generated. COMPARISON:  Prior CT scan of the head and cervical spine 12/06/2013 FINDINGS: CT HEAD FINDINGS Negative for acute intracranial hemorrhage, acute infarction, mass, mass effect, hydrocephalus or midline shift. Gray-white  differentiation is preserved throughout. Moderate periventricular white matter hypoattenuation consistent with chronic microvascular ischemic white matter disease. No significant interval progression. Trace atherosclerotic calcifications in the bilateral cavernous carotid arteries. Normal aeration of the mastoid air cells and visualized paranasal sinuses. No focal soft tissue or calvarial abnormality. Globes and orbits are symmetric and unremarkable bilaterally. CT CERVICAL SPINE FINDINGS No acute fracture, malalignment or prevertebral soft tissue swelling. Multilevel cervical spondylosis most significant at C7-T1 were there is likely mild central canal stenosis. Ankylosis of the anterior and posterior elements at C4-C5. Bilateral facet arthropathy. Unremarkable CT appearance of the thyroid gland. No acute soft tissue abnormality. The lung apices are unremarkable. Incompletely imaged left subclavian approach cardiac rhythm maintenance device wires. IMPRESSION: CT HEAD 1. No acute intracranial abnormality. 2. Moderate chronic microvascular ischemic white matter disease. CT CSPINE 1. No acute fracture or malalignment. 2. Chronic ankylosis of C4-C5 3. Multilevel cervical spondylosis most significant at C7-T1. 4. Bilateral facet arthropathy. Electronically Signed   By: Jacqulynn Cadet M.D.   On: 10/30/2015 17:55   Ct Cervical Spine Wo Contrast  10/30/2015  CLINICAL DATA:  80 year old male with a syncopal episode yesterday when  standing up after using the bathroom EXAM: CT HEAD WITHOUT CONTRAST CT CERVICAL SPINE WITHOUT CONTRAST TECHNIQUE: Multidetector CT imaging of the head and cervical spine was performed following the standard protocol without intravenous contrast. Multiplanar CT image reconstructions of the cervical spine were also generated. COMPARISON:  Prior CT scan of the head and cervical spine 12/06/2013 FINDINGS: CT HEAD FINDINGS Negative for acute intracranial hemorrhage, acute infarction, mass, mass effect, hydrocephalus or midline shift. Gray-white differentiation is preserved throughout. Moderate periventricular white matter hypoattenuation consistent with chronic microvascular ischemic white matter disease. No significant interval progression. Trace atherosclerotic calcifications in the bilateral cavernous carotid arteries. Normal aeration of the mastoid air cells and visualized paranasal sinuses. No focal soft tissue or calvarial abnormality. Globes and orbits are symmetric and unremarkable bilaterally. CT CERVICAL SPINE FINDINGS No acute fracture, malalignment or prevertebral soft tissue swelling. Multilevel cervical spondylosis most significant at C7-T1 were there is likely mild central canal stenosis. Ankylosis of the anterior and posterior elements at C4-C5. Bilateral facet arthropathy. Unremarkable CT appearance of the thyroid gland. No acute soft tissue abnormality. The lung apices are unremarkable. Incompletely imaged left subclavian approach cardiac rhythm maintenance device wires. IMPRESSION: CT HEAD 1. No acute intracranial abnormality. 2. Moderate chronic microvascular ischemic white matter disease. CT CSPINE 1. No acute fracture or malalignment. 2. Chronic ankylosis of C4-C5 3. Multilevel cervical spondylosis most significant at C7-T1. 4. Bilateral facet arthropathy. Electronically Signed   By: Jacqulynn Cadet M.D.   On: 10/30/2015 17:55    EKG: Independently reviewed.  Assessment/Plan Active Problems:    Syncope and collapse   1. Syncope and collapse - 1. Pacemaker interrogation is negative 2. Admitting for TIA work up 3. On interrogation, it is revealed that patients pacemaker is a "Revo MRI RVDR01" model 1. Apparently this is a new "MR conditional" pacemaker 2. Will go ahead and order MRI, radiologist should call EP if any questions. 4. Tele monitor    Code Status: Full Code  Family Communication: No family in room Disposition Plan: Admit to obs   Time spent: 70 min  GARDNER, JARED M. [facility] Pager [phone]  If 7AM-7PM, please contact the day team taking care of the patient [email or web address] Password [facility] 10/30/2015, 8:14 PM

## 2015-10-30 NOTE — ED Notes (Signed)
MD to go and update patient. Pt comfortable.

## 2015-10-30 NOTE — ED Provider Notes (Signed)
CSN: [id number]     Arrival date & time 10/30/15  1309 History   First MD Initiated Contact with Patient 10/30/15 1636     Chief Complaint  Patient presents with  . Loss of Consciousness     (Consider location/radiation/quality/duration/timing/severity/associated sxs/prior Treatment) Patient is a 80 y.o. male presenting with syncope. The history is provided by the patient.  Loss of Consciousness Episode history:  Single Most recent episode:  Yesterday Duration:  2 hours Timing:  Constant Progression:  Resolved Chronicity:  New Context: sitting down   Context: not blood draw, not bowel movement, not dehydration, not exertion, not inactivity, not medication change, not with normal activity, not sight of blood, not standing up and not urination   Witnessed: no   Relieved by:  Nothing Worsened by:  Nothing tried Ineffective treatments:  None tried Associated symptoms: focal sensory loss, focal weakness and weakness   Associated symptoms: no anxiety, no chest pain, no confusion, no diaphoresis, no difficulty breathing, no dizziness, no fever, no headaches, no malaise/fatigue, no nausea, no palpitations, no recent fall, no recent injury, no recent surgery, no rectal bleeding, no seizures, no shortness of breath, no visual change and no vomiting   Risk factors: no seizures     Past Medical History  Diagnosis Date  . CAD (coronary artery disease)     cath 01/12 no CAD, no records of reported silent MI in [facility], in [place], normal [place] 2007, EF [id number], mild diastolic dysfunction  . 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 [name] Comes  . Hyperlipidemia   . Hypertension   . GERD (gastroesophageal reflux disease)   . 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 Chronic constipation     GI referral to Dr. Oletta Lamas in the past, pt did not go  . Idiopathic acute pancreatitis   . Syncopal episodes   .  Renal insufficiency     creatinine ~1.2 in the past  . H. pylori infection     PUD association  . History of alcohol abuse   . Glaucoma, open angle   . Cervical spondylarthritis   . Tinea cruris    Past Surgical History  Procedure Laterality Date  . Left hip replacement    . Cataract extraction     Family History  Problem Relation Age of Onset  . Diabetes Mother    Social History  Substance Use Topics  . Smoking status: Never Smoker   . Smokeless tobacco: None  . Alcohol Use: No     Comment: stopped 1974    Review of Systems  Constitutional: Negative for fever, malaise/fatigue and diaphoresis.  Eyes: Negative for pain.  Respiratory: Negative for chest tightness and shortness of breath.   Cardiovascular: Positive for syncope. Negative for chest pain and palpitations.  Gastrointestinal: Negative for nausea, vomiting and blood in stool.  Genitourinary: Negative for dysuria.  Neurological: Positive for focal weakness, weakness and numbness. Negative for dizziness, seizures, syncope, facial asymmetry and headaches.  Psychiatric/Behavioral: Negative for confusion.      Allergies  Review of patient's allergies indicates no known allergies.  Home Medications   Prior to Admission medications   Medication Sig Start Date End Date Taking? Authorizing Provider  AMITIZA 24 MCG capsule Take 24 mg by mouth 2 (two) times daily. 10/10/15  Yes Historical Provider, MD  aspirin 81 MG tablet Take 81 mg by mouth daily.  Yes Historical Provider, MD  clonazePAM (KLONOPIN) 0.5 MG tablet Take 1 mg by mouth 2 (two) times daily as needed for anxiety. For anxiety   Yes Historical Provider, MD  darifenacin (ENABLEX) 15 MG 24 hr tablet Take 15 mg by mouth daily.   Yes Historical Provider, MD  hydrocortisone cream 1 % Apply to affected area 2 times daily Patient taking differently: Apply 1 application topically. Apply to affected area 2 times daily 05/17/15  Yes Everlene Balls, MD  metFORMIN  (GLUCOPHAGE-XR) 500 MG 24 hr tablet Take 500 mg by mouth at bedtime.    Yes Historical Provider, MD  MYRBETRIQ 50 MG TB24 tablet Take 50 mg by mouth daily. 08/31/15  Yes Historical Provider, MD  omeprazole (PRILOSEC) 40 MG capsule Take 40 mg by mouth daily.    Yes Historical Provider, MD  oxyCODONE (ROXICODONE) 15 MG immediate release tablet Take 15 mg by mouth 4 (four) times daily. Take every day per patient 05/04/15  Yes Historical Provider, MD  rosuvastatin (CRESTOR) 20 MG tablet Take 10 mg by mouth daily.    Yes Historical Provider, MD  Specialty Vitamins Products (PROSTATE PO) Take 1 tablet by mouth 2 (two) times daily.   Yes Historical Provider, MD  tamsulosin (FLOMAX) 0.4 MG CAPS capsule Take 0.4 mg by mouth daily after supper.  12/19/14  Yes Historical Provider, MD  vitamin C (ASCORBIC ACID) 500 MG tablet Take 500 mg by mouth daily.   Yes Historical Provider, MD   BP 137/71 mmHg  Pulse 50  Temp(Src) 97.9 F (36.6 C) (Oral)  Resp 17  Ht 5' 5.5" (1.664 m)  Wt 81.647 kg  BMI 29.49 kg/m2  SpO2 99% Physical Exam  Constitutional: He is oriented to person, place, and time. He appears well-developed and well-nourished. No distress.  HENT:  Head: Normocephalic and atraumatic.  Eyes: Conjunctivae and EOM are normal. Pupils are equal, round, and reactive to light.  Neck: Normal range of motion. Neck supple.  Cardiovascular: Normal rate, regular rhythm and normal heart sounds.  Exam reveals no gallop and no friction rub.   No murmur heard. Pulmonary/Chest: Effort normal and breath sounds normal. No respiratory distress. He has no wheezes. He has no rales. He exhibits no tenderness.  Abdominal: Soft. Bowel sounds are normal. He exhibits no distension and no mass. There is no tenderness. There is no rebound and no guarding.  Neurological: He is alert and oriented to person, place, and time. He has normal strength. No cranial nerve deficit or sensory deficit. Coordination normal. GCS eye subscore  is 4. GCS verbal subscore is 5. GCS motor subscore is 6.  Normal finger to nose and heel to shin exam  Skin: Skin is warm and dry. He is not diaphoretic.  Psychiatric: He has a normal mood and affect.    ED Course  Procedures (including critical care time) Labs Review Labs Reviewed  BASIC METABOLIC PANEL - Abnormal; Notable for the following:    Glucose, Bld 108 (*)    All other components within normal limits  CBC - Abnormal; Notable for the following:    Platelets 143 (*)    All other components within normal limits  URINALYSIS, ROUTINE W REFLEX MICROSCOPIC (NOT AT [facility])  CBG MONITORING, ED    Imaging Review Ct Head Wo Contrast  10/30/2015  CLINICAL DATA:  80 year old male with a syncopal episode yesterday when standing up after using the bathroom EXAM: CT HEAD WITHOUT CONTRAST CT CERVICAL SPINE WITHOUT CONTRAST TECHNIQUE: Multidetector CT imaging of  the head and cervical spine was performed following the standard protocol without intravenous contrast. Multiplanar CT image reconstructions of the cervical spine were also generated. COMPARISON:  Prior CT scan of the head and cervical spine 12/06/2013 FINDINGS: CT HEAD FINDINGS Negative for acute intracranial hemorrhage, acute infarction, mass, mass effect, hydrocephalus or midline shift. Gray-white differentiation is preserved throughout. Moderate periventricular white matter hypoattenuation consistent with chronic microvascular ischemic white matter disease. No significant interval progression. Trace atherosclerotic calcifications in the bilateral cavernous carotid arteries. Normal aeration of the mastoid air cells and visualized paranasal sinuses. No focal soft tissue or calvarial abnormality. Globes and orbits are symmetric and unremarkable bilaterally. CT CERVICAL SPINE FINDINGS No acute fracture, malalignment or prevertebral soft tissue swelling. Multilevel cervical spondylosis most significant at C7-T1 were there is likely mild central  canal stenosis. Ankylosis of the anterior and posterior elements at C4-C5. Bilateral facet arthropathy. Unremarkable CT appearance of the thyroid gland. No acute soft tissue abnormality. The lung apices are unremarkable. Incompletely imaged left subclavian approach cardiac rhythm maintenance device wires. IMPRESSION: CT HEAD 1. No acute intracranial abnormality. 2. Moderate chronic microvascular ischemic white matter disease. CT CSPINE 1. No acute fracture or malalignment. 2. Chronic ankylosis of C4-C5 3. Multilevel cervical spondylosis most significant at C7-T1. 4. Bilateral facet arthropathy. Electronically Signed   By: Jacqulynn Cadet M.D.   On: 10/30/2015 17:55   Ct Cervical Spine Wo Contrast  10/30/2015  CLINICAL DATA:  80 year old male with a syncopal episode yesterday when standing up after using the bathroom EXAM: CT HEAD WITHOUT CONTRAST CT CERVICAL SPINE WITHOUT CONTRAST TECHNIQUE: Multidetector CT imaging of the head and cervical spine was performed following the standard protocol without intravenous contrast. Multiplanar CT image reconstructions of the cervical spine were also generated. COMPARISON:  Prior CT scan of the head and cervical spine 12/06/2013 FINDINGS: CT HEAD FINDINGS Negative for acute intracranial hemorrhage, acute infarction, mass, mass effect, hydrocephalus or midline shift. Gray-white differentiation is preserved throughout. Moderate periventricular white matter hypoattenuation consistent with chronic microvascular ischemic white matter disease. No significant interval progression. Trace atherosclerotic calcifications in the bilateral cavernous carotid arteries. Normal aeration of the mastoid air cells and visualized paranasal sinuses. No focal soft tissue or calvarial abnormality. Globes and orbits are symmetric and unremarkable bilaterally. CT CERVICAL SPINE FINDINGS No acute fracture, malalignment or prevertebral soft tissue swelling. Multilevel cervical spondylosis most  significant at C7-T1 were there is likely mild central canal stenosis. Ankylosis of the anterior and posterior elements at C4-C5. Bilateral facet arthropathy. Unremarkable CT appearance of the thyroid gland. No acute soft tissue abnormality. The lung apices are unremarkable. Incompletely imaged left subclavian approach cardiac rhythm maintenance device wires. IMPRESSION: CT HEAD 1. No acute intracranial abnormality. 2. Moderate chronic microvascular ischemic white matter disease. CT CSPINE 1. No acute fracture or malalignment. 2. Chronic ankylosis of C4-C5 3. Multilevel cervical spondylosis most significant at C7-T1. 4. Bilateral facet arthropathy. Electronically Signed   By: Jacqulynn Cadet M.D.   On: 10/30/2015 17:55   I have personally reviewed and evaluated these images and lab results as part of my medical decision-making.   EKG Interpretation   Date/Time:  Monday October 30 2015 14:05:57 EST Ventricular Rate:  55 PR Interval:  174 QRS Duration: 196 QT Interval:  534 QTC Calculation: 510 R Axis:   -46 Text Interpretation:  AV dual-paced rhythm Abnormal ECG since last tracing  no significant change Confirmed by MILLER  MD, BRIAN ([id number]) on 10/30/2015  4:54:17 PM  MDM   Final diagnoses:  Syncope and collapse    80 year old African-American male with past medical history of coronary artery disease status post pacemaker presents in setting of possible syncope last night. Per patient he was sitting on the toilet getting ready to urinate approximately 10:00 last night when he had numbness and weakness in his right arm. This progressed to right leg. He reports he attempted to call out to caregiver who lives in home but was unable to speak at that time. He reports he then "felt my tongue rolled back in my throat". He reports he fell down to the ground striking head on wall and was unable to get up for approximately 2 hours. He reports when he awoke at 1:00 in the morning he was able to  talk and ambulate. He reports since that time he has had no return of symptoms and been able to do normal daily activities. He reports some mild coldness to right leg and some mild pain to right hand. Otherwise neurovascularly intact. Patient reports he talked to her daughter today about this and due to her concern presenting to the emergency department.  On arrival patient is hemodynamically stable and EKG showed pacemaker rhythm. Patient was neurovascular intact examination without any focal neurologic deficit. Normal finger to nose exam. In setting of patient's possible syncope versus TIA we'll obtain CT head, C-spine and electrolyte abnormalities. Additional obtain infectious workup to rule out other significant abnormalities. Patient will have the pacemaker interrogated.   No severe abnormalities noted CT head or C-spine. No significant electrolyte abnormalities noted. No signs of infection. Patient remains neurovascularly intact on examination. Patient is not a candidate for an MRI at this time due to pacemaker. In setting of patient's syncope versus TIA we will admit this at this time to medicine for further management. Patient stable at time of admission. I discussed case with Dr. Fabio Neighbors of [facility]. No events on pacemaker after interrogation.  Attending has seen and evaluated patient and Dr. Sabra Heck is in agreement with plan.    Esaw Grandchild, MD 10/30/15 2209  Noemi Chapel, MD 10/31/15 [phone]

## 2015-10-30 NOTE — ED Notes (Signed)
Report attempted. RN to call back.

## 2015-10-30 NOTE — ED Provider Notes (Signed)
Had R hand numbness when on commode last night - couldn't talk to call out - then he felt like he swallowed his tongue and became limp - fell and hit head on the wall - This was a 10 PM - at 1:30 he states he felt back to nrmal - got himself up and went to bed - his daughter urged him to come in -  On exam he has normal neuro exam but has slight pain in the R hand.    Neurologic exam:  Speech clear, pupils equal round reactive to light, extraocular movements intact Normal peripheral visual fields Cranial nerves III through XII normal including no facial droop Follows commands, moves all extremities x4, normal strength to bilateral upper and lower extremities at all major muscle groups including grip Sensation normal to light touch and pinprick Coordination intact, no limb ataxia, finger-nose-finger normal Rapid alternating movements normal No pronator drift Gait normal  Stroke w/u.  I saw and evaluated the patient, reviewed the resident's note and I agree with the findings and plan.   EKG Interpretation  Date/Time:  Monday October 30 2015 14:05:57 EST Ventricular Rate:  55 PR Interval:  174 QRS Duration: 196 QT Interval:  534 QTC Calculation: 510 R Axis:   -46 Text Interpretation:  AV dual-paced rhythm Abnormal ECG since last tracing no significant change Confirmed by Sabra Heck  MD, Tichina Koebel ([id number]) on 10/30/2015 4:54:17 PM        Final diagnoses:  Syncope and collapse      Noemi Chapel, MD 10/31/15 1030

## 2015-10-30 NOTE — ED Notes (Signed)
Pt given urinal for urine collection 

## 2015-10-30 NOTE — ED Notes (Signed)
hospitalist at the bedside 

## 2015-10-30 NOTE — ED Notes (Signed)
Pt states that he had a syncopal episode last night. Pt states that he has pain in his rt hand and leg that started today. No neuro deficits in triage.

## 2015-10-31 ENCOUNTER — Observation Stay (HOSPITAL_COMMUNITY): Payer: Medicare Other

## 2015-10-31 ENCOUNTER — Ambulatory Visit (HOSPITAL_BASED_OUTPATIENT_CLINIC_OR_DEPARTMENT_OTHER): Payer: Medicare Other

## 2015-10-31 ENCOUNTER — Encounter (HOSPITAL_COMMUNITY): Payer: Self-pay | Admitting: Radiology

## 2015-10-31 DIAGNOSIS — R55 Syncope and collapse: Secondary | ICD-10-CM | POA: Diagnosis not present

## 2015-10-31 DIAGNOSIS — G459 Transient cerebral ischemic attack, unspecified: Secondary | ICD-10-CM

## 2015-10-31 DIAGNOSIS — R200 Anesthesia of skin: Secondary | ICD-10-CM

## 2015-10-31 DIAGNOSIS — R531 Weakness: Secondary | ICD-10-CM | POA: Diagnosis not present

## 2015-10-31 DIAGNOSIS — R202 Paresthesia of skin: Secondary | ICD-10-CM | POA: Diagnosis not present

## 2015-10-31 LAB — LIPID PANEL
CHOLESTEROL: 117 mg/dL (ref 0–200)
HDL: 43 mg/dL (ref >40)
LDL CALC: 63 mg/dL (ref 0–99)
TRIGLYCERIDES: 54 mg/dL (ref <150)
Total CHOL/HDL Ratio: 2.7 RATIO
VLDL: 11 mg/dL (ref 0–40)

## 2015-10-31 LAB — GLUCOSE, CAPILLARY: GLUCOSE-CAPILLARY: 167 mg/dL — AB (ref 65–99)

## 2015-10-31 MED ORDER — PERFLUTREN LIPID MICROSPHERE
1.0000 mL | INTRAVENOUS | Status: AC | PRN
  Administered 2015-10-31: 3 mL via INTRAVENOUS
  Filled 2015-10-31: qty 10

## 2015-10-31 NOTE — Progress Notes (Addendum)
[facility] Progress Note   OTHOR VINES  [id number]  DOB: 1935-09-17  DOA: 10/30/2015 PCP: [name], MD  Brief narrative: CHALMUS VANDIJK is a 80 y.o. 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 He tried to call for help but then passed out. He woke up about 2 hrs later. His daughter insisted that he come to the hospital although he stated that he felt fine. He has had similar episodes in the past and no etiology was found.   Subjective: No complaints of chest pain, shortness of breath, focal numbness, tingling or weakness.   Assessment/Plan: Principal Problem:   Syncope and collapse - neuro work up ordered-CT head unrevealing-  MRI negative for CVA - no carotid stenosis noted on carotid duplex today - need to interrogate pacer as well - of note he is on Oxycodone 15 mg QID and Klonipin- he does not recall taking an excess of his prescribed doses - check orthostatic vitals-he is on Flomax    Code Status:     Code Status Orders        Start     Ordered   10/30/15 1951  Full code   Continuous     10/30/15 2003     Family Communication:   Disposition Plan: possibly home tomorrow DVT prophylaxis: Heparin Consultants: none Procedures: carotid duplex   Antibiotics: Anti-infectives    None      Objective: Filed Weights   10/30/15 1356 10/30/15 2032 10/31/15 0620  Weight: 81.647 kg (180 lb) 80.786 kg (178 lb 1.6 oz) 81.557 kg (179 lb 12.8 oz)    Intake/Output Summary (Last 24 hours) at 10/31/15 1136 Last data filed at 10/31/15 1027  Gross per 24 hour  Intake    480 ml  Output    875 ml  Net   -395 ml     Vitals Filed Vitals:   10/31/15 0050 10/31/15 0250 10/31/15 0620 10/31/15 0803  BP: 139/61 133/66    Pulse: 50 50    Temp:   98.9 F (37.2 C) 97.8 F (36.6 C)   TempSrc:   Oral Oral  Resp: 18 22    Height:      Weight:   81.557 kg (179 lb 12.8 oz)   SpO2: 95% 97%      Exam:  General:  Pt is alert, not in acute distress  HEENT: No icterus, No thrush, oral mucosa moist  Cardiovascular: regular rate and rhythm, S1/S2 No murmur  Respiratory: clear to auscultation bilaterally   Abdomen: Soft, +Bowel sounds, non tender, non distended, no guarding  MSK: No LE edema, cyanosis or clubbing  Data Reviewed: Basic Metabolic Panel:  Recent Labs Lab 10/30/15 1507  NA 141  K 4.1  CL 107  CO2 24  GLUCOSE 108*  BUN 10  CREATININE 1.05  CALCIUM 9.4   Liver Function Tests: No results for input(s): AST, ALT, ALKPHOS, BILITOT, PROT, ALBUMIN in the last 168 hours. No results for input(s): LIPASE, AMYLASE in the last 168 hours. No results for input(s): AMMONIA in the last 168 hours. CBC:  Recent Labs Lab 10/30/15 1507  WBC 7.6  HGB 13.8  HCT 41.8  MCV 94.4  PLT 143*   Cardiac Enzymes: No results for input(s): CKTOTAL, CKMB, CKMBINDEX, TROPONINI in the last 168 hours. BNP (  last 3 results) No results for input(s): BNP in the last 8760 hours.  ProBNP (last 3 results) No results for input(s): PROBNP in the last 8760 hours.  CBG:  Recent Labs Lab 10/30/15 1707  GLUCAP 96    No results found for this or any previous visit (from the past 240 hour(s)).   Studies: Ct Head Wo Contrast  10/30/2015  CLINICAL DATA:  80 year old male with a syncopal episode yesterday when standing up after using the bathroom EXAM: CT HEAD WITHOUT CONTRAST CT CERVICAL SPINE WITHOUT CONTRAST TECHNIQUE: Multidetector CT imaging of the head and cervical spine was performed following the standard protocol without intravenous contrast. Multiplanar CT image reconstructions of the cervical spine were also generated. COMPARISON:  Prior CT scan of the head and cervical spine 12/06/2013 FINDINGS: CT HEAD FINDINGS Negative for acute intracranial hemorrhage, acute  infarction, mass, mass effect, hydrocephalus or midline shift. Gray-white differentiation is preserved throughout. Moderate periventricular white matter hypoattenuation consistent with chronic microvascular ischemic white matter disease. No significant interval progression. Trace atherosclerotic calcifications in the bilateral cavernous carotid arteries. Normal aeration of the mastoid air cells and visualized paranasal sinuses. No focal soft tissue or calvarial abnormality. Globes and orbits are symmetric and unremarkable bilaterally. CT CERVICAL SPINE FINDINGS No acute fracture, malalignment or prevertebral soft tissue swelling. Multilevel cervical spondylosis most significant at C7-T1 were there is likely mild central canal stenosis. Ankylosis of the anterior and posterior elements at C4-C5. Bilateral facet arthropathy. Unremarkable CT appearance of the thyroid gland. No acute soft tissue abnormality. The lung apices are unremarkable. Incompletely imaged left subclavian approach cardiac rhythm maintenance device wires. IMPRESSION: CT HEAD 1. No acute intracranial abnormality. 2. Moderate chronic microvascular ischemic white matter disease. CT CSPINE 1. No acute fracture or malalignment. 2. Chronic ankylosis of C4-C5 3. Multilevel cervical spondylosis most significant at C7-T1. 4. Bilateral facet arthropathy. Electronically Signed   By: Jacqulynn Cadet M.D.   On: 10/30/2015 17:55   Ct Cervical Spine Wo Contrast  10/30/2015  CLINICAL DATA:  80 year old male with a syncopal episode yesterday when standing up after using the bathroom EXAM: CT HEAD WITHOUT CONTRAST CT CERVICAL SPINE WITHOUT CONTRAST TECHNIQUE: Multidetector CT imaging of the head and cervical spine was performed following the standard protocol without intravenous contrast. Multiplanar CT image reconstructions of the cervical spine were also generated. COMPARISON:  Prior CT scan of the head and cervical spine 12/06/2013 FINDINGS: CT HEAD FINDINGS  Negative for acute intracranial hemorrhage, acute infarction, mass, mass effect, hydrocephalus or midline shift. Gray-white differentiation is preserved throughout. Moderate periventricular white matter hypoattenuation consistent with chronic microvascular ischemic white matter disease. No significant interval progression. Trace atherosclerotic calcifications in the bilateral cavernous carotid arteries. Normal aeration of the mastoid air cells and visualized paranasal sinuses. No focal soft tissue or calvarial abnormality. Globes and orbits are symmetric and unremarkable bilaterally. CT CERVICAL SPINE FINDINGS No acute fracture, malalignment or prevertebral soft tissue swelling. Multilevel cervical spondylosis most significant at C7-T1 were there is likely mild central canal stenosis. Ankylosis of the anterior and posterior elements at C4-C5. Bilateral facet arthropathy. Unremarkable CT appearance of the thyroid gland. No acute soft tissue abnormality. The lung apices are unremarkable. Incompletely imaged left subclavian approach cardiac rhythm maintenance device wires. IMPRESSION: CT HEAD 1. No acute intracranial abnormality. 2. Moderate chronic microvascular ischemic white matter disease. CT CSPINE 1. No acute fracture or malalignment. 2. Chronic ankylosis of C4-C5 3. Multilevel cervical spondylosis most significant at C7-T1. 4. Bilateral facet arthropathy. Electronically Signed  By: Jacqulynn Cadet M.D.   On: 10/30/2015 17:55   Mr Brain Wo Contrast  10/31/2015  CLINICAL DATA:  80 year old male with acute onset right side weakness, numbness, syncope, loss of consciousness last night. Initial encounter. [employer] conditional pacemaker for which MRI guidelines for scanning were followed. Initial encounter. EXAM: MRI HEAD WITHOUT CONTRAST TECHNIQUE: Multiplanar, multiecho pulse sequences of the brain and surrounding structures were obtained without intravenous contrast. COMPARISON:  Head and cervical spine CT  10/30/2015 and earlier. FINDINGS: No restricted diffusion to suggest acute infarction. No midline shift, mass effect, evidence of mass lesion, ventriculomegaly, extra-axial collection or acute intracranial hemorrhage. Cervicomedullary junction and pituitary are within normal limits. Major intracranial vascular flow voids are preserved. Negative visualized cervical spine. Chronic lacunar infarcts in the right paracentral pons. Small chronic micro hemorrhage in the left occipital lobe (series 7, image 8). Mild to moderate T2 heterogeneity in the deep gray matter nuclei suspected due to small chronic lacunar infarcts. Patchy bilateral white matter T2 and FLAIR hyperintensity. No cortical encephalomalacia. Visible internal auditory structures appear normal. Mastoids are clear. Mild paranasal sinus mucosal thickening. Negative orbit and scalp soft tissues, postoperative changes to the globes. Normal bone marrow signal. IMPRESSION: 1.  No acute intracranial abnormality. 2. Moderate for age signal changes compatible with chronic small vessel disease, most pronounced in the bilateral cerebral white matter and right pons. Electronically Signed   By: Genevie Ann M.D.   On: 10/31/2015 09:47    Scheduled Meds:  Scheduled Meds: .  stroke: mapping our early stages of recovery book   Does not apply Once  . aspirin EC  81 mg Oral Daily  . darifenacin  15 mg Oral Daily  . heparin  5,000 Units Subcutaneous 3 times per day  . hydrocortisone cream  1 application Topical BID  . lubiprostone  24 mcg Oral BID WC  . metFORMIN  500 mg Oral QHS  . mirabegron ER  50 mg Oral Daily  . oxyCODONE  15 mg Oral QID  . pantoprazole  80 mg Oral Daily  . rosuvastatin  10 mg Oral q1800  . tamsulosin  0.4 mg Oral QPC supper   Continuous Infusions:   Time spent on care of this patient: 35 min   [place], MD 10/31/2015, 11:36 AM    [facility] Office  [phone] Pager - Text Page per [email or web address] If 7PM-7AM, please  contact night-coverage [email or web address]

## 2015-10-31 NOTE — Progress Notes (Signed)
PT Cancellation Note/Discharge  Patient Details Name: Antonio Le MRN: [id number] DOB: Apr 19, 1935   Cancelled Treatment:    Reason Eval/Treat Not Completed: OT screened, no needs identified, will sign off.  OT screened, no PT needs identified.  See OT evaluation note for details.    Thanks,    Barbarann Ehlers. Karon Heckendorn, PT, DPT [phone]   10/31/2015, 11:34 AM

## 2015-10-31 NOTE — Progress Notes (Signed)
VASCULAR LAB PRELIMINARY  PRELIMINARY  PRELIMINARY  PRELIMINARY  Carotid duplex completed.    Preliminary report:  Bilateral:  1-39% ICA stenosis.  Vertebral artery flow is antegrade.     Breeanne Oblinger, RVS 10/31/2015, 10:10 AM

## 2015-10-31 NOTE — Evaluation (Signed)
Occupational Therapy Evaluation Patient Details Name: Antonio Le MRN: [id number] DOB: 01/24/1935 Today's Date: 10/31/2015    History of Present Illness Pt admitted after syncopal episode with LOC x 2 hours and R side numbness and weakness which has resolved. MRI negative. PMH: 3rd degree heart block, pacemaker, CAD, HTN.   Clinical Impression   Pt demonstrated ability to perform ADL and ADL transfers at an independent level. No further OT needs. Educated pt in signs and symptoms of CVA and advised to avoid driving himself to the hospital if he should have another episode of syncope.   Follow Up Recommendations  No OT follow up    Equipment Recommendations       Recommendations for Other Services       Precautions / Restrictions        Mobility Bed Mobility Overal bed mobility: Independent                Transfers Overall transfer level: Independent Equipment used: Straight cane                  Balance                                            ADL Overall ADL's : Independent                                             Vision     Perception     Praxis      Pertinent Vitals/Pain Pain Assessment: No/denies pain     Hand Dominance Right   Extremity/Trunk Assessment Upper Extremity Assessment Upper Extremity Assessment: Overall WFL for tasks assessed   Lower Extremity Assessment Lower Extremity Assessment: Overall WFL for tasks assessed   Cervical / Trunk Assessment Cervical / Trunk Assessment: Normal   Communication Communication Communication: No difficulties   Cognition Arousal/Alertness: Awake/alert Behavior During Therapy: WFL for tasks assessed/performed Overall Cognitive Status: Within Functional Limits for tasks assessed                     General Comments       Exercises       Shoulder Instructions      Home Living Family/patient expects to be discharged to::  Private residence Living Arrangements: Non-relatives/Friends Available Help at Discharge: Friend(s);Available 24 hours/day Type of Home: House Home Access: Stairs to enter [employer] of Steps: 2 Entrance Stairs-Rails: Right Home Layout: One level     Bathroom Shower/Tub: Teacher, early years/pre: Handicapped height     Home Equipment: Cane - single point;Walker - 2 wheels;Grab bars - tub/shower          Prior Functioning/Environment Level of Independence: Independent with assistive device(s)        Comments: drives, uses cane to walk    OT Diagnosis: Generalized weakness   OT Problem List:     OT Treatment/Interventions:      OT Goals(Current goals can be found in the care plan section)    OT Frequency:     Barriers to D/C:            Co-evaluation              End of Session  Activity Tolerance: Patient tolerated treatment well Patient left: in bed;with call bell/phone within reach (with ECHO)   Time: [id number] OT Time Calculation (min): 20 min Charges:  OT General Charges $OT Visit: 1 Procedure OT Evaluation $Initial OT Evaluation Tier I: 1 Procedure G-Codes: OT G-codes **NOT FOR INPATIENT CLASS** Functional Assessment Tool Used: clinical judgement Functional Limitation: Self care Self Care Current Status [id number]): 0 percent impaired, limited or restricted Self Care Goal Status [id number]): 0 percent impaired, limited or restricted Self Care Discharge Status [id number]): 0 percent impaired, limited or restricted  Malka So 10/31/2015, 10:52 AM  [phone]

## 2015-10-31 NOTE — Progress Notes (Signed)
Echocardiogram 2D Echocardiogram has been performed.  Antonio Le 10/31/2015, 11:34 AM

## 2015-11-01 DIAGNOSIS — R55 Syncope and collapse: Secondary | ICD-10-CM | POA: Diagnosis not present

## 2015-11-01 DIAGNOSIS — E119 Type 2 diabetes mellitus without complications: Secondary | ICD-10-CM | POA: Diagnosis not present

## 2015-11-01 LAB — HEMOGLOBIN A1C
Hgb A1c MFr Bld: 6.3 % — ABNORMAL HIGH (ref 4.8–5.6)
Mean Plasma Glucose: 134 mg/dL

## 2015-11-01 LAB — GLUCOSE, CAPILLARY: GLUCOSE-CAPILLARY: 120 mg/dL — AB (ref 65–99)

## 2015-11-01 NOTE — Discharge Summary (Signed)
Physician Discharge Summary  Antonio Le [id number] DOB: 1935-03-13 DOA: 10/30/2015  PCP: [name], MD  Admit date: 10/30/2015 Discharge date: 11/01/2015  Time spent: 55 minutes   Discharge Condition: stable    Discharge Diagnoses:  Principal Problem:   Syncope and collapse Active Problems:   DM type 2, goal A1c below 7   History of present illness:  Antonio Le is a 80 y.o. male with h/o 3rd degree A-V block s/p pacer who presents for syncope while sitting ont the toilet around 10 pm on 10/25- he states he was "relaxing" on the toilet and using his phone when he started to feel lightheaded. He tried to call for help but then passed out. He woke up about 2 hrs later. His daughter insisted that he come to the hospital although he stated that he felt fine. He has had similar episodes in the past and no etiology was found.   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 which would only cause syncope if it causes and arrhythmia  -  interrogated pacer - no arrythmias - checked orthostatic vitals as he is on Flomax- not orthostatic - of note he is on Oxycodone 15 mg QID and Klonipin- he does not recall taking an excess of his prescribed doses - no cause for syncope found   Procedures:  Carotid duplex  ECHO  Consultations:     Discharge Exam: Filed Weights   10/30/15 2032 10/31/15 0620 11/01/15 0525  Weight: 80.786 kg (178 lb 1.6 oz) 81.557 kg (179 lb 12.8 oz) 83.416 kg (183 lb 14.4 oz)   Filed Vitals:   11/01/15 0800 11/01/15 0847  BP: 145/73   Pulse:  73  Temp:  97.5 F (36.4 C)  Resp:      General: AAO x 3, no distress Cardiovascular: RRR, no murmurs  Respiratory: clear to auscultation bilaterally GI: soft, non-tender,  non-distended, bowel sound positive  Discharge Instructions You were cared for by a hospitalist during your hospital stay. If you have any questions about your discharge medications or the care you received while you were in the hospital after you are discharged, you can call the unit and asked to speak with the hospitalist on call if the hospitalist that took care of you is not available. Once you are discharged, your primary care physician will handle any further medical issues. Please note that NO REFILLS for any discharge medications will be authorized once you are discharged, as it is imperative that you return to your primary care physician (or establish a relationship with a primary care physician if you do not have one) for your aftercare needs so that they can reassess your need for medications and monitor your lab values.  Discharge Instructions    Discharge instructions    Complete by:  As directed   Low salt, low fat, diabetic diet     Increase activity slowly    Complete by:  As directed             Medication List    TAKE these medications        AMITIZA 24 MCG capsule  Generic drug:  lubiprostone  Take 24 mcg by mouth 2 (two) times daily.     aspirin 81 MG tablet  Take 81 mg by mouth daily.  clonazePAM 0.5 MG tablet  Commonly known as:  KLONOPIN  Take 1 mg by mouth 2 (two) times daily as needed for anxiety. For anxiety     darifenacin 15 MG 24 hr tablet  Commonly known as:  ENABLEX  Take 15 mg by mouth daily.     hydrocortisone cream 1 %  Apply to affected area 2 times daily     metFORMIN 500 MG 24 hr tablet  Commonly known as:  GLUCOPHAGE-XR  Take 500 mg by mouth at bedtime.     MYRBETRIQ 50 MG Tb24 tablet  Generic drug:  mirabegron ER  Take 50 mg by mouth daily.     omeprazole 40 MG capsule  Commonly known as:  PRILOSEC  Take 40 mg by mouth daily.     oxyCODONE 15 MG immediate release tablet  Commonly known as:  ROXICODONE  Take 15 mg by mouth 4  (four) times daily. Take every day per patient     PROSTATE PO  Take 1 tablet by mouth 2 (two) times daily.     rosuvastatin 20 MG tablet  Commonly known as:  CRESTOR  Take 10 mg by mouth daily.     tamsulosin 0.4 MG Caps capsule  Commonly known as:  FLOMAX  Take 0.4 mg by mouth daily after supper.     vitamin C 500 MG tablet  Commonly known as:  ASCORBIC ACID  Take 500 mg by mouth daily.       No Known Allergies     Follow-up Information    Follow up with [name], MD. Go on 11/15/2015.   Specialty:  Internal Medicine   Why:  Hospital @ 945am   Contact information:   [address], [place] [address] [phone]        The results of significant diagnostics from this hospitalization (including imaging, microbiology, ancillary and laboratory) are listed below for reference.    Significant Diagnostic Studies: Ct Head Wo Contrast  10/30/2015  CLINICAL DATA:  80 year old male with a syncopal episode yesterday when standing up after using the bathroom EXAM: CT HEAD WITHOUT CONTRAST CT CERVICAL SPINE WITHOUT CONTRAST TECHNIQUE: Multidetector CT imaging of the head and cervical spine was performed following the standard protocol without intravenous contrast. Multiplanar CT image reconstructions of the cervical spine were also generated. COMPARISON:  Prior CT scan of the head and cervical spine 12/06/2013 FINDINGS: CT HEAD FINDINGS Negative for acute intracranial hemorrhage, acute infarction, mass, mass effect, hydrocephalus or midline shift. Gray-white differentiation is preserved throughout. Moderate periventricular white matter hypoattenuation consistent with chronic microvascular ischemic white matter disease. No significant interval progression. Trace atherosclerotic calcifications in the bilateral cavernous carotid arteries. Normal aeration of the mastoid air cells and visualized paranasal sinuses. No focal soft tissue or calvarial abnormality. Globes and  orbits are symmetric and unremarkable bilaterally. CT CERVICAL SPINE FINDINGS No acute fracture, malalignment or prevertebral soft tissue swelling. Multilevel cervical spondylosis most significant at C7-T1 were there is likely mild central canal stenosis. Ankylosis of the anterior and posterior elements at C4-C5. Bilateral facet arthropathy. Unremarkable CT appearance of the thyroid gland. No acute soft tissue abnormality. The lung apices are unremarkable. Incompletely imaged left subclavian approach cardiac rhythm maintenance device wires. IMPRESSION: CT HEAD 1. No acute intracranial abnormality. 2. Moderate chronic microvascular ischemic white matter disease. CT CSPINE 1. No acute fracture or malalignment. 2. Chronic ankylosis of C4-C5 3. Multilevel cervical spondylosis most significant at C7-T1. 4. Bilateral facet arthropathy. Electronically Signed   By: Myrle Sheng  Laurence Ferrari M.D.   On: 10/30/2015 17:55   Ct Cervical Spine Wo Contrast  10/30/2015  CLINICAL DATA:  80 year old male with a syncopal episode yesterday when standing up after using the bathroom EXAM: CT HEAD WITHOUT CONTRAST CT CERVICAL SPINE WITHOUT CONTRAST TECHNIQUE: Multidetector CT imaging of the head and cervical spine was performed following the standard protocol without intravenous contrast. Multiplanar CT image reconstructions of the cervical spine were also generated. COMPARISON:  Prior CT scan of the head and cervical spine 12/06/2013 FINDINGS: CT HEAD FINDINGS Negative for acute intracranial hemorrhage, acute infarction, mass, mass effect, hydrocephalus or midline shift. Gray-white differentiation is preserved throughout. Moderate periventricular white matter hypoattenuation consistent with chronic microvascular ischemic white matter disease. No significant interval progression. Trace atherosclerotic calcifications in the bilateral cavernous carotid arteries. Normal aeration of the mastoid air cells and visualized paranasal sinuses. No focal  soft tissue or calvarial abnormality. Globes and orbits are symmetric and unremarkable bilaterally. CT CERVICAL SPINE FINDINGS No acute fracture, malalignment or prevertebral soft tissue swelling. Multilevel cervical spondylosis most significant at C7-T1 were there is likely mild central canal stenosis. Ankylosis of the anterior and posterior elements at C4-C5. Bilateral facet arthropathy. Unremarkable CT appearance of the thyroid gland. No acute soft tissue abnormality. The lung apices are unremarkable. Incompletely imaged left subclavian approach cardiac rhythm maintenance device wires. IMPRESSION: CT HEAD 1. No acute intracranial abnormality. 2. Moderate chronic microvascular ischemic white matter disease. CT CSPINE 1. No acute fracture or malalignment. 2. Chronic ankylosis of C4-C5 3. Multilevel cervical spondylosis most significant at C7-T1. 4. Bilateral facet arthropathy. Electronically Signed   By: Jacqulynn Cadet M.D.   On: 10/30/2015 17:55   Mr Brain Wo Contrast  10/31/2015  CLINICAL DATA:  80 year old male with acute onset right side weakness, numbness, syncope, loss of consciousness last night. Initial encounter. Medtronic conditional pacemaker for which MRI guidelines for scanning were followed. Initial encounter. EXAM: MRI HEAD WITHOUT CONTRAST TECHNIQUE: Multiplanar, multiecho pulse sequences of the brain and surrounding structures were obtained without intravenous contrast. COMPARISON:  Head and cervical spine CT 10/30/2015 and earlier. FINDINGS: No restricted diffusion to suggest acute infarction. No midline shift, mass effect, evidence of mass lesion, ventriculomegaly, extra-axial collection or acute intracranial hemorrhage. Cervicomedullary junction and pituitary are within normal limits. Major intracranial vascular flow voids are preserved. Negative visualized cervical spine. Chronic lacunar infarcts in the right paracentral pons. Small chronic micro hemorrhage in the left occipital lobe  (series 7, image 8). Mild to moderate T2 heterogeneity in the deep gray matter nuclei suspected due to small chronic lacunar infarcts. Patchy bilateral white matter T2 and FLAIR hyperintensity. No cortical encephalomalacia. Visible internal auditory structures appear normal. Mastoids are clear. Mild paranasal sinus mucosal thickening. Negative orbit and scalp soft tissues, postoperative changes to the globes. Normal bone marrow signal. IMPRESSION: 1.  No acute intracranial abnormality. 2. Moderate for age signal changes compatible with chronic small vessel disease, most pronounced in the bilateral cerebral white matter and right pons. Electronically Signed   By: Genevie Ann M.D.   On: 10/31/2015 09:47    Microbiology: No results found for this or any previous visit (from the past 240 hour(s)).   Labs: Basic Metabolic Panel:  Recent Labs Lab 10/30/15 1507  NA 141  K 4.1  CL 107  CO2 24  GLUCOSE 108*  BUN 10  CREATININE 1.05  CALCIUM 9.4   Liver Function Tests: No results for input(s): AST, ALT, ALKPHOS, BILITOT, PROT, ALBUMIN in the last 168 hours. No results  for input(s): LIPASE, AMYLASE in the last 168 hours. No results for input(s): AMMONIA in the last 168 hours. CBC:  Recent Labs Lab 10/30/15 1507  WBC 7.6  HGB 13.8  HCT 41.8  MCV 94.4  PLT 143*   Cardiac Enzymes: No results for input(s): CKTOTAL, CKMB, CKMBINDEX, TROPONINI in the last 168 hours. BNP: BNP (last 3 results) No results for input(s): BNP in the last 8760 hours.  ProBNP (last 3 results) No results for input(s): PROBNP in the last 8760 hours.  CBG:  Recent Labs Lab 10/30/15 1707 10/31/15 2340 11/01/15 0744  GLUCAP 96 167* 120*       SignedDebbe Odea, MD [facility] 11/01/2015, 5:57 PM

## 2015-11-01 NOTE — Care Management Obs Status (Signed)
[facility] NOTIFICATION   Patient Details  Name: Antonio Le MRN: [id number] Date of Birth: August 31, 1935   Medicare Observation Status Notification Given:  Pt was d/c before letter could be provided.    Bethena Roys, RN 11/01/2015, 11:21 AM

## 2015-11-15 DIAGNOSIS — R55 Syncope and collapse: Secondary | ICD-10-CM | POA: Diagnosis not present

## 2015-11-15 DIAGNOSIS — Z950 Presence of cardiac pacemaker: Secondary | ICD-10-CM | POA: Diagnosis not present

## 2015-11-15 DIAGNOSIS — E7800 Pure hypercholesterolemia, unspecified: Secondary | ICD-10-CM | POA: Diagnosis not present

## 2015-11-15 DIAGNOSIS — E119 Type 2 diabetes mellitus without complications: Secondary | ICD-10-CM | POA: Diagnosis not present

## 2015-12-05 DIAGNOSIS — N489 Disorder of penis, unspecified: Secondary | ICD-10-CM | POA: Diagnosis not present

## 2015-12-05 DIAGNOSIS — N400 Enlarged prostate without lower urinary tract symptoms: Secondary | ICD-10-CM | POA: Diagnosis not present

## 2015-12-05 DIAGNOSIS — R300 Dysuria: Secondary | ICD-10-CM | POA: Diagnosis not present

## 2016-01-15 DIAGNOSIS — G894 Chronic pain syndrome: Secondary | ICD-10-CM | POA: Diagnosis not present

## 2016-01-15 DIAGNOSIS — Z79891 Long term (current) use of opiate analgesic: Secondary | ICD-10-CM | POA: Diagnosis not present

## 2016-01-15 DIAGNOSIS — M47812 Spondylosis without myelopathy or radiculopathy, cervical region: Secondary | ICD-10-CM | POA: Diagnosis not present

## 2016-01-15 DIAGNOSIS — M47816 Spondylosis without myelopathy or radiculopathy, lumbar region: Secondary | ICD-10-CM | POA: Diagnosis not present

## 2016-01-16 DIAGNOSIS — H40003 Preglaucoma, unspecified, bilateral: Secondary | ICD-10-CM | POA: Diagnosis not present

## 2016-01-16 DIAGNOSIS — E119 Type 2 diabetes mellitus without complications: Secondary | ICD-10-CM | POA: Diagnosis not present

## 2016-01-16 DIAGNOSIS — H469 Unspecified optic neuritis: Secondary | ICD-10-CM | POA: Diagnosis not present

## 2016-01-16 DIAGNOSIS — H26491 Other secondary cataract, right eye: Secondary | ICD-10-CM | POA: Diagnosis not present

## 2016-01-24 DIAGNOSIS — E7800 Pure hypercholesterolemia, unspecified: Secondary | ICD-10-CM | POA: Diagnosis not present

## 2016-01-24 DIAGNOSIS — E1165 Type 2 diabetes mellitus with hyperglycemia: Secondary | ICD-10-CM | POA: Diagnosis not present

## 2016-01-24 DIAGNOSIS — R55 Syncope and collapse: Secondary | ICD-10-CM | POA: Diagnosis not present

## 2016-01-24 DIAGNOSIS — E119 Type 2 diabetes mellitus without complications: Secondary | ICD-10-CM | POA: Diagnosis not present

## 2016-01-31 DIAGNOSIS — N390 Urinary tract infection, site not specified: Secondary | ICD-10-CM | POA: Diagnosis not present

## 2016-01-31 DIAGNOSIS — E119 Type 2 diabetes mellitus without complications: Secondary | ICD-10-CM | POA: Diagnosis not present

## 2016-01-31 DIAGNOSIS — Z1389 Encounter for screening for other disorder: Secondary | ICD-10-CM | POA: Diagnosis not present

## 2016-01-31 DIAGNOSIS — Q649 Congenital malformation of urinary system, unspecified: Secondary | ICD-10-CM | POA: Diagnosis not present

## 2016-01-31 DIAGNOSIS — K219 Gastro-esophageal reflux disease without esophagitis: Secondary | ICD-10-CM | POA: Diagnosis not present

## 2016-01-31 DIAGNOSIS — E784 Other hyperlipidemia: Secondary | ICD-10-CM | POA: Diagnosis not present

## 2016-02-16 ENCOUNTER — Ambulatory Visit (INDEPENDENT_AMBULATORY_CARE_PROVIDER_SITE_OTHER): Payer: Medicare Other | Admitting: Internal Medicine

## 2016-02-16 ENCOUNTER — Encounter: Payer: Self-pay | Admitting: Internal Medicine

## 2016-02-16 VITALS — BP 138/84 | HR 52 | Ht 65.0 in | Wt 182.0 lb

## 2016-02-16 DIAGNOSIS — Z950 Presence of cardiac pacemaker: Secondary | ICD-10-CM

## 2016-02-16 DIAGNOSIS — I442 Atrioventricular block, complete: Secondary | ICD-10-CM

## 2016-02-16 NOTE — Progress Notes (Signed)
Electrophysiology Office Note   Date:  02/16/2016   ID:  Antonio Le, DOB [birth year], MRN [id number]  PCP:  [name], MD  Cardiologist:    Primary Electrophysiologist:    Virl Axe, MD    No chief complaint on file.    History of Present Illness: Antonio Le is a 81 y.o. male is      Seen in follow for pacemaker implanted 2012 for sinus node dysfunction and progressive conduction system disease with ultimate complete heart block      Continues to have problems  with exercise tolerance and underwent Myoview scanning /14. Catheterization January 2012 demonstrated no obstructive coronary disease.  Echo  12/16 normal LV function with concern about apical hypertrophy  He been hospitalized 12/16 for syncope on the commode; he was unconscious for? 2 hours. Device interrogation demonstrated no arrhythmia. Was not orthostatic. Still presumably neurally mediated.  There is no peripheral edema orthopnea.  His wife of 49 years died in Mar 17, 2023. She had a very long fuuny first name. He called her Thur.  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     Past Medical History  Diagnosis Date  . CAD (coronary artery disease)     cath 01/12 no CAD, no records of reported silent MI in [facility], in [place], normal [place] 2007, EF [id number], mild diastolic dysfunction  . 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 [name] Comes  . Hyperlipidemia   . Hypertension   . GERD (gastroesophageal reflux disease)   . 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 Chronic constipation     GI referral to Dr. Oletta Lamas in the past, pt did not go  . Idiopathic acute pancreatitis   . Syncopal episodes   . Renal insufficiency     creatinine ~1.2 in the past  . H. pylori infection     PUD association  . History of alcohol abuse   . Glaucoma, open angle   .  Cervical spondylarthritis   . Tinea cruris    Past Surgical History  Procedure Laterality Date  . Left hip replacement    . Cataract extraction    . Pacemaker insertion      Revo Sure Scan pacemaker     Current Outpatient Prescriptions  Medication Sig Dispense Refill  . AMITIZA 24 MCG capsule Take 24 mcg by mouth 2 (two) times daily.   0  . aspirin 81 MG tablet Take 81 mg by mouth daily.     . ciprofloxacin (CIPRO) 500 MG tablet Take 1 tablet by mouth as directed.  0  . clonazePAM (KLONOPIN) 0.5 MG tablet Take 1 mg by mouth 2 (two) times daily as needed for anxiety. For anxiety    . darifenacin (ENABLEX) 15 MG 24 hr tablet Take 15 mg by mouth daily.    . metFORMIN (GLUCOPHAGE-XR) 500 MG 24 hr tablet Take 500 mg by mouth at bedtime.     Marland Kitchen MYRBETRIQ 50 MG TB24 tablet Take 50 mg by mouth daily.  0  . omeprazole (PRILOSEC) 40 MG capsule Take 40 mg by mouth daily.     Marland Kitchen oxyCODONE (ROXICODONE) 15 MG immediate release tablet Take 15 mg by mouth 4 (four) times daily. Take every day per patient  0  . rosuvastatin (CRESTOR) 20 MG tablet Take  10 mg by mouth daily.     Marland Kitchen Specialty Vitamins Products (PROSTATE PO) Take 1 tablet by mouth 2 (two) times daily.    . tamsulosin (FLOMAX) 0.4 MG CAPS capsule Take 0.4 mg by mouth daily after supper.   0  . vitamin C (ASCORBIC ACID) 500 MG tablet Take 500 mg by mouth daily.     No current facility-administered medications for this visit.    Allergies:   Review of patient's allergies indicates no known allergies.   Social History:  The patient  reports that he has never smoked. He does not have any smokeless tobacco history on file. He reports that he does not drink alcohol or use illicit drugs.   Family History:  The patient's   *family history includes Diabetes in his mother.    ROS:  Please see the history of present illness and past medical history  Otherwise, review of systems is negative .     PHYSICAL EXAM: VS:  BP 138/84 mmHg  Pulse 52  Ht  5\' 5"  (1.651 m)  Wt 182 lb (82.555 kg)  BMI 30.29 kg/m2 , BMI Body mass index is 30.29 kg/(m^2). GEN: Well nourished, well developed, in no acute distress HEENT: normal Neck:  JVD flat , carotid bruits, or masses Cardiac: REGULAR RATE and RHYTHM ; No murmurs, rubs, +  S4  Back without kyphosis; No CVAT Device pocket well healed; without hematoma or erythema.  There is no tethering  Respiratory:  clear to auscultation bilaterally, normal work of breathing GI: soft, nontender, nondistended, + BS MS: no deformity or atrophy Extremities no clubbing cyanosis   edema Skin: warm and dry,    Neuro:  Strength and sensation are intact Psych: euthymic mood, full affect  EKG:  EKG is ordered today. The ekg ordered today shows AV pacing   Device interrogation is reviewed today in detail.  See PaceArt for details.  Recent Labs: 09/18/2015: ALT 21 10/30/2015: BUN 10; Creatinine, Ser 1.05; Hemoglobin 13.8; Platelets 143*; Potassium 4.1; Sodium 141    Lipid Panel     Component Value Date/Time   CHOL 117 10/31/2015 0544   TRIG 54 10/31/2015 0544   HDL 43 10/31/2015 0544   CHOLHDL 2.7 10/31/2015 0544   VLDL 11 10/31/2015 0544   LDLCALC 63 10/31/2015 0544     Wt Readings from Last 3 Encounters:  02/16/16 182 lb (82.555 kg)  11/01/15 183 lb 14.4 oz (83.416 kg)  09/18/15 184 lb 6 oz (83.632 kg)      Other studies Reviewed: Additional studies/ records that were reviewed today include: none     ASSESSMENT AND PLAN: Complete Heart Block  Pacemaker-Medtronic  The patient's device was interrogated.  The information was reviewed. No changes were made in the programming.     Pain pacemaker site   syncope-neurally mediated   Stable functional status   Syncope almost certainly neurally mediated. We discussed the importance of sitting longer if he gets dizzy.   Current medicines are reviewed at length with the patient today.   The patient does not have concerns regarding his  medicines.  The following changes were made today:  none  Labs/ tests ordered today include:    No orders of the defined types were placed in this encounter.     Disposition:   FU with me in 1 year(s)  Signed, Virl Axe, MD  02/16/2016 9:46 AM     [address] [place] [address] [phone] (office) (  [phone] (fax)

## 2016-02-16 NOTE — Patient Instructions (Signed)
Medication Instructions: - Your physician recommends that you continue on your current medications as directed. Please refer to the Current Medication list given to you today.  Labwork: - none today  Procedures/Testing: - none  Follow-Up: - Remote monitoring is used to monitor your Pacemaker of ICD from home. This monitoring reduces the number of office visits required to check your device to one time per year. It allows [place] to keep an eye on the functioning of your device to ensure it is working properly. You are scheduled for a device check from home on 05/20/16. You may send your transmission at any time that day. If you have a wireless device, the transmission will be sent automatically. After your physician reviews your transmission, you will receive a postcard with your next transmission date.  - Your physician wants you to follow-up in: 1 year with Dr. [name] Comes. You will receive a reminder letter in the mail two months in advance. If you don't receive a letter, please call our office to schedule the follow-up appointment.  Any Additional Special Instructions Will Be Listed Below (If Applicable).     If you need a refill on your cardiac medications before your next appointment, please call your pharmacy.

## 2016-04-04 ENCOUNTER — Telehealth: Payer: Self-pay | Admitting: Cardiology

## 2016-04-04 NOTE — Telephone Encounter (Signed)
Pt is [place] [facility], [place] and he wants to know if his MD would recommend a cardiologist down there? Please call back at [phone] w/ any possible recommendations.

## 2016-04-08 NOTE — Telephone Encounter (Signed)
Reviewed with Dr. [name] Comes- he is not familiar with any cardiologists in the [facility]. I have notified the patient of this.

## 2016-04-11 DIAGNOSIS — Z23 Encounter for immunization: Secondary | ICD-10-CM | POA: Diagnosis not present

## 2016-05-02 DIAGNOSIS — E119 Type 2 diabetes mellitus without complications: Secondary | ICD-10-CM | POA: Diagnosis not present

## 2016-05-02 DIAGNOSIS — E784 Other hyperlipidemia: Secondary | ICD-10-CM | POA: Diagnosis not present

## 2016-05-02 DIAGNOSIS — E7800 Pure hypercholesterolemia, unspecified: Secondary | ICD-10-CM | POA: Diagnosis not present

## 2016-05-02 DIAGNOSIS — E1165 Type 2 diabetes mellitus with hyperglycemia: Secondary | ICD-10-CM | POA: Diagnosis not present

## 2016-05-03 DIAGNOSIS — Z0000 Encounter for general adult medical examination without abnormal findings: Secondary | ICD-10-CM | POA: Diagnosis not present

## 2016-05-03 DIAGNOSIS — E7800 Pure hypercholesterolemia, unspecified: Secondary | ICD-10-CM | POA: Diagnosis not present

## 2016-05-03 DIAGNOSIS — E119 Type 2 diabetes mellitus without complications: Secondary | ICD-10-CM | POA: Diagnosis not present

## 2016-05-03 DIAGNOSIS — I10 Essential (primary) hypertension: Secondary | ICD-10-CM | POA: Diagnosis not present

## 2016-05-16 ENCOUNTER — Emergency Department (HOSPITAL_COMMUNITY)
Admission: EM | Admit: 2016-05-16 | Discharge: 2016-05-16 | Disposition: A | Discharge status: Home / Self Care | Payer: Medicare Other | Attending: Emergency Medicine | Admitting: Emergency Medicine

## 2016-05-16 ENCOUNTER — Encounter (HOSPITAL_COMMUNITY): Payer: Self-pay | Admitting: Emergency Medicine

## 2016-05-16 DIAGNOSIS — I2510 Atherosclerotic heart disease of native coronary artery without angina pectoris: Secondary | ICD-10-CM | POA: Insufficient documentation

## 2016-05-16 DIAGNOSIS — Z8669 Personal history of other diseases of the nervous system and sense organs: Secondary | ICD-10-CM | POA: Diagnosis not present

## 2016-05-16 DIAGNOSIS — Z7982 Long term (current) use of aspirin: Secondary | ICD-10-CM | POA: Insufficient documentation

## 2016-05-16 DIAGNOSIS — M47812 Spondylosis without myelopathy or radiculopathy, cervical region: Secondary | ICD-10-CM | POA: Diagnosis not present

## 2016-05-16 DIAGNOSIS — Z950 Presence of cardiac pacemaker: Secondary | ICD-10-CM | POA: Diagnosis not present

## 2016-05-16 DIAGNOSIS — Z96642 Presence of left artificial hip joint: Secondary | ICD-10-CM | POA: Diagnosis not present

## 2016-05-16 DIAGNOSIS — Z7984 Long term (current) use of oral hypoglycemic drugs: Secondary | ICD-10-CM | POA: Insufficient documentation

## 2016-05-16 DIAGNOSIS — L255 Unspecified contact dermatitis due to plants, except food: Secondary | ICD-10-CM | POA: Insufficient documentation

## 2016-05-16 DIAGNOSIS — I10 Essential (primary) hypertension: Secondary | ICD-10-CM | POA: Diagnosis not present

## 2016-05-16 DIAGNOSIS — Z79891 Long term (current) use of opiate analgesic: Secondary | ICD-10-CM | POA: Diagnosis not present

## 2016-05-16 DIAGNOSIS — Z79899 Other long term (current) drug therapy: Secondary | ICD-10-CM | POA: Insufficient documentation

## 2016-05-16 DIAGNOSIS — G894 Chronic pain syndrome: Secondary | ICD-10-CM | POA: Diagnosis not present

## 2016-05-16 DIAGNOSIS — L299 Pruritus, unspecified: Secondary | ICD-10-CM | POA: Diagnosis present

## 2016-05-16 DIAGNOSIS — E785 Hyperlipidemia, unspecified: Secondary | ICD-10-CM | POA: Insufficient documentation

## 2016-05-16 DIAGNOSIS — M47816 Spondylosis without myelopathy or radiculopathy, lumbar region: Secondary | ICD-10-CM | POA: Diagnosis not present

## 2016-05-16 DIAGNOSIS — L237 Allergic contact dermatitis due to plants, except food: Secondary | ICD-10-CM | POA: Diagnosis not present

## 2016-05-16 MED ORDER — PREDNISONE 20 MG PO TABS
60.0000 mg | ORAL_TABLET | Freq: Once | ORAL | Status: AC
  Administered 2016-05-16: 60 mg via ORAL
  Filled 2016-05-16: qty 3

## 2016-05-16 MED ORDER — FAMOTIDINE 20 MG PO TABS: 20.0000 mg | ORAL_TABLET | Freq: Two times a day (BID) | ORAL | Status: DC

## 2016-05-16 MED ORDER — FAMOTIDINE 20 MG PO TABS
20.0000 mg | ORAL_TABLET | Freq: Once | ORAL | Status: AC
  Administered 2016-05-16: 20 mg via ORAL
  Filled 2016-05-16: qty 1

## 2016-05-16 MED ORDER — PREDNISONE 10 MG PO TABS: 20.0000 mg | ORAL_TABLET | Freq: Every day | ORAL | Status: DC

## 2016-05-16 NOTE — ED Provider Notes (Signed)
CSN: [id number]     Arrival date & time 05/16/16  1707 History  By signing my name below, I, [facility], attest that this documentation has been prepared under the direction and in the presence of Junius Creamer, NP. Electronically Signed: Virgel Bouquet, ED Scribe. 05/16/2016. 8:31 PM.    Chief Complaint  Patient presents with  . Pruritis    The history is provided by the patient. No language interpreter was used.   HPI Comments: LENNEX ROHM is a 81 y.o. male with a hx of CAD, HLN, HTN, meningioma, and ETOH abuse who presents to the Emergency Department complaining of constant, mild, generalized pruitis to his entire body onset 1 week ago. Pt states that he bought a house in [facility] and has been moving furniture followed by pruritis and the appearance of scattered bumps on his back that he noticed upon arrival to the ED today. He notes that he does have poison ivy in his backyard and occasionally walks outdoors near where this plant grows. Pruritis is worse with hot showers and hot weather and improved by nothing. He has scratched without relief. Denies recent changes in medications, soaps, lotions, detergents, or foods. Denies SOB, facial swelling, difficulty swallowing, or any other symptoms currently.  Past Medical History  Diagnosis Date  . CAD (coronary artery disease)     cath 01/12 no CAD, no records of reported silent MI in [facility], in [place], normal [place] 2007, EF [id number], mild diastolic dysfunction  . 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 [name] Comes  . Hyperlipidemia   . Hypertension   . GERD (gastroesophageal reflux disease)   . Meningioma ([place])     involves right optic nerve  . Chronic constipation     GI referral to Dr. Oletta Lamas in the past, pt did not go  . Idiopathic acute pancreatitis   . Syncopal episodes   . Renal insufficiency     creatinine ~1.2 in the past  . H.  pylori infection     P[employer] association  . History of alcohol abuse   . Glaucoma, open angle   . Cervical spondylarthritis   . Tinea cruris    Past Surgical History  Procedure Laterality Date  . Left hip replacement    . Cataract extraction    . Pacemaker insertion      Revo Sure Scan pacemaker   Family History  Problem Relation Age of Onset  . Diabetes Mother    Social History  Substance Use Topics  . Smoking status: Never Smoker   . Smokeless tobacco: None  . Alcohol Use: No     Comment: stopped 1974    Review of Systems  HENT: Negative for facial swelling and trouble swallowing.   Respiratory: Negative for shortness of breath.   Skin: Positive for rash (scattered bumps on back and right arm).  All other systems reviewed and are negative.     Allergies  Review of patient's allergies indicates no known allergies.  Home Medications   Prior to Admission medications   Medication Sig Start Date End Date Taking? Authorizing Provider  AMITIZA 24 MCG capsule Take 24 mcg by mouth 2 (two) times daily.  10/10/15  Yes Historical Provider, MD  aspirin 81 MG tablet Take 81 mg by mouth daily.    Yes Historical Provider, MD  CRANBERRY PO Take 2 tablets by mouth daily.   Yes Historical Provider, MD  darifenacin (ENABLEX) 15 MG 24 hr tablet Take 15 mg by mouth daily.   Yes Historical Provider, MD  Doxylamine Succinate, Sleep, (SLEEP AID PO) Take 2 tablets by mouth at bedtime.   Yes Historical Provider, MD  guaiFENesin (MUCINEX) 600 MG 12 hr tablet Take 1,200 mg by mouth at bedtime.   Yes Historical Provider, MD  metFORMIN (GLUCOPHAGE-XR) 500 MG 24 hr tablet Take 500 mg by mouth 2 (two) times daily.    Yes Historical Provider, MD  Nutritional Supplements (BLADDER 2.2 PO) Take 2 tablets by mouth at bedtime.   Yes Historical Provider, MD  omeprazole (PRILOSEC) 40 MG capsule Take 40 mg by mouth daily.    Yes Historical Provider, MD  oxyCODONE (ROXICODONE) 15 MG immediate release tablet  Take 15 mg by mouth 4 (four) times daily. Take every day per patient 05/04/15  Yes Historical Provider, MD  RAPAFLO 8 MG CAPS capsule take 1 capsule by mouth once daily WITH EVENING MEAL 05/09/16  Yes Historical Provider, MD  rosuvastatin (CRESTOR) 20 MG tablet Take 10 mg by mouth daily.    Yes Historical Provider, MD  Specialty Vitamins Products (PROSTATE PO) Take 1 tablet by mouth 2 (two) times daily.   Yes Historical Provider, MD  tamsulosin (FLOMAX) 0.4 MG CAPS capsule Take 0.4 mg by mouth daily.  12/19/14  Yes Historical Provider, MD  vitamin C (ASCORBIC ACID) 500 MG tablet Take 500 mg by mouth daily.   Yes Historical Provider, MD  clonazePAM (KLONOPIN) 0.5 MG tablet Take 1 mg by mouth 2 (two) times daily as needed for anxiety. For anxiety    Historical Provider, MD  famotidine (PEPCID) 20 MG tablet Take 1 tablet (20 mg total) by mouth 2 (two) times daily. 05/16/16   Junius Creamer, NP  MYRBETRIQ 50 MG TB24 tablet Take 50 mg by mouth daily. Reported on 05/16/2016 08/31/15   Historical Provider, MD  predniSONE (DELTASONE) 10 MG tablet Take 2 tablets (20 mg total) by mouth daily. 05/16/16   Junius Creamer, NP   BP 152/74 mmHg  Pulse 54  Temp(Src) 98 F (36.7 C) (Oral)  Resp 18  Ht 5\' 5"  (1.651 m)  Wt 77.565 kg  BMI 28.46 kg/m2  SpO2 99% Physical Exam  Constitutional: He is oriented to person, place, and time. He appears well-developed and well-nourished. No distress.  HENT:  Head: Normocephalic and atraumatic.  Eyes: Conjunctivae are normal.  Neck: Normal range of motion.  Cardiovascular: Normal rate.   Pulmonary/Chest: Effort normal. No respiratory distress.  Musculoskeletal: Normal range of motion.  Neurological: He is alert and oriented to person, place, and time.  Skin: Skin is warm and dry. Lesion noted.  Scattered lesions on the back, right axilla, and along the right side and down onto right abdomen.   Psychiatric: He has a normal mood and affect. His behavior is normal.  Nursing note  and vitals reviewed.   ED Course  Procedures   DIAGNOSTIC STUDIES: Oxygen Saturation is 99% on RA, normal by my interpretation.    COORDINATION OF CARE: 8:20 PM Will order and prescribe Pepcid and prednisone. Will discharge pt. Discussed treatment plan with pt at bedside and pt agreed to plan.   MDM   Final diagnoses:  Contact dermatitis due to poison ivy    I personally performed the services described in this documentation, which was scribed in my presence. The recorded information has been reviewed and is accurate.   Junius Creamer, NP 05/16/16 2038  Daleen Bo, MD 05/17/16 1230

## 2016-05-16 NOTE — ED Notes (Signed)
Pt reports generalized itchy feeling for the past week. No sob, throat swelling or rash. Has not taken any new medications or used a new body product.

## 2016-05-16 NOTE — Discharge Instructions (Signed)

## 2016-05-20 ENCOUNTER — Telehealth: Payer: Self-pay | Admitting: Cardiology

## 2016-05-20 ENCOUNTER — Encounter: Payer: Medicare Other | Admitting: *Deleted

## 2016-05-20 NOTE — Telephone Encounter (Signed)
LMOVM reminding pt to send remote transmission.   

## 2016-05-24 ENCOUNTER — Ambulatory Visit (INDEPENDENT_AMBULATORY_CARE_PROVIDER_SITE_OTHER): Payer: Medicare Other | Admitting: *Deleted

## 2016-05-24 DIAGNOSIS — I442 Atrioventricular block, complete: Secondary | ICD-10-CM

## 2016-05-27 ENCOUNTER — Encounter: Payer: Self-pay | Admitting: Cardiology

## 2016-05-27 NOTE — Progress Notes (Signed)
Remote pacemaker transmission.   

## 2016-06-04 LAB — CUP PACEART REMOTE DEVICE CHECK
Brady Statistic AP VP Percent: 53.12 %
Brady Statistic AP VS Percent: 0.01 %
Brady Statistic AS VS Percent: 0.03 %
Date Time Interrogation Session: 20170721155528
Implantable Lead Location: 753859
Implantable Lead Location: 753860
Implantable Lead Model: 5086
Lead Channel Impedance Value: 432 Ohm
Lead Channel Sensing Intrinsic Amplitude: 2.572 mV
Lead Channel Setting Pacing Amplitude: 2.5 V
MDC IDC LEAD IMPLANT DT: 20120130
MDC IDC LEAD IMPLANT DT: 20120130
MDC IDC MSMT BATTERY VOLTAGE: 2.96 V
MDC IDC MSMT LEADCHNL RA IMPEDANCE VALUE: 680 Ohm
MDC IDC SET LEADCHNL RA PACING AMPLITUDE: 2 V
MDC IDC SET LEADCHNL RV PACING PULSEWIDTH: 0.4 ms
MDC IDC SET LEADCHNL RV SENSING SENSITIVITY: 2.1 mV
MDC IDC STAT BRADY AS VP PERCENT: 46.84 %
MDC IDC STAT BRADY RA PERCENT PACED: 53.13 %
MDC IDC STAT BRADY RV PERCENT PACED: 99.96 %

## 2016-06-06 DIAGNOSIS — Z23 Encounter for immunization: Secondary | ICD-10-CM | POA: Diagnosis not present

## 2016-06-20 IMAGING — MR MR HEAD W/O CM
9 of 10 series · 36 of 48 positions shown · non-contrast
Comparison: Head and cervical spine CT 10/30/2015 and earlier.

CLINICAL DATA: 80-year-old male with acute onset right side
weakness, numbness, syncope, loss of consciousness last night.
Initial encounter. [REDACTED] conditional pacemaker for which MRI
guidelines for scanning were followed. Initial encounter.

EXAM:
MRI HEAD WITHOUT CONTRAST
TECHNIQUE: Multiplanar, multiecho pulse sequences of the brain and surrounding
structures were obtained without intravenous contrast.

[Series 2: T1 · sagittal · 5.0mm · 0.47mm/px · 2 of 25 slices shown]
[im 1/25]
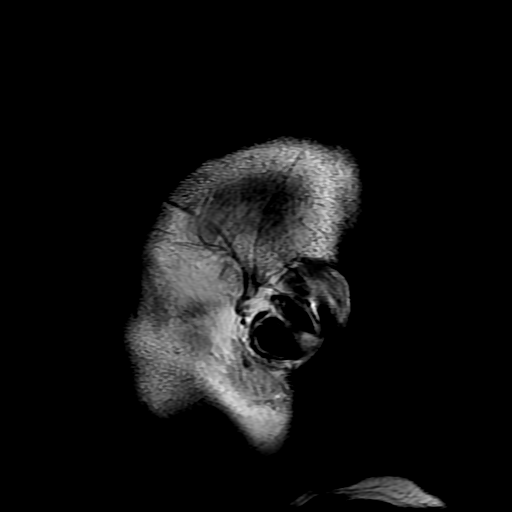
[im 25/25]
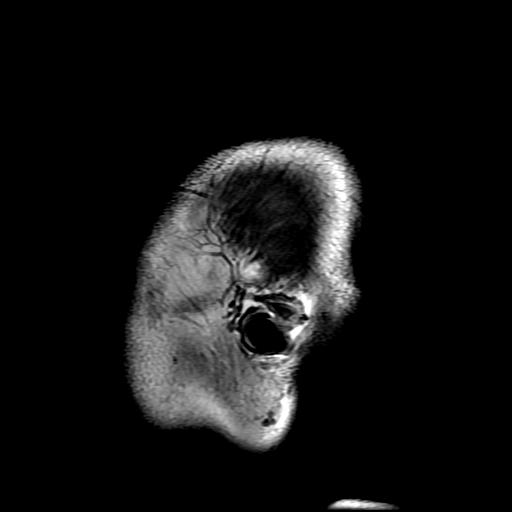

[Series 3: FLAIR · axial · 5.0mm · 0.45mm/px · z∈[-68,+71]mm · 2 of 21 slices shown]
[im 1/21]
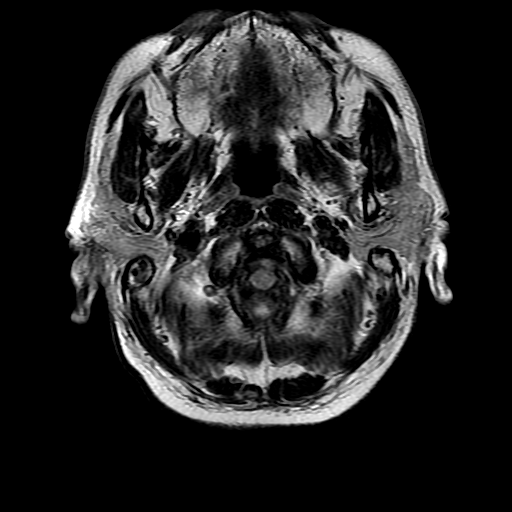
[im 21/21]
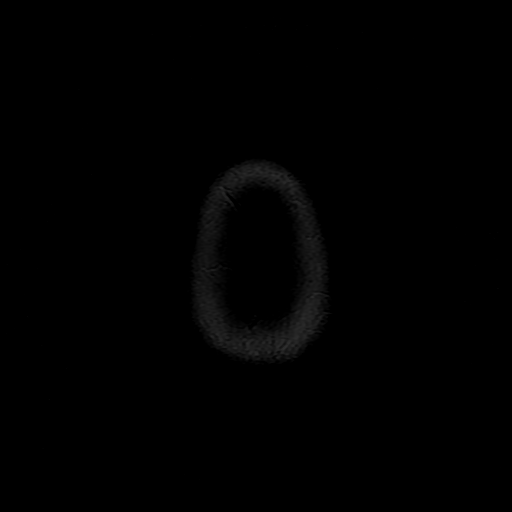

[Series 4: DWI · axial · 3.0mm · 1.09mm/px · z∈[-60,+63]mm · 9 of 84 slices shown (1 of 4)]
[im 1/84]
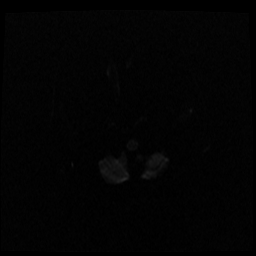
[im 11/84]
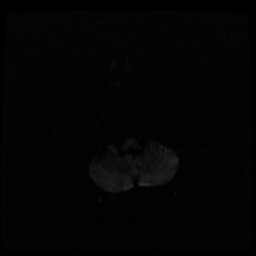
[im 21/84]
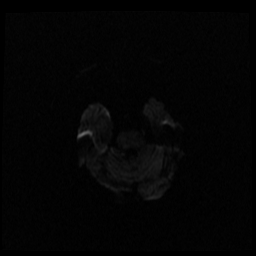
[im 32/84]
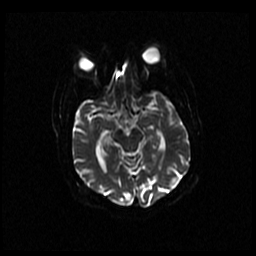
[im 42/84]
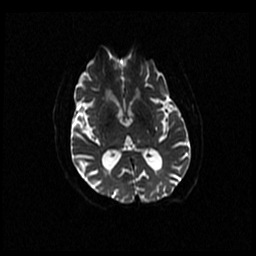
[im 52/84]
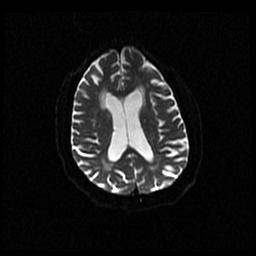
[im 63/84]
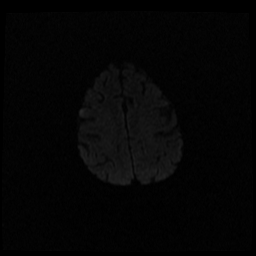
[im 73/84]
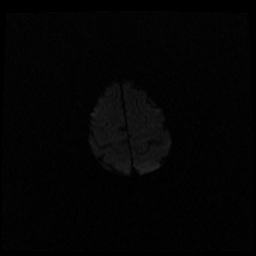
[im 84/84]
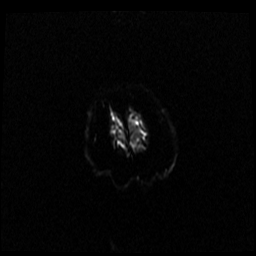

[Series 5: DWI · coronal · 5.0mm · 1.09mm/px · 7 of 68 slices shown (2 of 4)]
[im 1/68]
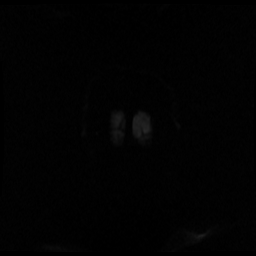
[im 12/68]
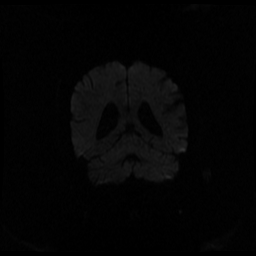
[im 23/68]
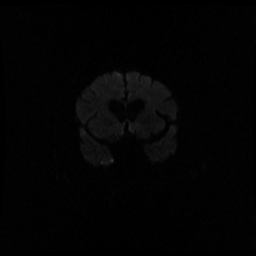
[im 34/68]
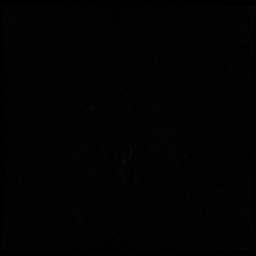
[im 45/68]
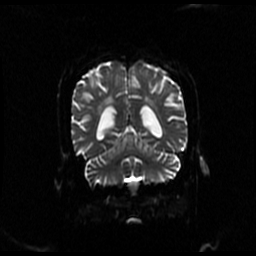
[im 56/68]
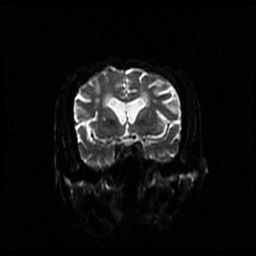
[im 68/68]
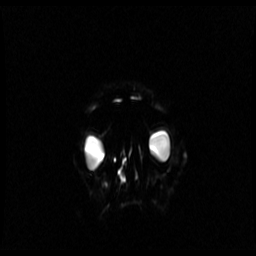

[Series 6: T2 · axial · 5.0mm · 0.45mm/px · z∈[-69,+69]mm · 3 of 24 slices shown (1 of 2)]
[im 1/24]
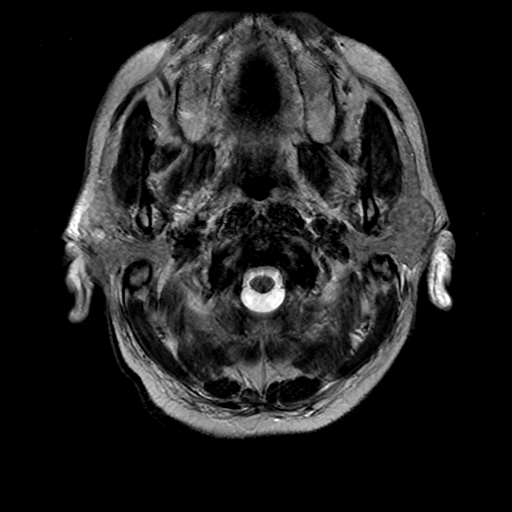
[im 12/24]
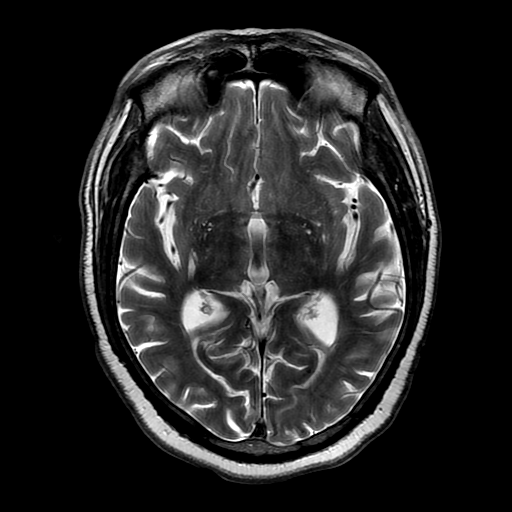
[im 24/24]
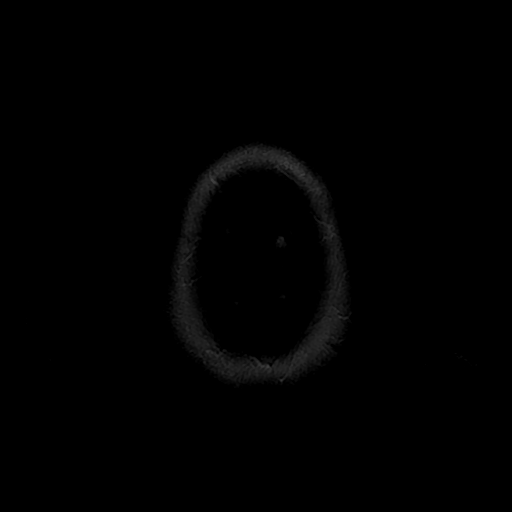

[Series 7: ax mpgr · axial · 5.0mm · 0.47mm/px · 1 of 21 slices shown]
[im 1/21]
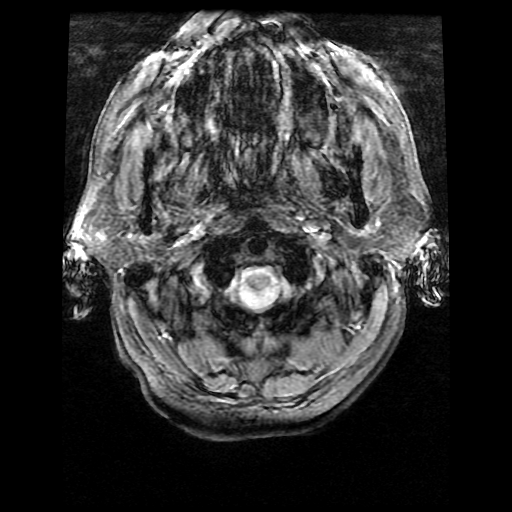

[Series 9: T2 · coronal · 5.0mm · 0.39mm/px · 3 of 25 slices shown (2 of 2)]
[im 1/25]
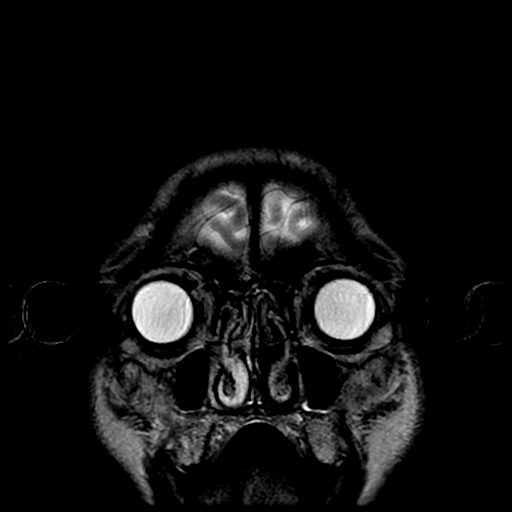
[im 13/25]
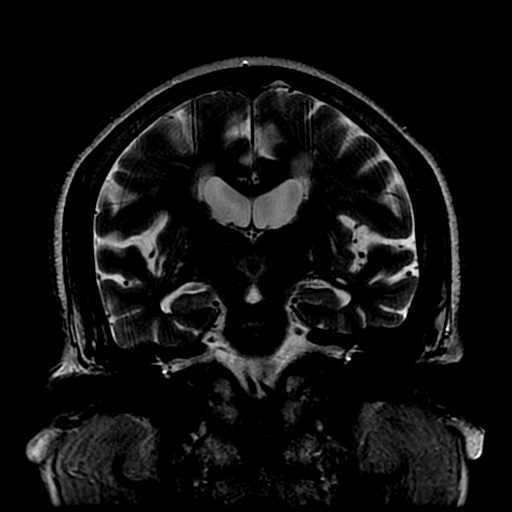
[im 25/25]
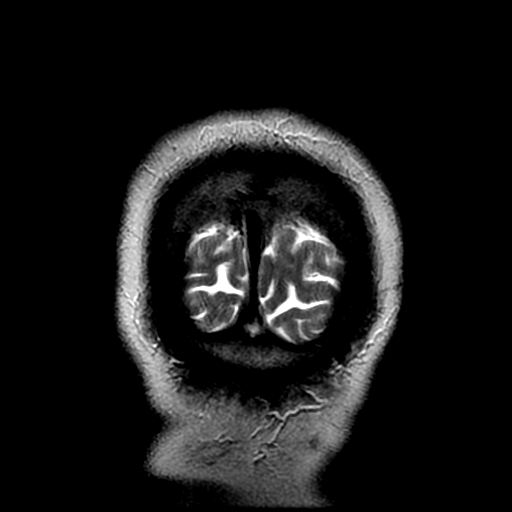

[Series 400: DWI · axial · 3.0mm · 1.09mm/px · z∈[-60,+63]mm · 5 of 42 slices shown (3 of 4)]
[im 1/42]
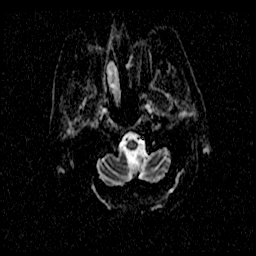
[im 11/42]
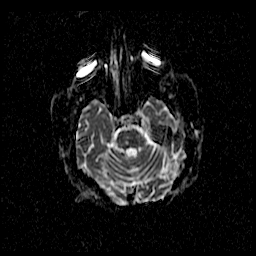
[im 21/42]
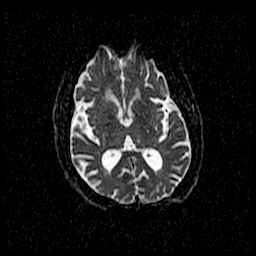
[im 31/42]
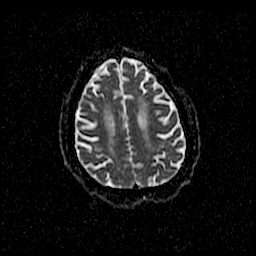
[im 42/42]
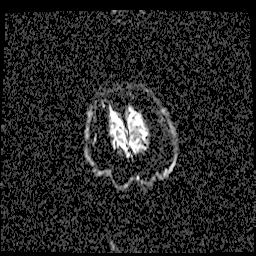

[Series 500: DWI · coronal · 5.0mm · 1.09mm/px · 4 of 34 slices shown (4 of 4)]
[im 1/34]
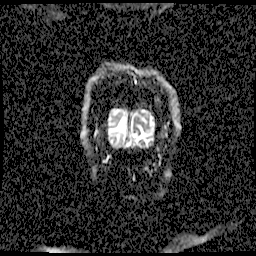
[im 12/34]
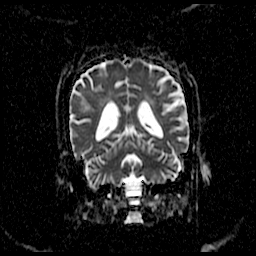
[im 23/34]
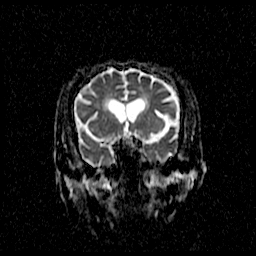
[im 34/34]
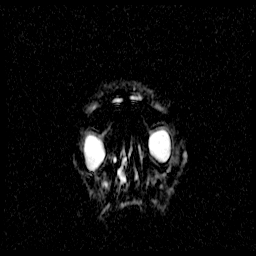

[36 of 48 positions shown; findings below may reference images not displayed]

FINDINGS: No restricted diffusion to suggest acute infarction. No midline
shift, mass effect, evidence of mass lesion, ventriculomegaly,
extra-axial collection or acute intracranial hemorrhage.
Cervicomedullary junction and pituitary are within normal limits.
Major intracranial vascular flow voids are preserved. Negative
visualized cervical spine.

Chronic lacunar infarcts in the right paracentral pons. Small
chronic micro hemorrhage in the left occipital lobe (series 7, image
8). Mild to moderate T2 heterogeneity in the deep gray matter nuclei
suspected due to small chronic lacunar infarcts. Patchy bilateral
white matter T2 and FLAIR hyperintensity. No cortical
encephalomalacia.

Visible internal auditory structures appear normal. Mastoids are
clear. Mild paranasal sinus mucosal thickening. Negative orbit and
scalp soft tissues, postoperative changes to the globes. Normal bone
marrow signal.
IMPRESSION: 1.  No acute intracranial abnormality.
2. Moderate for age signal changes compatible with chronic small
vessel disease, most pronounced in the bilateral cerebral white
matter and right pons.

## 2016-08-26 ENCOUNTER — Ambulatory Visit (INDEPENDENT_AMBULATORY_CARE_PROVIDER_SITE_OTHER): Payer: Medicare Other | Admitting: *Deleted

## 2016-08-26 DIAGNOSIS — I442 Atrioventricular block, complete: Secondary | ICD-10-CM

## 2016-08-26 NOTE — Progress Notes (Signed)
Remote pacemaker transmission.   

## 2016-08-27 ENCOUNTER — Encounter: Payer: Self-pay | Admitting: Cardiology

## 2016-08-28 ENCOUNTER — Telehealth: Payer: Self-pay | Admitting: Internal Medicine

## 2016-08-28 NOTE — Telephone Encounter (Signed)
Informed patient that remote was received. Patient voiced understanding. 

## 2016-08-28 NOTE — Telephone Encounter (Signed)
New message  Pt call requesting to speak with RN to see if transmission was received. Please call back to discuss

## 2016-09-02 DIAGNOSIS — N400 Enlarged prostate without lower urinary tract symptoms: Secondary | ICD-10-CM | POA: Diagnosis not present

## 2016-09-02 DIAGNOSIS — I442 Atrioventricular block, complete: Secondary | ICD-10-CM | POA: Diagnosis not present

## 2016-09-02 DIAGNOSIS — N289 Disorder of kidney and ureter, unspecified: Secondary | ICD-10-CM | POA: Diagnosis not present

## 2016-09-02 DIAGNOSIS — E119 Type 2 diabetes mellitus without complications: Secondary | ICD-10-CM | POA: Diagnosis not present

## 2016-09-02 DIAGNOSIS — K210 Gastro-esophageal reflux disease with esophagitis: Secondary | ICD-10-CM | POA: Diagnosis not present

## 2016-09-02 DIAGNOSIS — I25119 Atherosclerotic heart disease of native coronary artery with unspecified angina pectoris: Secondary | ICD-10-CM | POA: Diagnosis not present

## 2016-09-02 DIAGNOSIS — E039 Hypothyroidism, unspecified: Secondary | ICD-10-CM | POA: Diagnosis not present

## 2016-09-02 DIAGNOSIS — E785 Hyperlipidemia, unspecified: Secondary | ICD-10-CM | POA: Diagnosis not present

## 2016-09-02 DIAGNOSIS — I639 Cerebral infarction, unspecified: Secondary | ICD-10-CM | POA: Diagnosis not present

## 2016-09-02 DIAGNOSIS — R972 Elevated prostate specific antigen [PSA]: Secondary | ICD-10-CM | POA: Diagnosis not present

## 2016-09-02 DIAGNOSIS — E538 Deficiency of other specified B group vitamins: Secondary | ICD-10-CM | POA: Diagnosis not present

## 2016-09-16 DIAGNOSIS — N400 Enlarged prostate without lower urinary tract symptoms: Secondary | ICD-10-CM | POA: Diagnosis not present

## 2016-09-16 DIAGNOSIS — N289 Disorder of kidney and ureter, unspecified: Secondary | ICD-10-CM | POA: Diagnosis not present

## 2016-09-16 DIAGNOSIS — N3090 Cystitis, unspecified without hematuria: Secondary | ICD-10-CM | POA: Diagnosis not present

## 2016-09-16 DIAGNOSIS — E119 Type 2 diabetes mellitus without complications: Secondary | ICD-10-CM | POA: Diagnosis not present

## 2016-09-16 DIAGNOSIS — R351 Nocturia: Secondary | ICD-10-CM | POA: Diagnosis not present

## 2016-09-16 DIAGNOSIS — I25119 Atherosclerotic heart disease of native coronary artery with unspecified angina pectoris: Secondary | ICD-10-CM | POA: Diagnosis not present

## 2016-09-16 DIAGNOSIS — I442 Atrioventricular block, complete: Secondary | ICD-10-CM | POA: Diagnosis not present

## 2016-09-16 DIAGNOSIS — E785 Hyperlipidemia, unspecified: Secondary | ICD-10-CM | POA: Diagnosis not present

## 2016-09-16 DIAGNOSIS — I639 Cerebral infarction, unspecified: Secondary | ICD-10-CM | POA: Diagnosis not present

## 2016-09-19 DIAGNOSIS — N401 Enlarged prostate with lower urinary tract symptoms: Secondary | ICD-10-CM | POA: Diagnosis not present

## 2016-09-19 LAB — CUP PACEART REMOTE DEVICE CHECK
Battery Voltage: 2.96 V
Brady Statistic AP VP Percent: 55.91 %
Brady Statistic AS VP Percent: 44.05 %
Brady Statistic RA Percent Paced: 55.92 %
Brady Statistic RV Percent Paced: 99.96 %
Date Time Interrogation Session: 20171023100828
Implantable Lead Implant Date: 20120130
Implantable Lead Location: 753859
Implantable Pulse Generator Implant Date: 20120130
Lead Channel Setting Pacing Amplitude: 2 V
Lead Channel Setting Pacing Pulse Width: 0.4 ms
Lead Channel Setting Sensing Sensitivity: 2.1 mV
MDC IDC LEAD IMPLANT DT: 20120130
MDC IDC LEAD LOCATION: 753860
MDC IDC LEAD MODEL: 5086
MDC IDC MSMT LEADCHNL RA IMPEDANCE VALUE: 632 Ohm
MDC IDC MSMT LEADCHNL RA SENSING INTR AMPL: 2.705 mV
MDC IDC MSMT LEADCHNL RV IMPEDANCE VALUE: 416 Ohm
MDC IDC SET LEADCHNL RV PACING AMPLITUDE: 2.5 V
MDC IDC STAT BRADY AP VS PERCENT: 0.01 %
MDC IDC STAT BRADY AS VS PERCENT: 0.03 %

## 2016-10-02 DIAGNOSIS — M5481 Occipital neuralgia: Secondary | ICD-10-CM | POA: Diagnosis not present

## 2016-10-02 DIAGNOSIS — Z01818 Encounter for other preprocedural examination: Secondary | ICD-10-CM | POA: Diagnosis not present

## 2016-10-02 DIAGNOSIS — Z7984 Long term (current) use of oral hypoglycemic drugs: Secondary | ICD-10-CM | POA: Diagnosis not present

## 2016-10-02 DIAGNOSIS — Z6835 Body mass index (BMI) 35.0-35.9, adult: Secondary | ICD-10-CM | POA: Diagnosis not present

## 2016-10-02 DIAGNOSIS — R319 Hematuria, unspecified: Secondary | ICD-10-CM | POA: Diagnosis not present

## 2016-10-02 DIAGNOSIS — Z79899 Other long term (current) drug therapy: Secondary | ICD-10-CM | POA: Diagnosis not present

## 2016-10-02 DIAGNOSIS — M4722 Other spondylosis with radiculopathy, cervical region: Secondary | ICD-10-CM | POA: Diagnosis not present

## 2016-10-02 DIAGNOSIS — Z950 Presence of cardiac pacemaker: Secondary | ICD-10-CM | POA: Diagnosis not present

## 2016-10-02 DIAGNOSIS — E669 Obesity, unspecified: Secondary | ICD-10-CM | POA: Diagnosis not present

## 2016-10-02 DIAGNOSIS — N320 Bladder-neck obstruction: Secondary | ICD-10-CM | POA: Diagnosis not present

## 2016-10-02 DIAGNOSIS — E119 Type 2 diabetes mellitus without complications: Secondary | ICD-10-CM | POA: Diagnosis not present

## 2016-10-02 DIAGNOSIS — M791 Myalgia: Secondary | ICD-10-CM | POA: Diagnosis not present

## 2016-10-02 DIAGNOSIS — Z7982 Long term (current) use of aspirin: Secondary | ICD-10-CM | POA: Diagnosis not present

## 2016-10-02 DIAGNOSIS — M5010 Cervical disc disorder with radiculopathy, unspecified cervical region: Secondary | ICD-10-CM | POA: Diagnosis not present

## 2016-10-03 DIAGNOSIS — N3090 Cystitis, unspecified without hematuria: Secondary | ICD-10-CM | POA: Diagnosis not present

## 2016-10-03 DIAGNOSIS — N320 Bladder-neck obstruction: Secondary | ICD-10-CM | POA: Diagnosis not present

## 2016-10-03 DIAGNOSIS — E119 Type 2 diabetes mellitus without complications: Secondary | ICD-10-CM | POA: Diagnosis not present

## 2016-10-03 DIAGNOSIS — Z6835 Body mass index (BMI) 35.0-35.9, adult: Secondary | ICD-10-CM | POA: Diagnosis not present

## 2016-10-03 DIAGNOSIS — E669 Obesity, unspecified: Secondary | ICD-10-CM | POA: Diagnosis not present

## 2016-10-03 DIAGNOSIS — Z7984 Long term (current) use of oral hypoglycemic drugs: Secondary | ICD-10-CM | POA: Diagnosis not present

## 2016-10-03 DIAGNOSIS — R319 Hematuria, unspecified: Secondary | ICD-10-CM | POA: Diagnosis not present

## 2016-10-08 DIAGNOSIS — N401 Enlarged prostate with lower urinary tract symptoms: Secondary | ICD-10-CM | POA: Diagnosis not present

## 2016-10-09 DIAGNOSIS — M5481 Occipital neuralgia: Secondary | ICD-10-CM | POA: Diagnosis not present

## 2016-10-09 DIAGNOSIS — M47812 Spondylosis without myelopathy or radiculopathy, cervical region: Secondary | ICD-10-CM | POA: Diagnosis not present

## 2016-10-09 DIAGNOSIS — M791 Myalgia: Secondary | ICD-10-CM | POA: Diagnosis not present

## 2016-10-09 DIAGNOSIS — Z79891 Long term (current) use of opiate analgesic: Secondary | ICD-10-CM | POA: Diagnosis not present

## 2016-10-09 DIAGNOSIS — M5010 Cervical disc disorder with radiculopathy, unspecified cervical region: Secondary | ICD-10-CM | POA: Diagnosis not present

## 2016-10-14 DIAGNOSIS — N400 Enlarged prostate without lower urinary tract symptoms: Secondary | ICD-10-CM | POA: Diagnosis not present

## 2016-10-14 DIAGNOSIS — I25119 Atherosclerotic heart disease of native coronary artery with unspecified angina pectoris: Secondary | ICD-10-CM | POA: Diagnosis not present

## 2016-10-14 DIAGNOSIS — I639 Cerebral infarction, unspecified: Secondary | ICD-10-CM | POA: Diagnosis not present

## 2016-10-14 DIAGNOSIS — E785 Hyperlipidemia, unspecified: Secondary | ICD-10-CM | POA: Diagnosis not present

## 2016-10-14 DIAGNOSIS — R351 Nocturia: Secondary | ICD-10-CM | POA: Diagnosis not present

## 2016-10-14 DIAGNOSIS — E119 Type 2 diabetes mellitus without complications: Secondary | ICD-10-CM | POA: Diagnosis not present

## 2016-10-14 DIAGNOSIS — Z23 Encounter for immunization: Secondary | ICD-10-CM | POA: Diagnosis not present

## 2016-10-14 DIAGNOSIS — I442 Atrioventricular block, complete: Secondary | ICD-10-CM | POA: Diagnosis not present

## 2016-10-14 DIAGNOSIS — R197 Diarrhea, unspecified: Secondary | ICD-10-CM | POA: Diagnosis not present

## 2016-11-06 DIAGNOSIS — M5010 Cervical disc disorder with radiculopathy, unspecified cervical region: Secondary | ICD-10-CM | POA: Diagnosis not present

## 2016-11-06 DIAGNOSIS — M791 Myalgia: Secondary | ICD-10-CM | POA: Diagnosis not present

## 2016-11-06 DIAGNOSIS — M5481 Occipital neuralgia: Secondary | ICD-10-CM | POA: Diagnosis not present

## 2016-11-06 DIAGNOSIS — M4722 Other spondylosis with radiculopathy, cervical region: Secondary | ICD-10-CM | POA: Diagnosis not present

## 2016-11-08 DIAGNOSIS — D329 Benign neoplasm of meninges, unspecified: Secondary | ICD-10-CM | POA: Diagnosis not present

## 2016-11-08 DIAGNOSIS — R51 Headache: Secondary | ICD-10-CM | POA: Diagnosis not present

## 2016-11-08 DIAGNOSIS — K210 Gastro-esophageal reflux disease with esophagitis: Secondary | ICD-10-CM | POA: Diagnosis not present

## 2016-11-08 DIAGNOSIS — R351 Nocturia: Secondary | ICD-10-CM | POA: Diagnosis not present

## 2016-11-08 DIAGNOSIS — N400 Enlarged prostate without lower urinary tract symptoms: Secondary | ICD-10-CM | POA: Diagnosis not present

## 2016-11-08 DIAGNOSIS — E119 Type 2 diabetes mellitus without complications: Secondary | ICD-10-CM | POA: Diagnosis not present

## 2016-11-08 DIAGNOSIS — I25119 Atherosclerotic heart disease of native coronary artery with unspecified angina pectoris: Secondary | ICD-10-CM | POA: Diagnosis not present

## 2016-11-08 DIAGNOSIS — I639 Cerebral infarction, unspecified: Secondary | ICD-10-CM | POA: Diagnosis not present

## 2016-11-08 DIAGNOSIS — I442 Atrioventricular block, complete: Secondary | ICD-10-CM | POA: Diagnosis not present

## 2016-11-08 DIAGNOSIS — E785 Hyperlipidemia, unspecified: Secondary | ICD-10-CM | POA: Diagnosis not present

## 2016-11-08 DIAGNOSIS — N289 Disorder of kidney and ureter, unspecified: Secondary | ICD-10-CM | POA: Diagnosis not present

## 2016-11-08 DIAGNOSIS — R197 Diarrhea, unspecified: Secondary | ICD-10-CM | POA: Diagnosis not present

## 2016-11-14 DIAGNOSIS — N471 Phimosis: Secondary | ICD-10-CM | POA: Diagnosis not present

## 2016-11-22 DIAGNOSIS — E785 Hyperlipidemia, unspecified: Secondary | ICD-10-CM | POA: Diagnosis not present

## 2016-11-22 DIAGNOSIS — I25119 Atherosclerotic heart disease of native coronary artery with unspecified angina pectoris: Secondary | ICD-10-CM | POA: Diagnosis not present

## 2016-11-22 DIAGNOSIS — I442 Atrioventricular block, complete: Secondary | ICD-10-CM | POA: Diagnosis not present

## 2016-11-22 DIAGNOSIS — N400 Enlarged prostate without lower urinary tract symptoms: Secondary | ICD-10-CM | POA: Diagnosis not present

## 2016-11-22 DIAGNOSIS — I639 Cerebral infarction, unspecified: Secondary | ICD-10-CM | POA: Diagnosis not present

## 2016-11-22 DIAGNOSIS — G44209 Tension-type headache, unspecified, not intractable: Secondary | ICD-10-CM | POA: Diagnosis not present

## 2016-11-22 DIAGNOSIS — D329 Benign neoplasm of meninges, unspecified: Secondary | ICD-10-CM | POA: Diagnosis not present

## 2016-11-22 DIAGNOSIS — N289 Disorder of kidney and ureter, unspecified: Secondary | ICD-10-CM | POA: Diagnosis not present

## 2016-11-22 DIAGNOSIS — K210 Gastro-esophageal reflux disease with esophagitis: Secondary | ICD-10-CM | POA: Diagnosis not present

## 2016-11-22 DIAGNOSIS — E119 Type 2 diabetes mellitus without complications: Secondary | ICD-10-CM | POA: Diagnosis not present

## 2016-11-22 DIAGNOSIS — R197 Diarrhea, unspecified: Secondary | ICD-10-CM | POA: Diagnosis not present

## 2016-11-22 DIAGNOSIS — R351 Nocturia: Secondary | ICD-10-CM | POA: Diagnosis not present

## 2016-11-25 ENCOUNTER — Telehealth: Payer: Self-pay | Admitting: Cardiology

## 2016-11-25 ENCOUNTER — Ambulatory Visit (INDEPENDENT_AMBULATORY_CARE_PROVIDER_SITE_OTHER): Payer: Medicare Other | Admitting: *Deleted

## 2016-11-25 DIAGNOSIS — I442 Atrioventricular block, complete: Secondary | ICD-10-CM

## 2016-11-25 NOTE — Telephone Encounter (Signed)
Spoke with pt and reminded pt of remote transmission that is due today. Pt verbalized understanding.   

## 2016-11-27 ENCOUNTER — Encounter: Payer: Self-pay | Admitting: Cardiology

## 2016-11-27 LAB — CUP PACEART REMOTE DEVICE CHECK
Battery Voltage: 2.96 V
Brady Statistic AP VP Percent: 45.42 %
Brady Statistic AS VP Percent: 54.56 %
Brady Statistic RA Percent Paced: 45.41 %
Brady Statistic RV Percent Paced: 99.96 %
Date Time Interrogation Session: 20180122203517
Implantable Lead Implant Date: 20120130
Implantable Lead Location: 753859
Implantable Lead Location: 753860
Implantable Lead Model: 5076
Implantable Pulse Generator Implant Date: 20120130
Lead Channel Setting Pacing Amplitude: 2 V
Lead Channel Setting Pacing Pulse Width: 0.4 ms
Lead Channel Setting Sensing Sensitivity: 2.1 mV
MDC IDC LEAD IMPLANT DT: 20120130
MDC IDC MSMT LEADCHNL RA IMPEDANCE VALUE: 656 Ohm
MDC IDC MSMT LEADCHNL RA SENSING INTR AMPL: 2.262 mV
MDC IDC MSMT LEADCHNL RV IMPEDANCE VALUE: 408 Ohm
MDC IDC SET LEADCHNL RV PACING AMPLITUDE: 2.5 V
MDC IDC STAT BRADY AP VS PERCENT: 0 %
MDC IDC STAT BRADY AS VS PERCENT: 0.02 %

## 2016-11-27 NOTE — Progress Notes (Signed)
Remote pacemaker transmission.   

## 2016-12-04 DIAGNOSIS — M791 Myalgia: Secondary | ICD-10-CM | POA: Diagnosis not present

## 2016-12-04 DIAGNOSIS — M5481 Occipital neuralgia: Secondary | ICD-10-CM | POA: Diagnosis not present

## 2016-12-04 DIAGNOSIS — M4722 Other spondylosis with radiculopathy, cervical region: Secondary | ICD-10-CM | POA: Diagnosis not present

## 2016-12-04 DIAGNOSIS — M1612 Unilateral primary osteoarthritis, left hip: Secondary | ICD-10-CM | POA: Diagnosis not present

## 2016-12-04 DIAGNOSIS — M5010 Cervical disc disorder with radiculopathy, unspecified cervical region: Secondary | ICD-10-CM | POA: Diagnosis not present

## 2016-12-13 DIAGNOSIS — E119 Type 2 diabetes mellitus without complications: Secondary | ICD-10-CM | POA: Diagnosis not present

## 2016-12-13 DIAGNOSIS — N471 Phimosis: Secondary | ICD-10-CM | POA: Diagnosis not present

## 2016-12-13 DIAGNOSIS — N481 Balanitis: Secondary | ICD-10-CM | POA: Diagnosis not present

## 2016-12-13 DIAGNOSIS — Z01818 Encounter for other preprocedural examination: Secondary | ICD-10-CM | POA: Diagnosis not present

## 2016-12-13 DIAGNOSIS — Z79899 Other long term (current) drug therapy: Secondary | ICD-10-CM | POA: Diagnosis not present

## 2016-12-13 DIAGNOSIS — N475 Adhesions of prepuce and glans penis: Secondary | ICD-10-CM | POA: Diagnosis not present

## 2016-12-13 DIAGNOSIS — Z7982 Long term (current) use of aspirin: Secondary | ICD-10-CM | POA: Diagnosis not present

## 2016-12-16 DIAGNOSIS — G894 Chronic pain syndrome: Secondary | ICD-10-CM | POA: Diagnosis not present

## 2016-12-16 DIAGNOSIS — M791 Myalgia: Secondary | ICD-10-CM | POA: Diagnosis not present

## 2016-12-16 DIAGNOSIS — M1612 Unilateral primary osteoarthritis, left hip: Secondary | ICD-10-CM | POA: Diagnosis not present

## 2016-12-16 DIAGNOSIS — M5481 Occipital neuralgia: Secondary | ICD-10-CM | POA: Diagnosis not present

## 2016-12-16 DIAGNOSIS — M797 Fibromyalgia: Secondary | ICD-10-CM | POA: Diagnosis not present

## 2016-12-16 DIAGNOSIS — M4722 Other spondylosis with radiculopathy, cervical region: Secondary | ICD-10-CM | POA: Diagnosis not present

## 2016-12-16 DIAGNOSIS — M5010 Cervical disc disorder with radiculopathy, unspecified cervical region: Secondary | ICD-10-CM | POA: Diagnosis not present

## 2016-12-16 DIAGNOSIS — M4802 Spinal stenosis, cervical region: Secondary | ICD-10-CM | POA: Diagnosis not present

## 2016-12-19 DIAGNOSIS — N475 Adhesions of prepuce and glans penis: Secondary | ICD-10-CM | POA: Diagnosis not present

## 2016-12-19 DIAGNOSIS — Z79899 Other long term (current) drug therapy: Secondary | ICD-10-CM | POA: Diagnosis not present

## 2016-12-19 DIAGNOSIS — N481 Balanitis: Secondary | ICD-10-CM | POA: Diagnosis not present

## 2016-12-19 DIAGNOSIS — Z7982 Long term (current) use of aspirin: Secondary | ICD-10-CM | POA: Diagnosis not present

## 2016-12-19 DIAGNOSIS — E119 Type 2 diabetes mellitus without complications: Secondary | ICD-10-CM | POA: Diagnosis not present

## 2016-12-19 DIAGNOSIS — N471 Phimosis: Secondary | ICD-10-CM | POA: Diagnosis not present

## 2016-12-26 DIAGNOSIS — N471 Phimosis: Secondary | ICD-10-CM | POA: Diagnosis not present

## 2017-01-14 DIAGNOSIS — R5381 Other malaise: Secondary | ICD-10-CM | POA: Diagnosis not present

## 2017-01-14 DIAGNOSIS — G894 Chronic pain syndrome: Secondary | ICD-10-CM | POA: Diagnosis not present

## 2017-01-14 DIAGNOSIS — M47812 Spondylosis without myelopathy or radiculopathy, cervical region: Secondary | ICD-10-CM | POA: Diagnosis not present

## 2017-01-14 DIAGNOSIS — M5481 Occipital neuralgia: Secondary | ICD-10-CM | POA: Diagnosis not present

## 2017-01-14 DIAGNOSIS — M791 Myalgia: Secondary | ICD-10-CM | POA: Diagnosis not present

## 2017-01-14 DIAGNOSIS — M1612 Unilateral primary osteoarthritis, left hip: Secondary | ICD-10-CM | POA: Diagnosis not present

## 2017-01-14 DIAGNOSIS — M797 Fibromyalgia: Secondary | ICD-10-CM | POA: Diagnosis not present

## 2017-01-14 DIAGNOSIS — M5010 Cervical disc disorder with radiculopathy, unspecified cervical region: Secondary | ICD-10-CM | POA: Diagnosis not present

## 2017-01-28 DIAGNOSIS — G44209 Tension-type headache, unspecified, not intractable: Secondary | ICD-10-CM | POA: Diagnosis not present

## 2017-01-28 DIAGNOSIS — E119 Type 2 diabetes mellitus without complications: Secondary | ICD-10-CM | POA: Diagnosis not present

## 2017-01-28 DIAGNOSIS — E785 Hyperlipidemia, unspecified: Secondary | ICD-10-CM | POA: Diagnosis not present

## 2017-02-10 DIAGNOSIS — M1612 Unilateral primary osteoarthritis, left hip: Secondary | ICD-10-CM | POA: Diagnosis not present

## 2017-02-10 DIAGNOSIS — M47812 Spondylosis without myelopathy or radiculopathy, cervical region: Secondary | ICD-10-CM | POA: Diagnosis not present

## 2017-02-10 DIAGNOSIS — M5010 Cervical disc disorder with radiculopathy, unspecified cervical region: Secondary | ICD-10-CM | POA: Diagnosis not present

## 2017-02-10 DIAGNOSIS — R5381 Other malaise: Secondary | ICD-10-CM | POA: Diagnosis not present

## 2017-02-10 DIAGNOSIS — M797 Fibromyalgia: Secondary | ICD-10-CM | POA: Diagnosis not present

## 2017-02-10 DIAGNOSIS — M791 Myalgia: Secondary | ICD-10-CM | POA: Diagnosis not present

## 2017-02-10 DIAGNOSIS — G894 Chronic pain syndrome: Secondary | ICD-10-CM | POA: Diagnosis not present

## 2017-02-10 DIAGNOSIS — M5481 Occipital neuralgia: Secondary | ICD-10-CM | POA: Diagnosis not present

## 2017-02-11 DIAGNOSIS — M1990 Unspecified osteoarthritis, unspecified site: Secondary | ICD-10-CM | POA: Diagnosis not present

## 2017-02-11 DIAGNOSIS — R609 Edema, unspecified: Secondary | ICD-10-CM | POA: Diagnosis not present

## 2017-02-11 DIAGNOSIS — I25119 Atherosclerotic heart disease of native coronary artery with unspecified angina pectoris: Secondary | ICD-10-CM | POA: Diagnosis not present

## 2017-02-11 DIAGNOSIS — R0602 Shortness of breath: Secondary | ICD-10-CM | POA: Diagnosis not present

## 2017-02-11 DIAGNOSIS — I442 Atrioventricular block, complete: Secondary | ICD-10-CM | POA: Diagnosis not present

## 2017-02-11 DIAGNOSIS — G44209 Tension-type headache, unspecified, not intractable: Secondary | ICD-10-CM | POA: Diagnosis not present

## 2017-02-11 DIAGNOSIS — N400 Enlarged prostate without lower urinary tract symptoms: Secondary | ICD-10-CM | POA: Diagnosis not present

## 2017-02-11 DIAGNOSIS — E119 Type 2 diabetes mellitus without complications: Secondary | ICD-10-CM | POA: Diagnosis not present

## 2017-02-11 DIAGNOSIS — R351 Nocturia: Secondary | ICD-10-CM | POA: Diagnosis not present

## 2017-02-17 DIAGNOSIS — M5481 Occipital neuralgia: Secondary | ICD-10-CM | POA: Diagnosis not present

## 2017-02-17 DIAGNOSIS — M1612 Unilateral primary osteoarthritis, left hip: Secondary | ICD-10-CM | POA: Diagnosis not present

## 2017-02-17 DIAGNOSIS — G894 Chronic pain syndrome: Secondary | ICD-10-CM | POA: Diagnosis not present

## 2017-02-17 DIAGNOSIS — M791 Myalgia: Secondary | ICD-10-CM | POA: Diagnosis not present

## 2017-02-17 DIAGNOSIS — M47812 Spondylosis without myelopathy or radiculopathy, cervical region: Secondary | ICD-10-CM | POA: Diagnosis not present

## 2017-02-17 DIAGNOSIS — M797 Fibromyalgia: Secondary | ICD-10-CM | POA: Diagnosis not present

## 2017-02-17 DIAGNOSIS — R5381 Other malaise: Secondary | ICD-10-CM | POA: Diagnosis not present

## 2017-02-17 DIAGNOSIS — M5010 Cervical disc disorder with radiculopathy, unspecified cervical region: Secondary | ICD-10-CM | POA: Diagnosis not present

## 2017-02-18 ENCOUNTER — Inpatient Hospital Stay (HOSPITAL_COMMUNITY)
Admission: EM | Admit: 2017-02-18 | Discharge: 2017-02-23 | DRG: 391 | Disposition: A | Discharge status: Home / Self Care | Payer: Medicare Other | Attending: Internal Medicine | Admitting: Internal Medicine

## 2017-02-18 ENCOUNTER — Emergency Department (HOSPITAL_COMMUNITY): Payer: Medicare Other

## 2017-02-18 ENCOUNTER — Encounter (HOSPITAL_COMMUNITY): Payer: Self-pay

## 2017-02-18 DIAGNOSIS — Z79891 Long term (current) use of opiate analgesic: Secondary | ICD-10-CM

## 2017-02-18 DIAGNOSIS — I2510 Atherosclerotic heart disease of native coronary artery without angina pectoris: Secondary | ICD-10-CM | POA: Diagnosis not present

## 2017-02-18 DIAGNOSIS — R112 Nausea with vomiting, unspecified: Secondary | ICD-10-CM | POA: Diagnosis not present

## 2017-02-18 DIAGNOSIS — E872 Acidosis: Secondary | ICD-10-CM | POA: Diagnosis not present

## 2017-02-18 DIAGNOSIS — E876 Hypokalemia: Secondary | ICD-10-CM | POA: Diagnosis present

## 2017-02-18 DIAGNOSIS — I10 Essential (primary) hypertension: Secondary | ICD-10-CM | POA: Diagnosis present

## 2017-02-18 DIAGNOSIS — R05 Cough: Secondary | ICD-10-CM | POA: Diagnosis present

## 2017-02-18 DIAGNOSIS — G8929 Other chronic pain: Secondary | ICD-10-CM | POA: Diagnosis present

## 2017-02-18 DIAGNOSIS — Z7982 Long term (current) use of aspirin: Secondary | ICD-10-CM

## 2017-02-18 DIAGNOSIS — J159 Unspecified bacterial pneumonia: Secondary | ICD-10-CM | POA: Diagnosis present

## 2017-02-18 DIAGNOSIS — Z7952 Long term (current) use of systemic steroids: Secondary | ICD-10-CM

## 2017-02-18 DIAGNOSIS — R7989 Other specified abnormal findings of blood chemistry: Secondary | ICD-10-CM | POA: Diagnosis present

## 2017-02-18 DIAGNOSIS — Z8711 Personal history of peptic ulcer disease: Secondary | ICD-10-CM

## 2017-02-18 DIAGNOSIS — I459 Conduction disorder, unspecified: Secondary | ICD-10-CM | POA: Diagnosis present

## 2017-02-18 DIAGNOSIS — F1011 Alcohol abuse, in remission: Secondary | ICD-10-CM | POA: Diagnosis present

## 2017-02-18 DIAGNOSIS — E8729 Other acidosis: Secondary | ICD-10-CM | POA: Diagnosis present

## 2017-02-18 DIAGNOSIS — A319 Mycobacterial infection, unspecified: Secondary | ICD-10-CM | POA: Diagnosis present

## 2017-02-18 DIAGNOSIS — R1013 Epigastric pain: Secondary | ICD-10-CM | POA: Diagnosis not present

## 2017-02-18 DIAGNOSIS — E860 Dehydration: Secondary | ICD-10-CM | POA: Diagnosis not present

## 2017-02-18 DIAGNOSIS — H4010X0 Unspecified open-angle glaucoma, stage unspecified: Secondary | ICD-10-CM | POA: Diagnosis present

## 2017-02-18 DIAGNOSIS — J189 Pneumonia, unspecified organism: Secondary | ICD-10-CM | POA: Diagnosis not present

## 2017-02-18 DIAGNOSIS — R778 Other specified abnormalities of plasma proteins: Secondary | ICD-10-CM | POA: Diagnosis present

## 2017-02-18 DIAGNOSIS — R197 Diarrhea, unspecified: Secondary | ICD-10-CM | POA: Diagnosis not present

## 2017-02-18 DIAGNOSIS — K5730 Diverticulosis of large intestine without perforation or abscess without bleeding: Secondary | ICD-10-CM | POA: Diagnosis not present

## 2017-02-18 DIAGNOSIS — R9431 Abnormal electrocardiogram [ECG] [EKG]: Secondary | ICD-10-CM | POA: Diagnosis not present

## 2017-02-18 DIAGNOSIS — Z950 Presence of cardiac pacemaker: Secondary | ICD-10-CM

## 2017-02-18 DIAGNOSIS — K529 Noninfective gastroenteritis and colitis, unspecified: Secondary | ICD-10-CM | POA: Diagnosis not present

## 2017-02-18 DIAGNOSIS — E119 Type 2 diabetes mellitus without complications: Secondary | ICD-10-CM | POA: Diagnosis present

## 2017-02-18 DIAGNOSIS — Z79899 Other long term (current) drug therapy: Secondary | ICD-10-CM

## 2017-02-18 DIAGNOSIS — Z833 Family history of diabetes mellitus: Secondary | ICD-10-CM

## 2017-02-18 DIAGNOSIS — B9620 Unspecified Escherichia coli [E. coli] as the cause of diseases classified elsewhere: Secondary | ICD-10-CM | POA: Diagnosis present

## 2017-02-18 DIAGNOSIS — N179 Acute kidney failure, unspecified: Secondary | ICD-10-CM | POA: Diagnosis present

## 2017-02-18 DIAGNOSIS — I442 Atrioventricular block, complete: Secondary | ICD-10-CM | POA: Diagnosis present

## 2017-02-18 DIAGNOSIS — E785 Hyperlipidemia, unspecified: Secondary | ICD-10-CM | POA: Diagnosis present

## 2017-02-18 DIAGNOSIS — Z86011 Personal history of benign neoplasm of the brain: Secondary | ICD-10-CM

## 2017-02-18 DIAGNOSIS — R748 Abnormal levels of other serum enzymes: Secondary | ICD-10-CM | POA: Diagnosis not present

## 2017-02-18 DIAGNOSIS — Z96642 Presence of left artificial hip joint: Secondary | ICD-10-CM | POA: Diagnosis present

## 2017-02-18 DIAGNOSIS — K219 Gastro-esophageal reflux disease without esophagitis: Secondary | ICD-10-CM | POA: Diagnosis present

## 2017-02-18 DIAGNOSIS — J181 Lobar pneumonia, unspecified organism: Secondary | ICD-10-CM

## 2017-02-18 DIAGNOSIS — Z7984 Long term (current) use of oral hypoglycemic drugs: Secondary | ICD-10-CM

## 2017-02-18 LAB — CBC
HEMATOCRIT: 50 % (ref 39.0–52.0)
HEMOGLOBIN: 17.7 g/dL — AB (ref 13.0–17.0)
MCH: 32.7 pg (ref 26.0–34.0)
MCHC: 35.4 g/dL (ref 30.0–36.0)
MCV: 92.3 fL (ref 78.0–100.0)
Platelets: 156 10*3/uL (ref 150–400)
RBC: 5.42 MIL/uL (ref 4.22–5.81)
RDW: 13.2 % (ref 11.5–15.5)
WBC: 12.2 10*3/uL — ABNORMAL HIGH (ref 4.0–10.5)

## 2017-02-18 LAB — COMPREHENSIVE METABOLIC PANEL
ALBUMIN: 4.5 g/dL (ref 3.5–5.0)
ALT: 23 U/L (ref 17–63)
ANION GAP: 19 — AB (ref 5–15)
AST: 48 U/L — ABNORMAL HIGH (ref 15–41)
Alkaline Phosphatase: 81 U/L (ref 38–126)
BILIRUBIN TOTAL: 0.6 mg/dL (ref 0.3–1.2)
BUN: 11 mg/dL (ref 6–20)
CO2: 17 mmol/L — AB (ref 22–32)
Calcium: 9.9 mg/dL (ref 8.9–10.3)
Chloride: 101 mmol/L (ref 101–111)
Creatinine, Ser: 1.34 mg/dL — ABNORMAL HIGH (ref 0.61–1.24)
GFR calc Af Amer: 56 mL/min — ABNORMAL LOW (ref >60)
GFR calc non Af Amer: 49 mL/min — ABNORMAL LOW (ref >60)
GLUCOSE: 172 mg/dL — AB (ref 65–99)
POTASSIUM: 3.9 mmol/L (ref 3.5–5.1)
SODIUM: 137 mmol/L (ref 135–145)
TOTAL PROTEIN: 8.9 g/dL — AB (ref 6.5–8.1)

## 2017-02-18 LAB — URINALYSIS, MICROSCOPIC (REFLEX)
RBC / HPF: NONE SEEN RBC/hpf (ref 0–5)
WBC UA: NONE SEEN WBC/hpf (ref 0–5)

## 2017-02-18 LAB — URINALYSIS, ROUTINE W REFLEX MICROSCOPIC
BILIRUBIN URINE: NEGATIVE
Glucose, UA: NEGATIVE mg/dL
Hgb urine dipstick: NEGATIVE
Ketones, ur: 15 mg/dL — AB
Leukocytes, UA: NEGATIVE
Nitrite: NEGATIVE
PH: 6 (ref 5.0–8.0)
Protein, ur: 100 mg/dL — AB
SPECIFIC GRAVITY, URINE: 1.025 (ref 1.005–1.030)

## 2017-02-18 LAB — LIPASE, BLOOD: LIPASE: 23 U/L (ref 11–51)

## 2017-02-18 LAB — I-STAT CG4 LACTIC ACID, ED: LACTIC ACID, VENOUS: 3.11 mmol/L — AB (ref 0.5–1.9)

## 2017-02-18 LAB — I-STAT TROPONIN, ED: Troponin i, poc: 0.27 ng/mL (ref 0.00–0.08)

## 2017-02-18 MED ORDER — ASPIRIN 325 MG PO TABS: 325.0000 mg | ORAL_TABLET | Freq: Once | ORAL | Status: DC

## 2017-02-18 MED ORDER — INSULIN ASPART 100 UNIT/ML ~~LOC~~ SOLN
0.0000 [IU] | SUBCUTANEOUS | Status: DC
  Administered 2017-02-19: 1 [IU] via SUBCUTANEOUS
  Administered 2017-02-19: 2 [IU] via SUBCUTANEOUS

## 2017-02-18 MED ORDER — SODIUM CHLORIDE 0.9 % IV BOLUS (SEPSIS)
500.0000 mL | Freq: Once | INTRAVENOUS | Status: AC
  Administered 2017-02-18: 500 mL via INTRAVENOUS

## 2017-02-18 MED ORDER — ONDANSETRON 4 MG PO TBDP: 4.0000 mg | ORAL_TABLET | Freq: Once | ORAL | Status: DC | PRN

## 2017-02-18 MED ORDER — CEFTRIAXONE SODIUM 1 G IJ SOLR: 1.0000 g | Freq: Once | INTRAMUSCULAR | Status: DC

## 2017-02-18 MED ORDER — ONDANSETRON HCL 4 MG/2ML IJ SOLN
4.0000 mg | Freq: Once | INTRAMUSCULAR | Status: AC
  Administered 2017-02-18: 4 mg via INTRAVENOUS
  Filled 2017-02-18: qty 2

## 2017-02-18 MED ORDER — IOPAMIDOL (ISOVUE-300) INJECTION 61%
100.0000 mL | Freq: Once | INTRAVENOUS | Status: AC | PRN
  Administered 2017-02-18: 100 mL via INTRAVENOUS

## 2017-02-18 MED ORDER — ENOXAPARIN SODIUM 40 MG/0.4ML ~~LOC~~ SOLN
40.0000 mg | Freq: Every day | SUBCUTANEOUS | Status: DC
  Administered 2017-02-19 – 2017-02-23 (×5): 40 mg via SUBCUTANEOUS
  Filled 2017-02-18 (×5): qty 0.4

## 2017-02-18 MED ORDER — ASPIRIN 81 MG PO CHEW
162.0000 mg | CHEWABLE_TABLET | Freq: Once | ORAL | Status: AC
  Administered 2017-02-18: 162 mg via ORAL
  Filled 2017-02-18: qty 2

## 2017-02-18 MED ORDER — IOPAMIDOL (ISOVUE-370) INJECTION 76%
INTRAVENOUS | Status: AC
  Filled 2017-02-18: qty 100

## 2017-02-18 MED ORDER — ONDANSETRON HCL 4 MG/2ML IJ SOLN
4.0000 mg | Freq: Four times a day (QID) | INTRAMUSCULAR | Status: DC | PRN
Start: 2017-02-18 — End: 2017-02-23
  Administered 2017-02-19 – 2017-02-21 (×5): 4 mg via INTRAVENOUS
  Filled 2017-02-18 (×6): qty 2

## 2017-02-18 MED ORDER — AZITHROMYCIN 500 MG IV SOLR: 500.0000 mg | Freq: Once | INTRAVENOUS | Status: DC

## 2017-02-18 MED ORDER — MORPHINE SULFATE (PF) 4 MG/ML IV SOLN
4.0000 mg | Freq: Once | INTRAVENOUS | Status: AC
Start: 2017-02-18 — End: 2017-02-18
  Administered 2017-02-18: 4 mg via INTRAVENOUS
  Filled 2017-02-18: qty 1

## 2017-02-18 MED ORDER — SODIUM CHLORIDE 0.9 % IV SOLN
INTRAVENOUS | Status: DC
  Administered 2017-02-19 – 2017-02-20 (×4): via INTRAVENOUS

## 2017-02-18 MED ORDER — SODIUM CHLORIDE 0.9 % IV BOLUS (SEPSIS)
1000.0000 mL | Freq: Once | INTRAVENOUS | Status: AC
  Administered 2017-02-18: 1000 mL via INTRAVENOUS

## 2017-02-18 NOTE — ED Triage Notes (Signed)
Pr Pt, Pt is coming from home with complaints of upper abdominal pain x 3 days with some nausea and vomiting. Pt reports that he has been vomiting all day and complains of pain in his stomach. States that he has been falling and weak.

## 2017-02-18 NOTE — ED Notes (Signed)
Patient transported to X-ray 

## 2017-02-18 NOTE — ED Notes (Signed)
Informed first nurse Janett Billow of I-stat troponin 0.27

## 2017-02-18 NOTE — Consult Note (Signed)
CARDIOLOGY CONSULT NOTE   Referring Physician: Dr. Jennette Kettle Primary Physician: Primary Cardiologist: Reason for Consultation: elevated troponin, paced ECG, concern for ACS   HPI: Consult at the request of attending Dr. Jennette Kettle, for management of elevated troponin in epigastric pain syndrome  Antonio Le is a 82 year old gentlemen with PMH of complete heart block s/p dual chamber pacemaker February 19, 2011, diabetes, hypertension, hyperlipidemia who presented after one day of severe upper abdominal pain. He endorses mild abdominal pain for several days, but today it became acutely worse. The pain is sharp, located below the xiphoid process, radiates in a small band both left and right. It is associated with nausea, vomiting, and diarrhea. The pain comes on irregularly, both with rest and exertion. It has been significant enough to cause him to double over and fall. He denies LOC, hitting his head, or other injury. He denies hematemesis, hematochezia, or melena. He denies chest pain, shortness of breath, PND, or orthopnea. He doesn't endorse clear fevers or chills, but he reports currently feeling very cold in a warm room. He endorses a mild chronic cough occasionally productive of phelgm but denies recent worsening in this or hemoptysis. He denies recently eating eggs but notes he eats mostly prepared foods from the grocery store since his wife passed away in February 19, 2015. No sick contacts. Recently traveled from [place].  Of note, on my review of his history, he denied ever having PUD or receiving an EGD, despite H. Pylori/PUD being listed in his chart. The chart also notes prior pancreatitis, which he does not recall. He does take 81 mg aspirin daily without any bleeding. He also endorses recently stopping his oxycodone and "dealing with the pain" on the advice of a family member. He notes that he takes about six pills/day but has difficulty naming them all. Does take his metformin, though not this  evening's dose. Notes that he does not have a cuff to check home blood pressure but notes it runs "on the high side" at his doctor's appointments.  In the ER, patient's vital were BP 176/77, HR 73, Temp 37.2, RR 26, O2 sat 96%. Labs notable for normal lipase, slightly elevated AST but otherwise normal LFTs, Cr above baseline at 1.34, BUN 11, Co2 17. Tpror 8.9 and  Hgb 17.7 suggest hemoconcentration. WBC 12.2. Istat troponin elevated at 0.27. UA with ketones and protein. Lactate 3.11. Imaging notable for mild reticulonodular opacification over lingula/LLL without acute abdominal process. Telemetry with atrial-sensed, ventricular-paced rhythm. ECG without Sgarbossa's criteria.  Prior cardiac evaluation: Cardiac catheterization 12/04/10: patient in complete heart block at the time Left main coronary artery was normal.  Left anterior descending artery was normal.  Distal vessel was small. The first diagonal branch was normal.  Second diagonal branch was normal.  Circumflex coronary artery was nondominant.  There was a large obtuse marginal branch, which was normal.  The AV groove branch had a 40% ostial lesion.  The right coronary artery was dominant, it was normal.  The posterolateral branches were small in caliber.  IMPRESSION:  The patient's chest pain is undoubtedly being caused by slow heart rate, incomplete heart block.  He has no significant coronary artery disease.  I talked to the patient at length and told him I would be very reluctant for him to leave since he was scheduled as an outpatient and after much discussion, he consented to having a permanent pacemaker placed.  I discussed the case with Dr. [name] Comes who will try to place his permanent  pacemaker later today.  Echo 10/31/2015: Study Conclusions  - Left ventricle: The cavity size was normal. Wall thickness was   increased in a pattern of mild LVH. Systolic function was normal.   The estimated ejection fraction was in the  range of 55% to 60%.   Wall motion was normal; there were no regional wall motion   abnormalities. Doppler parameters are consistent with abnormal   left ventricular relaxation (grade 1 diastolic dysfunction).  Impressions:  - Technically difficult; definity used; normal LV systolic   function; grade 1 diastolic dysfunction; prominent apical   hypertrophy; cannot R/O apical hypertrophic cardiomyopathy.  Review of Systems:     Cardiac Review of Systems: {Y] = yes [ ]  = no  Chest Pain [    ]  Resting SOB [   ] Exertional SOB  [  ]  Orthopnea [  ]   Pedal Edema [   ]    Palpitations [  ] Syncope  [  ]   Presyncope [   ]  General Review of Systems: [Y] = yes [  ]=no Constitional: recent weight change [  ]; anorexia [  ]; fatigue [ Y ]; nausea [  ]; night sweats [  ]; fever [Y  ]; or chills [  ];  Eyes : blurred vision [  ]; diplopia [   ]; vision changes [  ];  Amaurosis fugax[  ]; Resp: cough [Y  ];  wheezing[  ];  hemoptysis[  ];  PND [  ];  GI:  gallstones[  ], vomiting[Y ];  dysphagia[  ]; melena[  ];  hematochezia [  ]; heartburn[  ];   GU: kidney stones [  ]; hematuria[  ];   dysuria [  ];  nocturia[  ]; incontinence [  ];             Skin: rash, swelling[  ];, hair loss[  ];  peripheral edema[  ];  or itching[  ]; Musculosketetal: myalgias[  ];  joint swelling[  ];  joint erythema[  ];  joint pain[  ];  back pain[Y  ];  Heme/Lymph: bruising[  ];  bleeding[  ];  anemia[  ];  Neuro: TIA[  ];  headaches[  ];  stroke[  ];  vertigo[  ];  seizures[  ];   paresthesias[  ];  difficulty walking[  ];  Psych:depression[  ]; anxiety[  ];  Endocrine: diabetes[  ];  thyroid dysfunction[  ];  Other:  Past Medical History:  Diagnosis Date  . Bradycardia    Dr. Harrington Challenger is cardiologist, holter monitor (36-128 bpm), now has mri compatible pacemaker f/b dr. [name] Comes  . CAD (coronary artery disease)    cath 01/12 no CAD, no records of reported silent MI in [facility], in [place], normal  [place] 2007, EF 23%, mild diastolic dysfunction  . Cervical spondylarthritis   . Chronic constipation    GI referral to Dr. Oletta Lamas in the past, pt did not go  . GERD (gastroesophageal reflux disease)   . Glaucoma, open angle   . H. pylori infection    PUD association  . History of alcohol abuse   . Hyperlipidemia   . Hypertension   . Idiopathic acute pancreatitis   . 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 Renal insufficiency    creatinine ~1.2 in the past  . Syncopal episodes   . Tinea cruris     . [START ON  02/19/2017] enoxaparin (LOVENOX) injection  40 mg Subcutaneous Daily  . [START ON 02/19/2017] insulin aspart  0-9 Units Subcutaneous Q4H  . iopamidol        Infusions: . sodium chloride      No Known Allergies  Social History   Social History  . Marital status: Widowed    Spouse name: N/A  . Number of children: N/A  . Years of education: N/A   Occupational History  . Not on file.   Social History Main Topics  . Smoking status: Never Smoker  . Smokeless tobacco: Never Used  . Alcohol use No     Comment: stopped 1974  . Drug use: No  . 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 never smoked. heavy alcohol use in the past (>30 yr ago)   Other Topics Concern  . Not on file   Social History Narrative  . No narrative on file    Family History  Problem Relation Age of Onset  . Diabetes Mother   No known history of CAD in his family.  PHYSICAL EXAM: Vitals:   02/18/17 2030 02/18/17 2200  BP: (!) 176/77 (!) 148/75  Pulse: 73 73  Resp: (!) 26 12  Temp:      Intake/Output Summary (Last 24 hours) at 02/18/17 2347 Last data filed at 02/18/17 2109  Gross per 24 hour  Intake              500 ml  Output                0 ml  Net              500 ml   General:  Ill appearing man, fair historia HEENT: NCAT, dry mucous membranes Neck: supple. no  JVD. Carotids 2+ bilat; no bruits. No lymphadenopathy or thryomegaly appreciated. Cor: PMI nondisplaced. Regular rate & rhythm. No rubs, gallops or murmurs. Lungs: diminished at left base, no rales or wheezing. Abdomen: bowel sounds present. Nontender to light palpation, some mild tenderness to deep palpation but no rebound tenderness. Extremities: no cyanosis, clubbing, rash, edema Neuro: alert & oriented x 3, cranial nerves grossly intact. moves all 4 extremities w/o difficulty.  ECG: atrial-sensed, ventricular-paced rhythm  Results for orders placed or performed during the hospital encounter of 02/18/17 (from the past 24 hour(s))  Lipase, blood     Status: None   Collection Time: 02/18/17  5:57 PM  Result Value Ref Range   Lipase 23 11 - 51 U/L  Comprehensive metabolic panel     Status: Abnormal   Collection Time: 02/18/17  5:57 PM  Result Value Ref Range   Sodium 137 135 - 145 mmol/L   Potassium 3.9 3.5 - 5.1 mmol/L   Chloride 101 101 - 111 mmol/L   CO2 17 (L) 22 - 32 mmol/L   Glucose, Bld 172 (H) 65 - 99 mg/dL   BUN 11 6 - 20 mg/dL   Creatinine, Ser 1.34 (H) 0.61 - 1.24 mg/dL   Calcium 9.9 8.9 - 10.3 mg/dL   Total Protein 8.9 (H) 6.5 - 8.1 g/dL   Albumin 4.5 3.5 - 5.0 g/dL   AST 48 (H) 15 - 41 U/L   ALT 23 17 - 63 U/L   Alkaline Phosphatase 81 38 - 126 U/L   Total Bilirubin 0.6 0.3 - 1.2 mg/dL   GFR calc non Af  Amer 49 (L) >60 mL/min   GFR calc Af Amer 56 (L) >60 mL/min   Anion gap 19 (H) 5 - 15  CBC     Status: Abnormal   Collection Time: 02/18/17  5:57 PM  Result Value Ref Range   WBC 12.2 (H) 4.0 - 10.5 K/uL   RBC 5.42 4.22 - 5.81 MIL/uL   Hemoglobin 17.7 (H) 13.0 - 17.0 g/dL   HCT 50.0 39.0 - 52.0 %   MCV 92.3 78.0 - 100.0 fL   MCH 32.7 26.0 - 34.0 pg   MCHC 35.4 30.0 - 36.0 g/dL   RDW 13.2 11.5 - 15.5 %   Platelets 156 150 - 400 K/uL  I-stat troponin, ED     Status: Abnormal   Collection Time: 02/18/17  6:26 PM  Result Value Ref Range   Troponin i, poc 0.27  (HH) 0.00 - 0.08 ng/mL   Comment NOTIFIED PHYSICIAN    Comment 3          Urinalysis, Routine w reflex microscopic     Status: Abnormal   Collection Time: 02/18/17  9:05 PM  Result Value Ref Range   Color, Urine YELLOW YELLOW   APPearance CLEAR CLEAR   Specific Gravity, Urine 1.025 1.005 - 1.030   pH 6.0 5.0 - 8.0   Glucose, UA NEGATIVE NEGATIVE mg/dL   Hgb urine dipstick NEGATIVE NEGATIVE   Bilirubin Urine NEGATIVE NEGATIVE   Ketones, ur 15 (A) NEGATIVE mg/dL   Protein, ur 100 (A) NEGATIVE mg/dL   Nitrite NEGATIVE NEGATIVE   Leukocytes, UA NEGATIVE NEGATIVE  Urinalysis, Microscopic (reflex)     Status: Abnormal   Collection Time: 02/18/17  9:05 PM  Result Value Ref Range   RBC / HPF NONE SEEN 0 - 5 RBC/hpf   WBC, UA NONE SEEN 0 - 5 WBC/hpf   Bacteria, UA RARE (A) NONE SEEN   Squamous Epithelial / LPF 0-5 (A) NONE SEEN  I-Stat CG4 Lactic Acid, ED     Status: Abnormal   Collection Time: 02/18/17 11:28 PM  Result Value Ref Range   Lactic Acid, Venous 3.11 (HH) 0.5 - 1.9 mmol/L   Comment NOTIFIED PHYSICIAN    Ct Abdomen Pelvis W Contrast  Result Date: 02/18/2017 CLINICAL DATA:  Abdominal pain and vomiting. EXAM: CT ABDOMEN AND PELVIS WITH CONTRAST TECHNIQUE: Multidetector CT imaging of the abdomen and pelvis was performed using the standard protocol following bolus administration of intravenous contrast. CONTRAST:  175mL ISOVUE-300 IOPAMIDOL (ISOVUE-300) INJECTION 61% COMPARISON:  09/14/2007 FINDINGS: Lower chest: Lung bases demonstrate subtle reticulonodular/tree-in-bud opacification over the lingula and left lower lobe. Cardiac pacer wires present. Hepatobiliary: Liver, gallbladder and biliary tree are within normal. Pancreas: Within normal. Spleen: Within normal. Adrenals/Urinary Tract: Adrenal glands are normal. Kidneys are normal in size without hydronephrosis or nephrolithiasis. Ureters and bladder are normal. Stomach/Bowel: Stomach and small bowel are within normal. Appendix is  normal. Mild diverticulosis of the colon. Vascular/Lymphatic: Minimal calcified plaque involving the abdominal aorta and iliac arteries. No adenopathy. Reproductive: Within normal. Other: Small left inguinal hernia containing only mesenteric fat. No free peritoneal fluid or focal inflammatory change. Musculoskeletal: Left total hip arthroplasty. Degenerative change of the spine and right hip. IMPRESSION: No acute findings in the abdomen/pelvis. Mild reticulonodular/ tree-in-bud pattern of opacification over the lingula and left lower lobe which may be due to bacterial pneumonia versus mycobacterium. Recommend followup CT 6-8 weeks. Mild colonic diverticulosis. Small left inguinal hernia containing only mesenteric fat. Electronically Signed   By: Quillian Quince  Derrel Nip M.D.   On: 02/18/2017 21:04   Dg Abd Acute W/chest  Result Date: 02/18/2017 CLINICAL DATA:  Epigastric abdominal pain. Chills. Nausea, vomiting and diarrhea. EXAM: DG ABDOMEN ACUTE W/ 1V CHEST COMPARISON:  None. FINDINGS: Bowel gas pattern is normal without evidence of ileus, obstruction or free air. No worrisome calcifications or bone findings. Previous left hip replacement. One-view chest shows normal heart size. Pacemaker in place. Lungs are clear. No acute bone finding. IMPRESSION: Negative acute abdominal series. Electronically Signed   By: Nelson Chimes M.D.   On: 02/18/2017 20:34   ASSESSMENT AND PLAN: 1. Abdominal pain, nausea, vomiting, diarrhea: given prodrome, symptoms, acidosis, elevated lactate on exam, concern for gastrointestinal process causing dehydration, such as viral gastroenteritis or foodborne illness. However, cannot exclude this is referred pain from either lower lobe pneumonia or other process. Cannot fully exclude coronary syndrome (see below).  -CT imaging negative, lipase negative, LFTs near normal. Does not suggest acute hepatitis, pancreatitis, or diverticulitis. -treatment and additional workup as per primary team. Agree  with fluid resuscitation, monitor acid-base status closely  2. Elevated troponin, concern for acute coronary syndrome: ventricularly paced rhythm makes ECG evaluation of ischemia difficult, but no clear Sgarbossa's criteria for a STEMI. Had near normal coronaries in 2012, has had several unremarkable nuclear perfusion studies. No wall motion abnormalities on prior echoes. However, while his pain syndrome is not typical for ACS, the initial elevated troponin is concerning. Bedside echo attempted, but patient has poor acoustic windows (as noted on his prior echoes). -lab-run troponin resulted at 0.03. This is just above the cutoff of normal. Would continue to trend. -if troponins rise with trending, would start heparin drip (and d/c lovenox prophylaxis) as patient denies any recent bleeding. If heparin drip started, would make sure to keep blood pressure <520 systolic (and ideally <802 systolic) to avoid stroke.  -Continue aspirin 81 mg daily (if troponins rise, would give one time dose of 324 mg and then return to 81 mg aspirin daily). Do not give P2Y12. Continue home rosuvastatin 20 mg.  -if pain recurs or troponins continue to rise, low threshold for full stat echo overnight (may need echo contrast given poor windows). -consider SL NG if pain recurs  3. Anion gap metabolic acidosis: GI losses through emesis/diarrhea are balanced per patient, does not explain gap. Lactate is elevate, and patient also has ketones in urine. Patient is on metformin. Though renal function is decreased from prior, unclear if he has had an interval decline in GFR (ie more representative of CKD than AKI).  -agree w/primary team's further workup. If acidosis and elevated lactate persists despite fluid resuscitation and treatment would get ABG.  Please call if there are any questions, and thank you for allowing [place] to participate in this patient's care.

## 2017-02-18 NOTE — H&P (Signed)
History and Physical    Antonio Le BBC:[id number] DOB: 1935-05-29 DOA: 02/18/2017  PCP: [name], MD  Patient coming from: Home  I have personally briefly reviewed patient's old medical records in [place]  Chief Complaint: N/V/D  HPI: Antonio Le is a 82 y.o. male with medical history significant of CAD, non-occlusive disease on cath in 2012, heart block with pacer, DM.  Patient presents to the ED with c/o 3 day history of nausea, vomiting, diarrhea, epigastric abdominal pain.  Abd pain had been mild for the past 3 days however got acutely worse today.  No CP, no SOB, no cough, no fevers.  No sick contacts, hasnt eaten eggs recently.   ED Course: Trop of 0.26, has AG of 19 with bicarb of 17.  CT abd/pelvis is unremarkable other than some tiny tree-in bud appearance of LLL lung.   Review of Systems: As per HPI otherwise 10 point review of systems negative.   Past Medical History:  Diagnosis Date  . Bradycardia    Dr. Harrington Challenger is cardiologist, holter monitor (36-128 bpm), now has mri compatible pacemaker f/b dr. [name] Comes  . CAD (coronary artery disease)    cath 01/12 no CAD, no records of reported silent MI in [facility], in [place], normal [place] 2007, EF 50%, mild diastolic dysfunction  . Cervical spondylarthritis   . Chronic constipation    GI referral to Dr. Oletta Lamas in the past, pt did not go  . GERD (gastroesophageal reflux disease)   . Glaucoma, open angle   . H. pylori infection    PUD association  . History of alcohol abuse   . Hyperlipidemia   . Hypertension   . Idiopathic acute pancreatitis   . 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 Renal insufficiency    creatinine ~1.2 in the past  . Syncopal episodes   . Tinea cruris     Past Surgical History:  Procedure Laterality Date  . CATARACT EXTRACTION    . left hip replacement    . PACEMAKER INSERTION     Revo Sure Scan pacemaker     reports that he has never smoked. He has  never used smokeless tobacco. He reports that he does not drink alcohol or use drugs.  No Known Allergies  Family History  Problem Relation Age of Onset  . Diabetes Mother      Prior to Admission medications   Medication Sig Start Date End Date Taking? Authorizing Provider  aspirin 81 MG tablet Take 81 mg by mouth daily.    Yes Historical Provider, MD  clonazePAM (KLONOPIN) 0.5 MG tablet Take 1 mg by mouth 2 (two) times daily as needed for anxiety. For anxiety   Yes Historical Provider, MD  metFORMIN (GLUCOPHAGE-XR) 500 MG 24 hr tablet Take 500 mg by mouth 2 (two) times daily.    Yes Historical Provider, MD  AMITIZA 24 MCG capsule Take 24 mcg by mouth 2 (two) times daily.  10/10/15   Historical Provider, MD  CRANBERRY PO Take 2 tablets by mouth daily.    Historical Provider, MD  darifenacin (ENABLEX) 15 MG 24 hr tablet Take 15 mg by mouth daily.    Historical Provider, MD  Doxylamine Succinate, Sleep, (SLEEP AID PO) Take 2 tablets by mouth at bedtime.    Historical Provider, MD  famotidine (PEPCID) 20 MG tablet Take 1 tablet (20 mg total) by mouth 2 (two) times daily. 05/16/16   Junius Creamer, NP  guaiFENesin (MUCINEX) 600 MG 12 hr tablet Take 1,200 mg by mouth at bedtime.    Historical Provider, MD  MYRBETRIQ 50 MG TB24 tablet Take 50 mg by mouth daily. Reported on 05/16/2016 08/31/15   Historical Provider, MD  Nutritional Supplements (BLADDER 2.2 PO) Take 2 tablets by mouth at bedtime.    Historical Provider, MD  omeprazole (PRILOSEC) 40 MG capsule Take 40 mg by mouth daily.     Historical Provider, MD  oxyCODONE (ROXICODONE) 15 MG immediate release tablet Take 15 mg by mouth 4 (four) times daily. Take every day per patient 05/04/15   Historical Provider, MD  predniSONE (DELTASONE) 10 MG tablet Take 2 tablets (20 mg total) by mouth daily. 05/16/16   Junius Creamer, NP  RAPAFLO 8 MG CAPS capsule take 1 capsule by mouth once daily WITH EVENING MEAL 05/09/16   Historical Provider, MD  rosuvastatin  (CRESTOR) 20 MG tablet Take 10 mg by mouth daily.     Historical Provider, MD  Specialty Vitamins Products (PROSTATE PO) Take 1 tablet by mouth 2 (two) times daily.    Historical Provider, MD  tamsulosin (FLOMAX) 0.4 MG CAPS capsule Take 0.4 mg by mouth daily.  12/19/14   Historical Provider, MD  vitamin C (ASCORBIC ACID) 500 MG tablet Take 500 mg by mouth daily.    Historical Provider, MD    Physical Exam: Vitals:   02/18/17 1815 02/18/17 1900 02/18/17 1930 02/18/17 2030  BP: (!) 168/100 (!) 164/87 (!) 165/80 (!) 176/77  Pulse: 81 76 84 73  Resp:  20 (!) 22 (!) 26  Temp: 98.9 F (37.2 C)     TempSrc: Oral     SpO2: 98% 96% 94% 96%  Weight:      Height:        Constitutional: NAD, calm, comfortable Eyes: PERRL, lids and conjunctivae normal ENMT: Mucous membranes are moist. Posterior pharynx clear of any exudate or lesions.Normal dentition.  Neck: normal, supple, no masses, no thyromegaly Respiratory: clear to auscultation bilaterally, no wheezing, no crackles. Normal respiratory effort. No accessory muscle use.  Cardiovascular: Regular rate and rhythm, no murmurs / rubs / gallops. No extremity edema. 2+ pedal pulses. No carotid bruits.  Abdomen: no tenderness, no masses palpated. No hepatosplenomegaly. Bowel sounds positive.  Musculoskeletal: no clubbing / cyanosis. No joint deformity upper and lower extremities. Good ROM, no contractures. Normal muscle tone.  Skin: no rashes, lesions, ulcers. No induration Neurologic: CN 2-12 grossly intact. Sensation intact, DTR normal. Strength 5/5 in all 4.  Psychiatric: Normal judgment and insight. Alert and oriented x 3. Normal mood.    Labs on Admission: I have personally reviewed following labs and imaging studies  CBC:  Recent Labs Lab 02/18/17 1757  WBC 12.2*  HGB 17.7*  HCT 50.0  MCV 92.3  PLT 588   Basic Metabolic Panel:  Recent Labs Lab 02/18/17 1757  NA 137  K 3.9  CL 101  CO2 17*  GLUCOSE 172*  BUN 11  CREATININE  1.34*  CALCIUM 9.9   GFR: Estimated Creatinine Clearance: 44.7 mL/min (A) (by C-G formula based on SCr of 1.34 mg/dL (H)). Liver Function Tests:  Recent Labs Lab 02/18/17 1757  AST 48*  ALT 23  ALKPHOS 81  BILITOT 0.6  PROT 8.9*  ALBUMIN 4.5    Recent Labs Lab 02/18/17 1757  LIPASE 23   No results for input(s): AMMONIA in the last 168 hours. Coagulation Profile: No results for input(s): INR, PROTIME in the last 168 hours. Cardiac  Enzymes: No results for input(s): CKTOTAL, CKMB, CKMBINDEX, TROPONINI in the last 168 hours. BNP (last 3 results) No results for input(s): PROBNP in the last 8760 hours. HbA1C: No results for input(s): HGBA1C in the last 72 hours. CBG: No results for input(s): GLUCAP in the last 168 hours. Lipid Profile: No results for input(s): CHOL, HDL, LDLCALC, TRIG, CHOLHDL, LDLDIRECT in the last 72 hours. Thyroid Function Tests: No results for input(s): TSH, T4TOTAL, FREET4, T3FREE, THYROIDAB in the last 72 hours. Anemia Panel: No results for input(s): VITAMINB12, FOLATE, FERRITIN, TIBC, IRON, RETICCTPCT in the last 72 hours. Urine analysis:    Component Value Date/Time   COLORURINE YELLOW 02/18/2017 2105   APPEARANCEUR CLEAR 02/18/2017 2105   LABSPEC 1.025 02/18/2017 2105   PHURINE 6.0 02/18/2017 2105   GLUCOSEU NEGATIVE 02/18/2017 2105   GLUCOSEU NEG mg/dL 04/04/2008 2022   HGBUR NEGATIVE 02/18/2017 2105   BILIRUBINUR NEGATIVE 02/18/2017 2105   KETONESUR 15 (A) 02/18/2017 2105   PROTEINUR 100 (A) 02/18/2017 2105   UROBILINOGEN 0.2 09/18/2015 1835   NITRITE NEGATIVE 02/18/2017 2105   LEUKOCYTESUR NEGATIVE 02/18/2017 2105    Radiological Exams on Admission: Ct Abdomen Pelvis W Contrast  Result Date: 02/18/2017 CLINICAL DATA:  Abdominal pain and vomiting. EXAM: CT ABDOMEN AND PELVIS WITH CONTRAST TECHNIQUE: Multidetector CT imaging of the abdomen and pelvis was performed using the standard protocol following bolus administration of  intravenous contrast. CONTRAST:  174mL ISOVUE-300 IOPAMIDOL (ISOVUE-300) INJECTION 61% COMPARISON:  09/14/2007 FINDINGS: Lower chest: Lung bases demonstrate subtle reticulonodular/tree-in-bud opacification over the lingula and left lower lobe. Cardiac pacer wires present. Hepatobiliary: Liver, gallbladder and biliary tree are within normal. Pancreas: Within normal. Spleen: Within normal. Adrenals/Urinary Tract: Adrenal glands are normal. Kidneys are normal in size without hydronephrosis or nephrolithiasis. Ureters and bladder are normal. Stomach/Bowel: Stomach and small bowel are within normal. Appendix is normal. Mild diverticulosis of the colon. Vascular/Lymphatic: Minimal calcified plaque involving the abdominal aorta and iliac arteries. No adenopathy. Reproductive: Within normal. Other: Small left inguinal hernia containing only mesenteric fat. No free peritoneal fluid or focal inflammatory change. Musculoskeletal: Left total hip arthroplasty. Degenerative change of the spine and right hip. IMPRESSION: No acute findings in the abdomen/pelvis. Mild reticulonodular/ tree-in-bud pattern of opacification over the lingula and left lower lobe which may be due to bacterial pneumonia versus mycobacterium. Recommend followup CT 6-8 weeks. Mild colonic diverticulosis. Small left inguinal hernia containing only mesenteric fat. Electronically Signed   By: Marin Olp M.D.   On: 02/18/2017 21:04   Dg Abd Acute W/chest  Result Date: 02/18/2017 CLINICAL DATA:  Epigastric abdominal pain. Chills. Nausea, vomiting and diarrhea. EXAM: DG ABDOMEN ACUTE W/ 1V CHEST COMPARISON:  None. FINDINGS: Bowel gas pattern is normal without evidence of ileus, obstruction or free air. No worrisome calcifications or bone findings. Previous left hip replacement. One-view chest shows normal heart size. Pacemaker in place. Lungs are clear. No acute bone finding. IMPRESSION: Negative acute abdominal series. Electronically Signed   By: Nelson Chimes M.D.   On: 02/18/2017 20:34    EKG: Independently reviewed.  Assessment/Plan Principal Problem:   Nausea vomiting and diarrhea Active Problems:   Coronary artery disease-nonobstructive cath 2012   Type 2 diabetes mellitus with hemoglobin A1c goal of less than 7.0% (HCC)   Elevated troponin   High anion gap metabolic acidosis    1. N/V/D - ? Gastroenteritis 1. IVF 2. Repeat BMP in AM 3. zofran PRN nausea 4. GI pathogen panel 2. Elevated trop - 1. Serial  trops 2. Cards has seen at bedside 3. Doubt ACS 3. High AG acidosis - 1. Lactic acid pending 2. Repeat BMP in AM 4. DM2 - 1. Sensitive scale SSI Q4H 5. Incidental finding on CT chest - clinically dont think patient has PNA at this point, will hold off on ABx  DVT prophylaxis: Lovenox Code Status: Full Family Communication: No family in room Disposition Plan: Home after admit Consults called: none Admission status: Place in obs   Sravya Grissom, [facility] Pager [phone]  If 7AM-7PM, please contact day team taking care of patient [email or web address] Password [facility]  02/18/2017, 10:36 PM

## 2017-02-18 NOTE — ED Provider Notes (Signed)
[facility] DEPT Provider Note   CSN: [id number] Arrival date & time: 02/18/17  1745     History   Chief Complaint Chief Complaint  Patient presents with  . Abdominal Pain    HPI Antonio Le is a 82 y.o. male hx of CAD, GERD, HTN, HL, pacemaker here with epigastric pain, vomiting. Patient has mild epigastric pain for the last 3 days. However the pain got acutely worse today and he has been doubled over in pain. That he has been feeling nauseated and had perfuse vomiting. Patient adamantly denies any chest pain or shortness of breath. Has some nonproductive cough but denies any fevers.   The history is provided by the patient.    Past Medical History:  Diagnosis Date  . Bradycardia    Dr. Harrington Challenger is cardiologist, holter monitor (36-128 bpm), now has mri compatible pacemaker f/b dr. [name] Comes  . CAD (coronary artery disease)    cath 01/12 no CAD, no records of reported silent MI in [facility], in [place], normal [place] 2007, EF 33%, mild diastolic dysfunction  . Cervical spondylarthritis   . Chronic constipation    GI referral to Dr. Oletta Lamas in the past, pt did not go  . GERD (gastroesophageal reflux disease)   . Glaucoma, open angle   . H. pylori infection    PUD association  . History of alcohol abuse   . Hyperlipidemia   . Hypertension   . Idiopathic acute pancreatitis   . 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 Renal insufficiency    creatinine ~1.2 in the past  . Syncopal episodes   . Tinea cruris     Patient Active Problem List   Diagnosis Date Noted  . DM type 2, goal A1c below 7 11/01/2015  . Syncope and collapse 10/30/2015  . Adhesive capsulitis of left shoulder 01/01/2013  . Coronary artery disease-nonobstructive cath 2012 07/23/2011  . Complete heart block ([place]) 03/18/2011  . Pacemaker-St. Jude-dual 03/18/2011  . Chest pain 03/18/2011  . BACK PAIN 04/04/2008  . URINARY HESITANCY 04/04/2008  . PUD 09/15/2007  . MENINGIOMA  05/26/2007  . HIP PAIN, LEFT 05/26/2007  . MRI, BRAIN, ABNORMAL 01/29/2007  . RENAL INSUFFICIENCY 11/24/2006  . TINEA CRURIS 08/27/2006  . HYPERLIPIDEMIA 08/27/2006  . GLAUCOMA 08/27/2006  . HYPERTENSION 08/27/2006  . Sinus bradycardia 08/27/2006  . GERD 08/27/2006  . CONSTIPATION 08/27/2006  . DEGENERATIVE DISC DISEASE, CERVICAL SPINE 08/27/2006    Past Surgical History:  Procedure Laterality Date  . CATARACT EXTRACTION    . left hip replacement    . PACEMAKER INSERTION     Revo Sure Scan pacemaker       Home Medications    Prior to Admission medications   Medication Sig Start Date End Date Taking? Authorizing Provider  AMITIZA 24 MCG capsule Take 24 mcg by mouth 2 (two) times daily.  10/10/15   Historical Provider, MD  aspirin 81 MG tablet Take 81 mg by mouth daily.     Historical Provider, MD  clonazePAM (KLONOPIN) 0.5 MG tablet Take 1 mg by mouth 2 (two) times daily as needed for anxiety. For anxiety    Historical Provider, MD  CRANBERRY PO Take 2 tablets by mouth daily.    Historical Provider, MD  darifenacin (ENABLEX) 15 MG 24 hr tablet Take 15 mg by mouth daily.    Historical Provider, MD  Doxylamine Succinate, Sleep, (SLEEP AID PO) Take 2 tablets by mouth at bedtime.  Historical Provider, MD  famotidine (PEPCID) 20 MG tablet Take 1 tablet (20 mg total) by mouth 2 (two) times daily. 05/16/16   Junius Creamer, NP  guaiFENesin (MUCINEX) 600 MG 12 hr tablet Take 1,200 mg by mouth at bedtime.    Historical Provider, MD  metFORMIN (GLUCOPHAGE-XR) 500 MG 24 hr tablet Take 500 mg by mouth 2 (two) times daily.     Historical Provider, MD  MYRBETRIQ 50 MG TB24 tablet Take 50 mg by mouth daily. Reported on 05/16/2016 08/31/15   Historical Provider, MD  Nutritional Supplements (BLADDER 2.2 PO) Take 2 tablets by mouth at bedtime.    Historical Provider, MD  omeprazole (PRILOSEC) 40 MG capsule Take 40 mg by mouth daily.     Historical Provider, MD  oxyCODONE (ROXICODONE) 15 MG  immediate release tablet Take 15 mg by mouth 4 (four) times daily. Take every day per patient 05/04/15   Historical Provider, MD  predniSONE (DELTASONE) 10 MG tablet Take 2 tablets (20 mg total) by mouth daily. 05/16/16   Junius Creamer, NP  RAPAFLO 8 MG CAPS capsule take 1 capsule by mouth once daily WITH EVENING MEAL 05/09/16   Historical Provider, MD  rosuvastatin (CRESTOR) 20 MG tablet Take 10 mg by mouth daily.     Historical Provider, MD  Specialty Vitamins Products (PROSTATE PO) Take 1 tablet by mouth 2 (two) times daily.    Historical Provider, MD  tamsulosin (FLOMAX) 0.4 MG CAPS capsule Take 0.4 mg by mouth daily.  12/19/14   Historical Provider, MD  vitamin C (ASCORBIC ACID) 500 MG tablet Take 500 mg by mouth daily.    Historical Provider, MD    Family History Family History  Problem Relation Age of Onset  . Diabetes Mother     Social History Social History  Substance Use Topics  . Smoking status: Never Smoker  . Smokeless tobacco: Never Used  . Alcohol use No     Comment: stopped 1974     Allergies   Patient has no known allergies.   Review of Systems Review of Systems  Gastrointestinal: Positive for abdominal pain and vomiting.  All other systems reviewed and are negative.    Physical Exam Updated Vital Signs BP (!) 176/77   Pulse 73   Temp 98.9 F (37.2 C) (Oral)   Resp (!) 26   Ht 5' 5.5" (1.664 m)   Wt 182 lb (82.6 kg)   SpO2 96%   BMI 29.83 kg/m   Physical Exam  Constitutional: He is oriented to person, place, and time.  Uncomfortable, vomiting, dehydrated   HENT:  Head: Normocephalic.  MM slightly dry   Eyes: EOM are normal. Pupils are equal, round, and reactive to light.  Neck: Normal range of motion. Neck supple.  Cardiovascular: Normal rate, regular rhythm and normal heart sounds.   Pulmonary/Chest: Effort normal. No respiratory distress.  Diminished bilateral bases   Abdominal: Soft. Bowel sounds are normal.  Mild epigastric tenderness, no  rebound or guarding. No RUQ tenderness   Musculoskeletal: Normal range of motion.  Neurological: He is alert and oriented to person, place, and time.  Skin: Skin is warm.  Nursing note and vitals reviewed.    ED Treatments / Results  Labs (all labs ordered are listed, but only abnormal results are displayed) Labs Reviewed  COMPREHENSIVE METABOLIC PANEL - Abnormal; Notable for the following:       Result Value   CO2 17 (*)    Glucose, Bld 172 (*)  Creatinine, Ser 1.34 (*)    Total Protein 8.9 (*)    AST 48 (*)    GFR calc non Af Amer 49 (*)    GFR calc Af Amer 56 (*)    Anion gap 19 (*)    All other components within normal limits  CBC - Abnormal; Notable for the following:    WBC 12.2 (*)    Hemoglobin 17.7 (*)    All other components within normal limits  URINALYSIS, ROUTINE W REFLEX MICROSCOPIC - Abnormal; Notable for the following:    Ketones, ur 15 (*)    Protein, ur 100 (*)    All other components within normal limits  URINALYSIS, MICROSCOPIC (REFLEX) - Abnormal; Notable for the following:    Bacteria, UA RARE (*)    Squamous Epithelial / LPF 0-5 (*)    All other components within normal limits  I-STAT TROPOININ, ED - Abnormal; Notable for the following:    Troponin i, poc 0.27 (*)    All other components within normal limits  CULTURE, BLOOD (ROUTINE X 2)  CULTURE, BLOOD (ROUTINE X 2)  LIPASE, BLOOD  I-STAT CG4 LACTIC ACID, ED    EKG  EKG Interpretation  Date/Time:  Tuesday February 18 2017 18:50:23 EDT Ventricular Rate:  82 PR Interval:  186 QRS Duration: 180 QT Interval:  465 QTC Calculation: 544 R Axis:   -46 Text Interpretation:  Sinus rhythm Borderline prolonged PR interval Left bundle branch block No significant change since last tracing Confirmed by Bush Murdoch  MD, Yasenia Reedy ([id number]) on 02/18/2017 9:05:01 PM       Radiology Ct Abdomen Pelvis W Contrast  Result Date: 02/18/2017 CLINICAL DATA:  Abdominal pain and vomiting. EXAM: CT ABDOMEN AND PELVIS WITH  CONTRAST TECHNIQUE: Multidetector CT imaging of the abdomen and pelvis was performed using the standard protocol following bolus administration of intravenous contrast. CONTRAST:  173mL ISOVUE-300 IOPAMIDOL (ISOVUE-300) INJECTION 61% COMPARISON:  09/14/2007 FINDINGS: Lower chest: Lung bases demonstrate subtle reticulonodular/tree-in-bud opacification over the lingula and left lower lobe. Cardiac pacer wires present. Hepatobiliary: Liver, gallbladder and biliary tree are within normal. Pancreas: Within normal. Spleen: Within normal. Adrenals/Urinary Tract: Adrenal glands are normal. Kidneys are normal in size without hydronephrosis or nephrolithiasis. Ureters and bladder are normal. Stomach/Bowel: Stomach and small bowel are within normal. Appendix is normal. Mild diverticulosis of the colon. Vascular/Lymphatic: Minimal calcified plaque involving the abdominal aorta and iliac arteries. No adenopathy. Reproductive: Within normal. Other: Small left inguinal hernia containing only mesenteric fat. No free peritoneal fluid or focal inflammatory change. Musculoskeletal: Left total hip arthroplasty. Degenerative change of the spine and right hip. IMPRESSION: No acute findings in the abdomen/pelvis. Mild reticulonodular/ tree-in-bud pattern of opacification over the lingula and left lower lobe which may be due to bacterial pneumonia versus mycobacterium. Recommend followup CT 6-8 weeks. Mild colonic diverticulosis. Small left inguinal hernia containing only mesenteric fat. Electronically Signed   By: Marin Olp M.D.   On: 02/18/2017 21:04   Dg Abd Acute W/chest  Result Date: 02/18/2017 CLINICAL DATA:  Epigastric abdominal pain. Chills. Nausea, vomiting and diarrhea. EXAM: DG ABDOMEN ACUTE W/ 1V CHEST COMPARISON:  None. FINDINGS: Bowel gas pattern is normal without evidence of ileus, obstruction or free air. No worrisome calcifications or bone findings. Previous left hip replacement. One-view chest shows normal heart  size. Pacemaker in place. Lungs are clear. No acute bone finding. IMPRESSION: Negative acute abdominal series. Electronically Signed   By: Nelson Chimes M.D.   On: 02/18/2017 20:34  Procedures Procedures (including critical care time)  Medications Ordered in ED Medications  ondansetron (ZOFRAN-ODT) disintegrating tablet 4 mg (not administered)  iopamidol (ISOVUE-370) 76 % injection (not administered)  cefTRIAXone (ROCEPHIN) 1 g in dextrose 5 % 50 mL IVPB (not administered)  azithromycin (ZITHROMAX) 500 mg in dextrose 5 % 250 mL IVPB (not administered)  sodium chloride 0.9 % bolus 500 mL (0 mLs Intravenous Stopped 02/18/17 2109)  morphine 4 MG/ML injection 4 mg (4 mg Intravenous Given 02/18/17 1920)  ondansetron (ZOFRAN) injection 4 mg (4 mg Intravenous Given 02/18/17 1920)  aspirin chewable tablet 162 mg (162 mg Oral Given 02/18/17 1920)  sodium chloride 0.9 % bolus 1,000 mL (1,000 mLs Intravenous New Bag/Given 02/18/17 2108)  iopamidol (ISOVUE-300) 61 % injection 100 mL (100 mLs Intravenous Contrast Given 02/18/17 2033)     Initial Impression / Assessment and Plan / ED Course  I have reviewed the triage vital signs and the nursing notes.  Pertinent labs & imaging results that were available during my care of the patient were reviewed by me and considered in my medical decision making (see chart for details).     ZYIERE ROSEMOND is a 82 y.o. male here with epigastric pain, vomiting. Consider SBO vs gastritis vs aspiration. Will get labs, EKG, trop, lipase, CT ab/pel.   10:09 PM Trop 0.27. Repeat EKG still paced. Denies chest pain or shortness of breath. CT ab/pel unremarkable but there may be LL lobe pneumonia. Given ASA. I consulted cardiology, who will see patient. Given IV rocephin, azithro for possible aspiration pneumonia. Hospitalist will see patient to admit as well.    Final Clinical Impressions(s) / ED Diagnoses   Final diagnoses:  None    New Prescriptions New  Prescriptions   No medications on file     Drenda Freeze, MD 02/18/17 2222

## 2017-02-18 NOTE — ED Notes (Signed)
Patient denies any chest pain and appears diaphoretic with nausea and some vomiting

## 2017-02-18 NOTE — ED Notes (Signed)
Charge RN aware of trop and MD Darl Householder will move to next available room.

## 2017-02-19 ENCOUNTER — Encounter (HOSPITAL_COMMUNITY): Payer: Self-pay

## 2017-02-19 DIAGNOSIS — R748 Abnormal levels of other serum enzymes: Secondary | ICD-10-CM | POA: Diagnosis not present

## 2017-02-19 DIAGNOSIS — B9620 Unspecified Escherichia coli [E. coli] as the cause of diseases classified elsewhere: Secondary | ICD-10-CM | POA: Diagnosis present

## 2017-02-19 DIAGNOSIS — K529 Noninfective gastroenteritis and colitis, unspecified: Secondary | ICD-10-CM | POA: Diagnosis present

## 2017-02-19 DIAGNOSIS — Z950 Presence of cardiac pacemaker: Secondary | ICD-10-CM | POA: Diagnosis not present

## 2017-02-19 DIAGNOSIS — F1011 Alcohol abuse, in remission: Secondary | ICD-10-CM | POA: Diagnosis present

## 2017-02-19 DIAGNOSIS — E872 Acidosis: Secondary | ICD-10-CM | POA: Diagnosis not present

## 2017-02-19 DIAGNOSIS — Z86011 Personal history of benign neoplasm of the brain: Secondary | ICD-10-CM | POA: Diagnosis not present

## 2017-02-19 DIAGNOSIS — J159 Unspecified bacterial pneumonia: Secondary | ICD-10-CM | POA: Diagnosis present

## 2017-02-19 DIAGNOSIS — I2510 Atherosclerotic heart disease of native coronary artery without angina pectoris: Secondary | ICD-10-CM | POA: Diagnosis present

## 2017-02-19 DIAGNOSIS — I10 Essential (primary) hypertension: Secondary | ICD-10-CM | POA: Diagnosis present

## 2017-02-19 DIAGNOSIS — K219 Gastro-esophageal reflux disease without esophagitis: Secondary | ICD-10-CM | POA: Diagnosis present

## 2017-02-19 DIAGNOSIS — R112 Nausea with vomiting, unspecified: Secondary | ICD-10-CM | POA: Diagnosis not present

## 2017-02-19 DIAGNOSIS — A319 Mycobacterial infection, unspecified: Secondary | ICD-10-CM | POA: Diagnosis present

## 2017-02-19 DIAGNOSIS — N179 Acute kidney failure, unspecified: Secondary | ICD-10-CM | POA: Diagnosis present

## 2017-02-19 DIAGNOSIS — Z96642 Presence of left artificial hip joint: Secondary | ICD-10-CM | POA: Diagnosis present

## 2017-02-19 DIAGNOSIS — Z8711 Personal history of peptic ulcer disease: Secondary | ICD-10-CM | POA: Diagnosis not present

## 2017-02-19 DIAGNOSIS — H4010X0 Unspecified open-angle glaucoma, stage unspecified: Secondary | ICD-10-CM | POA: Diagnosis present

## 2017-02-19 DIAGNOSIS — A09 Infectious gastroenteritis and colitis, unspecified: Secondary | ICD-10-CM | POA: Diagnosis not present

## 2017-02-19 DIAGNOSIS — I459 Conduction disorder, unspecified: Secondary | ICD-10-CM | POA: Diagnosis present

## 2017-02-19 DIAGNOSIS — E860 Dehydration: Secondary | ICD-10-CM | POA: Diagnosis not present

## 2017-02-19 DIAGNOSIS — R05 Cough: Secondary | ICD-10-CM | POA: Diagnosis present

## 2017-02-19 DIAGNOSIS — I442 Atrioventricular block, complete: Secondary | ICD-10-CM | POA: Diagnosis present

## 2017-02-19 DIAGNOSIS — E876 Hypokalemia: Secondary | ICD-10-CM | POA: Diagnosis present

## 2017-02-19 DIAGNOSIS — E785 Hyperlipidemia, unspecified: Secondary | ICD-10-CM | POA: Diagnosis present

## 2017-02-19 DIAGNOSIS — G8929 Other chronic pain: Secondary | ICD-10-CM | POA: Diagnosis present

## 2017-02-19 DIAGNOSIS — R197 Diarrhea, unspecified: Secondary | ICD-10-CM | POA: Diagnosis not present

## 2017-02-19 DIAGNOSIS — E119 Type 2 diabetes mellitus without complications: Secondary | ICD-10-CM | POA: Diagnosis not present

## 2017-02-19 LAB — C DIFFICILE QUICK SCREEN W PCR REFLEX
C Diff antigen: NEGATIVE
C Diff interpretation: NOT DETECTED
C Diff toxin: NEGATIVE

## 2017-02-19 LAB — CBC
HEMATOCRIT: 43.7 % (ref 39.0–52.0)
HEMOGLOBIN: 14.9 g/dL (ref 13.0–17.0)
MCH: 31.8 pg (ref 26.0–34.0)
MCHC: 34.1 g/dL (ref 30.0–36.0)
MCV: 93.4 fL (ref 78.0–100.0)
PLATELETS: 157 10*3/uL (ref 150–400)
RBC: 4.68 MIL/uL (ref 4.22–5.81)
RDW: 13.8 % (ref 11.5–15.5)
WBC: 13.7 10*3/uL — AB (ref 4.0–10.5)

## 2017-02-19 LAB — BASIC METABOLIC PANEL
ANION GAP: 13 (ref 5–15)
BUN: 11 mg/dL (ref 6–20)
CALCIUM: 8.6 mg/dL — AB (ref 8.9–10.3)
CO2: 23 mmol/L (ref 22–32)
Chloride: 103 mmol/L (ref 101–111)
Creatinine, Ser: 1.15 mg/dL (ref 0.61–1.24)
GFR calc Af Amer: >60 mL/min (ref >60)
GFR, EST NON AFRICAN AMERICAN: 59 mL/min — AB (ref >60)
GLUCOSE: 146 mg/dL — AB (ref 65–99)
POTASSIUM: 3.1 mmol/L — AB (ref 3.5–5.1)
SODIUM: 139 mmol/L (ref 135–145)

## 2017-02-19 LAB — TROPONIN I
TROPONIN I: 0.05 ng/mL — AB (ref <0.03)
TROPONIN I: 0.05 ng/mL — AB (ref <0.03)
Troponin I: 0.03 ng/mL (ref <0.03)

## 2017-02-19 LAB — GLUCOSE, CAPILLARY
GLUCOSE-CAPILLARY: 130 mg/dL — AB (ref 65–99)
GLUCOSE-CAPILLARY: 140 mg/dL — AB (ref 65–99)
GLUCOSE-CAPILLARY: 169 mg/dL — AB (ref 65–99)
Glucose-Capillary: 141 mg/dL — ABNORMAL HIGH (ref 65–99)
Glucose-Capillary: 101 mg/dL — ABNORMAL HIGH (ref 65–99)
Glucose-Capillary: 97 mg/dL (ref 65–99)

## 2017-02-19 LAB — LACTIC ACID, PLASMA
LACTIC ACID, VENOUS: 2.8 mmol/L — AB (ref 0.5–1.9)
Lactic Acid, Venous: 1.7 mmol/L (ref 0.5–1.9)

## 2017-02-19 MED ORDER — INSULIN ASPART 100 UNIT/ML ~~LOC~~ SOLN
0.0000 [IU] | Freq: Three times a day (TID) | SUBCUTANEOUS | Status: DC
  Administered 2017-02-19: 2 [IU] via SUBCUTANEOUS
  Administered 2017-02-20: 3 [IU] via SUBCUTANEOUS
  Administered 2017-02-20 – 2017-02-23 (×8): 2 [IU] via SUBCUTANEOUS

## 2017-02-19 MED ORDER — POTASSIUM CHLORIDE CRYS ER 20 MEQ PO TBCR
40.0000 meq | EXTENDED_RELEASE_TABLET | Freq: Once | ORAL | Status: AC
  Administered 2017-02-19: 40 meq via ORAL
  Filled 2017-02-19: qty 2

## 2017-02-19 MED ORDER — HYOSCYAMINE SULFATE 0.125 MG SL SUBL
0.2500 mg | SUBLINGUAL_TABLET | Freq: Once | SUBLINGUAL | Status: AC
  Administered 2017-02-19: 0.25 mg via SUBLINGUAL
  Filled 2017-02-19: qty 2

## 2017-02-19 MED ORDER — INSULIN ASPART 100 UNIT/ML ~~LOC~~ SOLN: 0.0000 [IU] | Freq: Every day | SUBCUTANEOUS | Status: DC

## 2017-02-19 NOTE — Care Management Obs Status (Signed)
[place] NOTIFICATION   Patient Details  Name: NYSHAUN STANDAGE MRN: [id number] Date of Birth: 1935/05/18   Medicare Observation Status Notification Given:  Yes ([facility], delivered copy. explained notice.)    Erenest Rasher, RN 02/19/2017, 5:17 PM

## 2017-02-19 NOTE — Progress Notes (Signed)
Pt arrived floor. AXOX4. Pt was oriented too room. Call light within reach. Vitals and assessment completed. Will continue to monitor pt.

## 2017-02-19 NOTE — Progress Notes (Signed)
PROGRESS NOTE    Antonio Le  WNU:[id number] DOB: 1935/06/19 DOA: 02/18/2017 PCP: [name], MD   Outpatient Specialists:     Brief Narrative:  Antonio Le is a 82 y.o. male with medical history significant of CAD, non-occlusive disease on cath in 2012, heart block with pacer, DM.  Patient presents to the ED with c/o 3 day history of nausea, vomiting, diarrhea, epigastric abdominal pain.  Abd pain had been mild for the past 3 days however got acutely worse today.  No CP, no SOB, no cough, no fevers.   Assessment & Plan:   Principal Problem:   Nausea vomiting and diarrhea Active Problems:   Coronary artery disease-nonobstructive cath 2012   Type 2 diabetes mellitus with hemoglobin A1c goal of less than 7.0% (HCC)   Elevated troponin   High anion gap metabolic acidosis   N/V/D - ? Gastroenteritis GI pathogen panel/cdiff are pending but patient appears to not have had more stools -advance diet as tolerated  Mildly Elevated trop - -seen by cardiology -no further evaluation -patient has appointment with Dr. [name] Comes in the AM  High AG acidosis - Lactic acid normal -resolved  DM2 - SSI  Incidental finding on CT chest -  -Dr. [name] clinically did not think patient has PNA at this point, will hold off on ABx -outpatient follow up -given dose of abx in ER  Hypokalemia -replete   DVT prophylaxis:  SCD's  Code Status: Full Code   Family Communication: patient  Disposition Plan:     Consultants:        Subjective: Asking for more food No further diarrhea here  Objective: Vitals:   02/18/17 2200 02/19/17 0039 02/19/17 0432 02/19/17 1352  BP: (!) 148/75 136/68 (!) 152/67 (!) 159/79  Pulse: 73 86 80 84  Resp: 12 18 18 20   Temp:  98.7 F (37.1 C) 98.6 F (37 C) 97.9 F (36.6 C)  TempSrc:      SpO2: 100% 97% 95% 99%  Weight:      Height:        Intake/Output Summary (Last 24 hours) at 02/19/17 1531 Last data filed at 02/19/17 1520  Gross per 24 hour  Intake          1831.25 ml  Output             1500 ml  Net           331.25 ml   Filed Weights   02/18/17 1756  Weight: 82.6 kg (182 lb)    Examination:  General exam: Appears calm and comfortable  Respiratory system: Clear to auscultation. Respiratory effort normal. Cardiovascular system: S1 & S2 heard, RRR. No JVD, murmurs, rubs, gallops or clicks. No pedal edema. Gastrointestinal system: Abdomen is mildly distended, nontender. No organomegaly or masses felt. Normal bowel sounds heard. Central nervous system: Alert and oriented. No focal neurological deficits. Psychiatry: pressured speech, tangential thoughts    Data Reviewed: I have personally reviewed following labs and imaging studies  CBC:  Recent Labs Lab 02/18/17 1757 02/19/17 0459  WBC 12.2* 13.7*  HGB 17.7* 14.9  HCT 50.0 43.7  MCV 92.3 93.4  PLT 156 034   Basic Metabolic Panel:  Recent Labs Lab 02/18/17 1757 02/19/17 0459  NA 137 139  K 3.9 3.1*  CL 101 103  CO2 17* 23  GLUCOSE 172* 146*  BUN 11 11  CREATININE 1.34* 1.15  CALCIUM 9.9 8.6*   GFR: Estimated Creatinine Clearance: 52.1 mL/min (by C-G  formula based on SCr of 1.15 mg/dL). Liver Function Tests:  Recent Labs Lab 02/18/17 1757  AST 48*  ALT 23  ALKPHOS 81  BILITOT 0.6  PROT 8.9*  ALBUMIN 4.5    Recent Labs Lab 02/18/17 1757  LIPASE 23   No results for input(s): AMMONIA in the last 168 hours. Coagulation Profile: No results for input(s): INR, PROTIME in the last 168 hours. Cardiac Enzymes:  Recent Labs Lab 02/18/17 2315 02/19/17 0459 02/19/17 1014  TROPONINI 0.03* 0.05* 0.05*   BNP (last 3 results) No results for input(s): PROBNP in the last 8760 hours. HbA1C: No results for input(s): HGBA1C in the last 72 hours. CBG:  Recent Labs Lab 02/19/17 0048 02/19/17 0428 02/19/17 0905 02/19/17 1123  GLUCAP 169* 141* 101* 97   Lipid Profile: No results for input(s): CHOL, HDL, LDLCALC, TRIG,  CHOLHDL, LDLDIRECT in the last 72 hours. Thyroid Function Tests: No results for input(s): TSH, T4TOTAL, FREET4, T3FREE, THYROIDAB in the last 72 hours. Anemia Panel: No results for input(s): VITAMINB12, FOLATE, FERRITIN, TIBC, IRON, RETICCTPCT in the last 72 hours. Urine analysis:    Component Value Date/Time   COLORURINE YELLOW 02/18/2017 2105   APPEARANCEUR CLEAR 02/18/2017 2105   LABSPEC 1.025 02/18/2017 2105   PHURINE 6.0 02/18/2017 2105   GLUCOSEU NEGATIVE 02/18/2017 2105   GLUCOSEU NEG mg/dL 04/04/2008 2022   HGBUR NEGATIVE 02/18/2017 2105   [place] NEGATIVE 02/18/2017 2105   KETONESUR 15 (A) 02/18/2017 2105   PROTEINUR 100 (A) 02/18/2017 2105   UROBILINOGEN 0.2 09/18/2015 1835   NITRITE NEGATIVE 02/18/2017 2105   LEUKOCYTESUR NEGATIVE 02/18/2017 2105     )No results found for this or any previous visit (from the past 240 hour(s)).    Anti-infectives    Start     Dose/Rate Route Frequency Ordered Stop   02/18/17 2145  cefTRIAXone (ROCEPHIN) 1 g in dextrose 5 % 50 mL IVPB  Status:  Discontinued     1 g 100 mL/hr over 30 Minutes Intravenous  Once 02/18/17 2139 02/18/17 2223   02/18/17 2145  azithromycin (ZITHROMAX) 500 mg in dextrose 5 % 250 mL IVPB  Status:  Discontinued     500 mg 250 mL/hr over 60 Minutes Intravenous  Once 02/18/17 2139 02/18/17 2223       Radiology Studies: Ct Abdomen Pelvis W Contrast  Result Date: 02/18/2017 CLINICAL DATA:  Abdominal pain and vomiting. EXAM: CT ABDOMEN AND PELVIS WITH CONTRAST TECHNIQUE: Multidetector CT imaging of the abdomen and pelvis was performed using the standard protocol following bolus administration of intravenous contrast. CONTRAST:  142mL ISOVUE-300 IOPAMIDOL (ISOVUE-300) INJECTION 61% COMPARISON:  09/14/2007 FINDINGS: Lower chest: Lung bases demonstrate subtle reticulonodular/tree-in-bud opacification over the lingula and left lower lobe. Cardiac pacer wires present. Hepatobiliary: Liver, gallbladder and biliary  tree are within normal. Pancreas: Within normal. Spleen: Within normal. Adrenals/Urinary Tract: Adrenal glands are normal. Kidneys are normal in size without hydronephrosis or nephrolithiasis. Ureters and bladder are normal. Stomach/Bowel: Stomach and small bowel are within normal. Appendix is normal. Mild diverticulosis of the colon. Vascular/Lymphatic: Minimal calcified plaque involving the abdominal aorta and iliac arteries. No adenopathy. Reproductive: Within normal. Other: Small left inguinal hernia containing only mesenteric fat. No free peritoneal fluid or focal inflammatory change. Musculoskeletal: Left total hip arthroplasty. Degenerative change of the spine and right hip. IMPRESSION: No acute findings in the abdomen/pelvis. Mild reticulonodular/ tree-in-bud pattern of opacification over the lingula and left lower lobe which may be due to bacterial pneumonia versus mycobacterium. Recommend followup CT  6-8 weeks. Mild colonic diverticulosis. Small left inguinal hernia containing only mesenteric fat. Electronically Signed   By: Marin Olp M.D.   On: 02/18/2017 21:04   Dg Abd Acute W/chest  Result Date: 02/18/2017 CLINICAL DATA:  Epigastric abdominal pain. Chills. Nausea, vomiting and diarrhea. EXAM: DG ABDOMEN ACUTE W/ 1V CHEST COMPARISON:  None. FINDINGS: Bowel gas pattern is normal without evidence of ileus, obstruction or free air. No worrisome calcifications or bone findings. Previous left hip replacement. One-view chest shows normal heart size. Pacemaker in place. Lungs are clear. No acute bone finding. IMPRESSION: Negative acute abdominal series. Electronically Signed   By: Nelson Chimes M.D.   On: 02/18/2017 20:34        Scheduled Meds: . enoxaparin (LOVENOX) injection  40 mg Subcutaneous Daily  . insulin aspart  0-9 Units Subcutaneous Q4H   Continuous Infusions: . sodium chloride 125 mL/hr at 02/19/17 0903     LOS: 0 days    Time spent: 25 min   [place], DO [facility] Pager [phone]  If 7PM-7AM, please contact night-coverage [email or web address] Password [facility] 02/19/2017, 3:31 PM

## 2017-02-19 NOTE — Progress Notes (Signed)
Progress Note  Patient Name: Antonio Le Date of Encounter: 02/19/2017  Primary Cardiologist: [name] Comes    Subjective   Breathing OK  No CP    Inpatient Medications    Scheduled Meds: . enoxaparin (LOVENOX) injection  40 mg Subcutaneous Daily  . insulin aspart  0-9 Units Subcutaneous Q4H   Continuous Infusions: . sodium chloride 125 mL/hr at 02/19/17 0903   PRN Meds: ondansetron (ZOFRAN) IV, ondansetron   Vital Signs    Vitals:   02/18/17 2030 02/18/17 2200 02/19/17 0039 02/19/17 0432  BP: (!) 176/77 (!) 148/75 136/68 (!) 152/67  Pulse: 73 73 86 80  Resp: (!) 26 12 18 18   Temp:   98.7 F (37.1 C) 98.6 F (37 C)  TempSrc:      SpO2: 96% 100% 97% 95%  Weight:      Height:        Intake/Output Summary (Last 24 hours) at 02/19/17 1037 Last data filed at 02/19/17 0939  Gross per 24 hour  Intake          1168.75 ml  Output              200 ml  Net           968.75 ml   Filed Weights   02/18/17 1756  Weight: 182 lb (82.6 kg)    Telemetry    Paced   - Personally Reviewed  ECG     Physical Exam   GEN: No acute distress.   Neck: No JVD Cardiac: RRR, no murmurs, rubs, or gallops.  Respiratory: Clear to auscultation bilaterally. GI: Soft, nontender, non-distended   Increased BS   MS: No edema; No deformity. Neuro:  Nonfocal  Psych: Normal affect   Labs    Chemistry Recent Labs Lab 02/18/17 1757 02/19/17 0459  NA 137 139  K 3.9 3.1*  CL 101 103  CO2 17* 23  GLUCOSE 172* 146*  BUN 11 11  CREATININE 1.34* 1.15  CALCIUM 9.9 8.6*  PROT 8.9*  --   ALBUMIN 4.5  --   AST 48*  --   ALT 23  --   ALKPHOS 81  --   BILITOT 0.6  --   GFRNONAA 49* 59*  GFRAA 56* >60  ANIONGAP 19* 13     Hematology Recent Labs Lab 02/18/17 1757 02/19/17 0459  WBC 12.2* 13.7*  RBC 5.42 4.68  HGB 17.7* 14.9  HCT 50.0 43.7  MCV 92.3 93.4  MCH 32.7 31.8  MCHC 35.4 34.1  RDW 13.2 13.8  PLT 156 157    Cardiac Enzymes Recent Labs Lab 02/18/17 2315  02/19/17 0459  TROPONINI 0.03* 0.05*    Recent Labs Lab 02/18/17 1826  TROPIPOC 0.27*     BNPNo results for input(s): BNP, PROBNP in the last 168 hours.   DDimer No results for input(s): DDIMER in the last 168 hours.   Radiology    Ct Abdomen Pelvis W Contrast  Result Date: 02/18/2017 CLINICAL DATA:  Abdominal pain and vomiting. EXAM: CT ABDOMEN AND PELVIS WITH CONTRAST TECHNIQUE: Multidetector CT imaging of the abdomen and pelvis was performed using the standard protocol following bolus administration of intravenous contrast. CONTRAST:  141mL ISOVUE-300 IOPAMIDOL (ISOVUE-300) INJECTION 61% COMPARISON:  09/14/2007 FINDINGS: Lower chest: Lung bases demonstrate subtle reticulonodular/tree-in-bud opacification over the lingula and left lower lobe. Cardiac pacer wires present. Hepatobiliary: Liver, gallbladder and biliary tree are within normal. Pancreas: Within normal. Spleen: Within normal. Adrenals/Urinary Tract: Adrenal glands are normal. Kidneys  are normal in size without hydronephrosis or nephrolithiasis. Ureters and bladder are normal. Stomach/Bowel: Stomach and small bowel are within normal. Appendix is normal. Mild diverticulosis of the colon. Vascular/Lymphatic: Minimal calcified plaque involving the abdominal aorta and iliac arteries. No adenopathy. Reproductive: Within normal. Other: Small left inguinal hernia containing only mesenteric fat. No free peritoneal fluid or focal inflammatory change. Musculoskeletal: Left total hip arthroplasty. Degenerative change of the spine and right hip. IMPRESSION: No acute findings in the abdomen/pelvis. Mild reticulonodular/ tree-in-bud pattern of opacification over the lingula and left lower lobe which may be due to bacterial pneumonia versus mycobacterium. Recommend followup CT 6-8 weeks. Mild colonic diverticulosis. Small left inguinal hernia containing only mesenteric fat. Electronically Signed   By: Marin Olp M.D.   On: 02/18/2017 21:04   Dg  Abd Acute W/chest  Result Date: 02/18/2017 CLINICAL DATA:  Epigastric abdominal pain. Chills. Nausea, vomiting and diarrhea. EXAM: DG ABDOMEN ACUTE W/ 1V CHEST COMPARISON:  None. FINDINGS: Bowel gas pattern is normal without evidence of ileus, obstruction or free air. No worrisome calcifications or bone findings. Previous left hip replacement. One-view chest shows normal heart size. Pacemaker in place. Lungs are clear. No acute bone finding. IMPRESSION: Negative acute abdominal series. Electronically Signed   By: Nelson Chimes M.D.   On: 02/18/2017 20:34    Cardiac Studies     Patient Profile     82 y.o. male  Hx of CHB with PPM 2012, DM, HTN, HL  Presented with abdominal pain yesterday N/V and diarrha.  Had felt good prior  Consult last night for trop (istat of 0.27)    Assessment & Plan   Pt familiar to me from clinic   I saw back in 2012  Now follows with Olin Pia   1  Elevated troponin  Troponin I 0.03 and 0.05   ALl in setting of acidosis I would continue to follow.  Pt with with history of no signif CAD in past  No recnet symptoms   2  Pacemaker  Pt has f/u appt with Olin Pia tentatively for tomorrow  Keep   3  GI  Clincally improved    4  Pulm  Per primary team=follow clinicially   Signed, Dorris Carnes, MD  02/19/2017, 10:37 AM

## 2017-02-19 NOTE — Care Management Note (Signed)
Case Management Note  Patient Details  Name: Antonio Le MRN: [id number] Date of Birth: [birth year]  Subjective/Objective:  Nausea, vomiting, diarrhea                  Action/Plan: Discharge Planning: NCM spoke to pt and lives in [place]. Here in [place] visiting grandson. States he plans to travel to see family and then head to [place] to stay with son. Has cane. No NCM needs identified.    Expected Discharge Date:               Expected Discharge Plan:  Home/Self Care  In-House Referral:  NA  Discharge planning Services  CM Consult  Post Acute Care Choice:  NA Choice offered to:  NA  DME Arranged:  N/A DME Agency:  NA  HH Arranged:  NA HH Agency:  NA  Status of Service:  Completed, signed off  If discussed at [place] of Stay Meetings, dates discussed:    Additional Comments:  Erenest Rasher, RN 02/19/2017, 5:18 PM

## 2017-02-19 NOTE — Progress Notes (Signed)
With  pt's cardiac hx, pain and troponin, MD was paged if pt can have cardiac telemetry monitoring ordered for the pt. Will continue to monitor.

## 2017-02-20 ENCOUNTER — Encounter: Payer: Medicare Other | Admitting: Internal Medicine

## 2017-02-20 LAB — MAGNESIUM: Magnesium: 1.5 mg/dL — ABNORMAL LOW (ref 1.7–2.4)

## 2017-02-20 LAB — GASTROINTESTINAL PANEL BY PCR, STOOL (REPLACES STOOL CULTURE)
Adenovirus F40/41: NOT DETECTED
Astrovirus: NOT DETECTED
Campylobacter species: NOT DETECTED
Cryptosporidium: NOT DETECTED
Cyclospora cayetanensis: NOT DETECTED
ENTEROAGGREGATIVE E COLI (EAEC): NOT DETECTED
ENTEROTOXIGENIC E COLI (ETEC): DETECTED — AB
Entamoeba histolytica: NOT DETECTED
Enteropathogenic E coli (EPEC): NOT DETECTED
Giardia lamblia: NOT DETECTED
NOROVIRUS GI/GII: NOT DETECTED
Plesimonas shigelloides: NOT DETECTED
ROTAVIRUS A: NOT DETECTED
SALMONELLA SPECIES: NOT DETECTED
SAPOVIRUS (I, II, IV, AND V): NOT DETECTED
SHIGA LIKE TOXIN PRODUCING E COLI (STEC): NOT DETECTED
SHIGELLA/ENTEROINVASIVE E COLI (EIEC): NOT DETECTED
Vibrio cholerae: NOT DETECTED
Vibrio species: NOT DETECTED
Yersinia enterocolitica: NOT DETECTED

## 2017-02-20 LAB — CBC
HCT: 44.5 % (ref 39.0–52.0)
Hemoglobin: 15.5 g/dL (ref 13.0–17.0)
MCH: 32.1 pg (ref 26.0–34.0)
MCHC: 34.8 g/dL (ref 30.0–36.0)
MCV: 92.1 fL (ref 78.0–100.0)
PLATELETS: 163 10*3/uL (ref 150–400)
RBC: 4.83 MIL/uL (ref 4.22–5.81)
RDW: 13.2 % (ref 11.5–15.5)
WBC: 11 10*3/uL — ABNORMAL HIGH (ref 4.0–10.5)

## 2017-02-20 LAB — BASIC METABOLIC PANEL
Anion gap: 11 (ref 5–15)
BUN: 5 mg/dL — AB (ref 6–20)
CALCIUM: 8.9 mg/dL (ref 8.9–10.3)
CO2: 26 mmol/L (ref 22–32)
Chloride: 99 mmol/L — ABNORMAL LOW (ref 101–111)
Creatinine, Ser: 1.03 mg/dL (ref 0.61–1.24)
GFR calc Af Amer: >60 mL/min (ref >60)
GLUCOSE: 131 mg/dL — AB (ref 65–99)
POTASSIUM: 2.9 mmol/L — AB (ref 3.5–5.1)
Sodium: 136 mmol/L (ref 135–145)

## 2017-02-20 LAB — GLUCOSE, CAPILLARY
GLUCOSE-CAPILLARY: 131 mg/dL — AB (ref 65–99)
Glucose-Capillary: 147 mg/dL — ABNORMAL HIGH (ref 65–99)
Glucose-Capillary: 108 mg/dL — ABNORMAL HIGH (ref 65–99)
Glucose-Capillary: 136 mg/dL — ABNORMAL HIGH (ref 65–99)
Glucose-Capillary: 165 mg/dL — ABNORMAL HIGH (ref 65–99)
Glucose-Capillary: 148 mg/dL — ABNORMAL HIGH (ref 65–99)

## 2017-02-20 MED ORDER — PANTOPRAZOLE SODIUM 40 MG PO TBEC
40.0000 mg | DELAYED_RELEASE_TABLET | Freq: Every day | ORAL | Status: DC
  Administered 2017-02-20 – 2017-02-23 (×4): 40 mg via ORAL
  Filled 2017-02-20 (×4): qty 1

## 2017-02-20 MED ORDER — MAGNESIUM SULFATE 2 GM/50ML IV SOLN
2.0000 g | Freq: Once | INTRAVENOUS | Status: AC
  Administered 2017-02-20: 2 g via INTRAVENOUS
  Filled 2017-02-20: qty 50

## 2017-02-20 MED ORDER — FAMOTIDINE 20 MG PO TABS
20.0000 mg | ORAL_TABLET | Freq: Two times a day (BID) | ORAL | Status: DC
  Administered 2017-02-20 – 2017-02-21 (×3): 20 mg via ORAL
  Filled 2017-02-20 (×3): qty 1

## 2017-02-20 MED ORDER — HYDRALAZINE HCL 20 MG/ML IJ SOLN
10.0000 mg | Freq: Four times a day (QID) | INTRAMUSCULAR | Status: DC | PRN
  Filled 2017-02-20: qty 1

## 2017-02-20 MED ORDER — TAMSULOSIN HCL 0.4 MG PO CAPS
0.4000 mg | ORAL_CAPSULE | Freq: Every day | ORAL | Status: DC
  Administered 2017-02-20 – 2017-02-23 (×4): 0.4 mg via ORAL
  Filled 2017-02-20 (×4): qty 1

## 2017-02-20 MED ORDER — POTASSIUM CHLORIDE CRYS ER 20 MEQ PO TBCR
40.0000 meq | EXTENDED_RELEASE_TABLET | ORAL | Status: AC
  Administered 2017-02-20 (×2): 40 meq via ORAL
  Filled 2017-02-20 (×2): qty 2

## 2017-02-20 MED ORDER — ZOLPIDEM TARTRATE 5 MG PO TABS
5.0000 mg | ORAL_TABLET | Freq: Once | ORAL | Status: DC
  Filled 2017-02-20: qty 1

## 2017-02-20 MED ORDER — PREDNISONE 20 MG PO TABS: 20.0000 mg | ORAL_TABLET | Freq: Every day | ORAL | Status: DC

## 2017-02-20 MED ORDER — DARIFENACIN HYDROBROMIDE ER 15 MG PO TB24
15.0000 mg | ORAL_TABLET | Freq: Every day | ORAL | Status: DC
  Administered 2017-02-20 – 2017-02-23 (×4): 15 mg via ORAL
  Filled 2017-02-20 (×5): qty 1

## 2017-02-20 MED ORDER — OXYCODONE HCL 5 MG PO TABS: 15.0000 mg | ORAL_TABLET | Freq: Four times a day (QID) | ORAL | Status: DC

## 2017-02-20 MED ORDER — GI COCKTAIL ~~LOC~~
30.0000 mL | Freq: Three times a day (TID) | ORAL | Status: DC | PRN
  Administered 2017-02-20: 30 mL via ORAL
  Filled 2017-02-20: qty 30

## 2017-02-20 MED ORDER — LUBIPROSTONE 24 MCG PO CAPS
24.0000 ug | ORAL_CAPSULE | Freq: Two times a day (BID) | ORAL | Status: DC
  Administered 2017-02-20 – 2017-02-23 (×7): 24 ug via ORAL
  Filled 2017-02-20 (×7): qty 1

## 2017-02-20 MED ORDER — ASPIRIN 81 MG PO TABS: 81.0000 mg | ORAL_TABLET | Freq: Every day | ORAL | Status: DC

## 2017-02-20 MED ORDER — POTASSIUM CHLORIDE CRYS ER 20 MEQ PO TBCR
40.0000 meq | EXTENDED_RELEASE_TABLET | Freq: Once | ORAL | Status: AC
  Administered 2017-02-20: 40 meq via ORAL
  Filled 2017-02-20: qty 2

## 2017-02-20 MED ORDER — OXYCODONE HCL 5 MG PO TABS
15.0000 mg | ORAL_TABLET | Freq: Four times a day (QID) | ORAL | Status: DC | PRN
Start: 2017-02-20 — End: 2017-02-23
  Administered 2017-02-20 – 2017-02-21 (×2): 15 mg via ORAL
  Filled 2017-02-20 (×2): qty 3

## 2017-02-20 MED ORDER — CLONAZEPAM 1 MG PO TABS
1.0000 mg | ORAL_TABLET | Freq: Two times a day (BID) | ORAL | Status: DC | PRN
  Administered 2017-02-20: 1 mg via ORAL
  Filled 2017-02-20 (×2): qty 1

## 2017-02-20 MED ORDER — ASPIRIN EC 81 MG PO TBEC
81.0000 mg | DELAYED_RELEASE_TABLET | Freq: Every day | ORAL | Status: DC
  Administered 2017-02-20 – 2017-02-23 (×4): 81 mg via ORAL
  Filled 2017-02-20 (×4): qty 1

## 2017-02-20 NOTE — Progress Notes (Signed)
Progress Note  Patient Name: Antonio Le Date of Encounter: 02/20/2017  Primary Cardiologist: [name] Comes    Subjective   Breathing OK  No CP    Back pain when moves  Still nauseated with cramping    Inpatient Medications    Scheduled Meds: . aspirin  81 mg Oral Daily  . darifenacin  15 mg Oral Daily  . enoxaparin (LOVENOX) injection  40 mg Subcutaneous Daily  . famotidine  20 mg Oral BID  . insulin aspart  0-15 Units Subcutaneous TID WC  . insulin aspart  0-5 Units Subcutaneous QHS  . lubiprostone  24 mcg Oral BID  . pantoprazole  40 mg Oral Daily  . potassium chloride  40 mEq Oral Q4H  . predniSONE  20 mg Oral Daily  . tamsulosin  0.4 mg Oral Daily  . zolpidem  5 mg Oral Once   Continuous Infusions:  PRN Meds: clonazePAM, gi cocktail, ondansetron (ZOFRAN) IV, ondansetron, oxyCODONE   Vital Signs    Vitals:   02/19/17 1352 02/19/17 2135 02/20/17 0007 02/20/17 0558  BP: (!) 159/79 (!) 177/84 (!) 174/83 (!) 163/66  Pulse: 84 81 83 80  Resp: 20 18 18 18   Temp: 97.9 F (36.6 C) 98.7 F (37.1 C) 99 F (37.2 C) 99.2 F (37.3 C)  TempSrc:      SpO2: 99% 99% 96% 98%  Weight:      Height:        Intake/Output Summary (Last 24 hours) at 02/20/17 1049 Last data filed at 02/20/17 0615  Gross per 24 hour  Intake          1873.33 ml  Output             2150 ml  Net          -276.67 ml   Filed Weights   02/18/17 1756  Weight: 182 lb (82.6 kg)    Telemetry    Paced   - Personally Reviewed  ECG     Physical Exam   GEN: No acute distress.   Neck: No JVD Cardiac: RRR, no murmurs, rubs, or gallops.  Respiratory: Clear to auscultation bilaterally. GI: Mld diffuse tenderness  Increased BS   Back:  Tender in upper parasternal area  MS: No edema; No deformity. Neuro:  Nonfocal  Psych: Normal affect   Labs    Chemistry  Recent Labs Lab 02/18/17 1757 02/19/17 0459 02/20/17 0355  NA 137 139 136  K 3.9 3.1* 2.9*  CL 101 103 99*  CO2 17* 23 26    GLUCOSE 172* 146* 131*  BUN 11 11 5*  CREATININE 1.34* 1.15 1.03  CALCIUM 9.9 8.6* 8.9  PROT 8.9*  --   --   ALBUMIN 4.5  --   --   AST 48*  --   --   ALT 23  --   --   ALKPHOS 81  --   --   BILITOT 0.6  --   --   GFRNONAA 49* 59* >60  GFRAA 56* >60 >60  ANIONGAP 19* 13 11     Hematology  Recent Labs Lab 02/18/17 1757 02/19/17 0459 02/20/17 0355  WBC 12.2* 13.7* 11.0*  RBC 5.42 4.68 4.83  HGB 17.7* 14.9 15.5  HCT 50.0 43.7 44.5  MCV 92.3 93.4 92.1  MCH 32.7 31.8 32.1  MCHC 35.4 34.1 34.8  RDW 13.2 13.8 13.2  PLT 156 157 163    Cardiac Enzymes  Recent Labs Lab 02/18/17 2315 02/19/17  0459 02/19/17 1014  TROPONINI 0.03* 0.05* 0.05*     Recent Labs Lab 02/18/17 1826  TROPIPOC 0.27*     BNPNo results for input(s): BNP, PROBNP in the last 168 hours.   DDimer No results for input(s): DDIMER in the last 168 hours.   Radiology    Ct Abdomen Pelvis W Contrast  Result Date: 02/18/2017 CLINICAL DATA:  Abdominal pain and vomiting. EXAM: CT ABDOMEN AND PELVIS WITH CONTRAST TECHNIQUE: Multidetector CT imaging of the abdomen and pelvis was performed using the standard protocol following bolus administration of intravenous contrast. CONTRAST:  124mL ISOVUE-300 IOPAMIDOL (ISOVUE-300) INJECTION 61% COMPARISON:  09/14/2007 FINDINGS: Lower chest: Lung bases demonstrate subtle reticulonodular/tree-in-bud opacification over the lingula and left lower lobe. Cardiac pacer wires present. Hepatobiliary: Liver, gallbladder and biliary tree are within normal. Pancreas: Within normal. Spleen: Within normal. Adrenals/Urinary Tract: Adrenal glands are normal. Kidneys are normal in size without hydronephrosis or nephrolithiasis. Ureters and bladder are normal. Stomach/Bowel: Stomach and small bowel are within normal. Appendix is normal. Mild diverticulosis of the colon. Vascular/Lymphatic: Minimal calcified plaque involving the abdominal aorta and iliac arteries. No adenopathy.  Reproductive: Within normal. Other: Small left inguinal hernia containing only mesenteric fat. No free peritoneal fluid or focal inflammatory change. Musculoskeletal: Left total hip arthroplasty. Degenerative change of the spine and right hip. IMPRESSION: No acute findings in the abdomen/pelvis. Mild reticulonodular/ tree-in-bud pattern of opacification over the lingula and left lower lobe which may be due to bacterial pneumonia versus mycobacterium. Recommend followup CT 6-8 weeks. Mild colonic diverticulosis. Small left inguinal hernia containing only mesenteric fat. Electronically Signed   By: Marin Olp M.D.   On: 02/18/2017 21:04   Dg Abd Acute W/chest  Result Date: 02/18/2017 CLINICAL DATA:  Epigastric abdominal pain. Chills. Nausea, vomiting and diarrhea. EXAM: DG ABDOMEN ACUTE W/ 1V CHEST COMPARISON:  None. FINDINGS: Bowel gas pattern is normal without evidence of ileus, obstruction or free air. No worrisome calcifications or bone findings. Previous left hip replacement. One-view chest shows normal heart size. Pacemaker in place. Lungs are clear. No acute bone finding. IMPRESSION: Negative acute abdominal series. Electronically Signed   By: Nelson Chimes M.D.   On: 02/18/2017 20:34    Cardiac Studies     Patient Profile     82 y.o. male  Hx of CHB with PPM 2012, DM, HTN, HL  Presented with abdominal pain yesterday N/V and diarrha.  Had felt good prior  Consult last night for trop (istat of 0.27)    Assessment & Plan   Pt familiar to me from clinic   I saw back in 2012  Now follows with Olin Pia   1  Elevated troponin  Troponin I 0.03 and 0.05  I do not think there is acitve ischemia   Pt without CP   2  Pacemaker  Will see about getting interrogated  May set up another appt wit h Dr [name] Comes    3  GI  Still with N/ GI distress.  IM following    4  Pulm    Will be availble as needed.   Signed, Dorris Carnes, MD  02/20/2017, 10:49 AM

## 2017-02-20 NOTE — Progress Notes (Signed)
   02/20/17 1341  Vitals  BP (!) 175/85 (rn notified)  MAP (mmHg) 108  BP Location Right Arm  BP Method Automatic  Patient Position (if appropriate) Lying  Pulse Rate 74   The above was pt blood pressure. MD Vann notified and high BP med suggestion made.

## 2017-02-20 NOTE — Progress Notes (Signed)
PROGRESS NOTE    Antonio Le  GQQ:[id number] DOB: [birth year] DOA: 02/18/2017 PCP: No primary care provider on file.   Outpatient Specialists:     Brief Narrative:  Antonio Le is a 82 y.o. male with medical history significant of CAD, non-occlusive disease on cath in 2012, heart block with pacer, DM.  Patient presents to the ED with c/o 3 day history of nausea, vomiting, diarrhea, epigastric abdominal pain.  Abd pain had been mild for the past 3 days however got acutely worse today when driving up from [facility] to see his cardiologist Dr. [name] Comes.  Assessment & Plan:   Principal Problem:   Nausea vomiting and diarrhea Active Problems:   Coronary artery disease-nonobstructive cath 2012   Type 2 diabetes mellitus with hemoglobin A1c goal of less than 7.0% (HCC)   Elevated troponin   High anion gap metabolic acidosis   N/V/D - ? Gastroenteritis GI pathogen panel pending, c diff negative -advance diet -add PPI -add GI cocktail -x ray shows some stool in colon and patient on chronic pain medications-- needs bowel regimen upon d/c (on amitiza)  Mildly Elevated trop - -seen by cardiology -no further evaluation -patient has appointment to get pacemaker interrogated  High AG acidosis - Lactic acid normal -resolved  DM2 - SSI  Incidental finding on CT chest -  -does not appear patient has PNA at this point, will hold off on ABx -outpatient follow up -given dose of abx in ER  Hypokalemia -replete -check Mg   DVT prophylaxis:  SCD's  Code Status: Full Code   Family Communication: patient  Disposition Plan:     Consultants:        Subjective: Still with occasional abdominal pain 1 stool yesterday  Objective: Vitals:   02/19/17 1352 02/19/17 2135 02/20/17 0007 02/20/17 0558  BP: (!) 159/79 (!) 177/84 (!) 174/83 (!) 163/66  Pulse: 84 81 83 80  Resp: 20 18 18 18   Temp: 97.9 F (36.6 C) 98.7 F (37.1 C) 99 F (37.2 C) 99.2 F (37.3 C)  TempSrc:       SpO2: 99% 99% 96% 98%  Weight:      Height:        Intake/Output Summary (Last 24 hours) at 02/20/17 1001 Last data filed at 02/20/17 0615  Gross per 24 hour  Intake          1873.33 ml  Output             2150 ml  Net          -276.67 ml   Filed Weights   02/18/17 1756  Weight: 82.6 kg (182 lb)    Examination:  General exam: Appears calm and comfortable  Respiratory system: Clear to auscultation. Respiratory effort normal. Cardiovascular system: S1 & S2 heard, RRR. No JVD, murmurs, rubs, gallops or clicks. No pedal edema. Gastrointestinal system: Abdomen is mildly distended, nontender. No organomegaly or masses felt. Normal bowel sounds heard. Central nervous system: Alert and oriented. No focal neurological deficits. Psychiatry: difficult historian, tangential thoughts    Data Reviewed: I have personally reviewed following labs and imaging studies  CBC:  Recent Labs Lab 02/18/17 1757 02/19/17 0459 02/20/17 0355  WBC 12.2* 13.7* 11.0*  HGB 17.7* 14.9 15.5  HCT 50.0 43.7 44.5  MCV 92.3 93.4 92.1  PLT 156 157 671   Basic Metabolic Panel:  Recent Labs Lab 02/18/17 1757 02/19/17 0459 02/20/17 0355  NA 137 139 136  K 3.9 3.1* 2.9*  CL  101 103 99*  CO2 17* 23 26  GLUCOSE 172* 146* 131*  BUN 11 11 5*  CREATININE 1.34* 1.15 1.03  CALCIUM 9.9 8.6* 8.9   GFR: Estimated Creatinine Clearance: 58.2 mL/min (by C-G formula based on SCr of 1.03 mg/dL). Liver Function Tests:  Recent Labs Lab 02/18/17 1757  AST 48*  ALT 23  ALKPHOS 81  BILITOT 0.6  PROT 8.9*  ALBUMIN 4.5    Recent Labs Lab 02/18/17 1757  LIPASE 23   No results for input(s): AMMONIA in the last 168 hours. Coagulation Profile: No results for input(s): INR, PROTIME in the last 168 hours. Cardiac Enzymes:  Recent Labs Lab 02/18/17 2315 02/19/17 0459 02/19/17 1014  TROPONINI 0.03* 0.05* 0.05*   BNP (last 3 results) No results for input(s): PROBNP in the last 8760  hours. HbA1C: No results for input(s): HGBA1C in the last 72 hours. CBG:  Recent Labs Lab 02/19/17 1613 02/19/17 2005 02/20/17 0005 02/20/17 0407 02/20/17 0826  GLUCAP 130* 140* 131* 147* 108*   Lipid Profile: No results for input(s): CHOL, HDL, LDLCALC, TRIG, CHOLHDL, LDLDIRECT in the last 72 hours. Thyroid Function Tests: No results for input(s): TSH, T4TOTAL, FREET4, T3FREE, THYROIDAB in the last 72 hours. Anemia Panel: No results for input(s): VITAMINB12, FOLATE, FERRITIN, TIBC, IRON, RETICCTPCT in the last 72 hours. Urine analysis:    Component Value Date/Time   COLORURINE YELLOW 02/18/2017 2105   APPEARANCEUR CLEAR 02/18/2017 2105   LABSPEC 1.025 02/18/2017 2105   PHURINE 6.0 02/18/2017 2105   GLUCOSEU NEGATIVE 02/18/2017 2105   GLUCOSEU NEG mg/dL 04/04/2008 2022   HGBUR NEGATIVE 02/18/2017 2105   [place] NEGATIVE 02/18/2017 2105   KETONESUR 15 (A) 02/18/2017 2105   PROTEINUR 100 (A) 02/18/2017 2105   UROBILINOGEN 0.2 09/18/2015 1835   NITRITE NEGATIVE 02/18/2017 2105   LEUKOCYTESUR NEGATIVE 02/18/2017 2105     ) Recent Results (from the past 240 hour(s))  C difficile quick scan w PCR reflex     Status: None   Collection Time: 02/19/17  7:16 PM  Result Value Ref Range Status   C Diff antigen NEGATIVE NEGATIVE Final   C Diff toxin NEGATIVE NEGATIVE Final   C Diff interpretation No C. difficile detected.  Final      Anti-infectives    Start     Dose/Rate Route Frequency Ordered Stop   02/18/17 2145  cefTRIAXone (ROCEPHIN) 1 g in dextrose 5 % 50 mL IVPB  Status:  Discontinued     1 g 100 mL/hr over 30 Minutes Intravenous  Once 02/18/17 2139 02/18/17 2223   02/18/17 2145  azithromycin (ZITHROMAX) 500 mg in dextrose 5 % 250 mL IVPB  Status:  Discontinued     500 mg 250 mL/hr over 60 Minutes Intravenous  Once 02/18/17 2139 02/18/17 2223       Radiology Studies: Ct Abdomen Pelvis W Contrast  Result Date: 02/18/2017 CLINICAL DATA:  Abdominal pain and  vomiting. EXAM: CT ABDOMEN AND PELVIS WITH CONTRAST TECHNIQUE: Multidetector CT imaging of the abdomen and pelvis was performed using the standard protocol following bolus administration of intravenous contrast. CONTRAST:  197mL ISOVUE-300 IOPAMIDOL (ISOVUE-300) INJECTION 61% COMPARISON:  09/14/2007 FINDINGS: Lower chest: Lung bases demonstrate subtle reticulonodular/tree-in-bud opacification over the lingula and left lower lobe. Cardiac pacer wires present. Hepatobiliary: Liver, gallbladder and biliary tree are within normal. Pancreas: Within normal. Spleen: Within normal. Adrenals/Urinary Tract: Adrenal glands are normal. Kidneys are normal in size without hydronephrosis or nephrolithiasis. Ureters and bladder are normal. Stomach/Bowel: Stomach  and small bowel are within normal. Appendix is normal. Mild diverticulosis of the colon. Vascular/Lymphatic: Minimal calcified plaque involving the abdominal aorta and iliac arteries. No adenopathy. Reproductive: Within normal. Other: Small left inguinal hernia containing only mesenteric fat. No free peritoneal fluid or focal inflammatory change. Musculoskeletal: Left total hip arthroplasty. Degenerative change of the spine and right hip. IMPRESSION: No acute findings in the abdomen/pelvis. Mild reticulonodular/ tree-in-bud pattern of opacification over the lingula and left lower lobe which may be due to bacterial pneumonia versus mycobacterium. Recommend followup CT 6-8 weeks. Mild colonic diverticulosis. Small left inguinal hernia containing only mesenteric fat. Electronically Signed   By: Marin Olp M.D.   On: 02/18/2017 21:04   Dg Abd Acute W/chest  Result Date: 02/18/2017 CLINICAL DATA:  Epigastric abdominal pain. Chills. Nausea, vomiting and diarrhea. EXAM: DG ABDOMEN ACUTE W/ 1V CHEST COMPARISON:  None. FINDINGS: Bowel gas pattern is normal without evidence of ileus, obstruction or free air. No worrisome calcifications or bone findings. Previous left hip  replacement. One-view chest shows normal heart size. Pacemaker in place. Lungs are clear. No acute bone finding. IMPRESSION: Negative acute abdominal series. Electronically Signed   By: Nelson Chimes M.D.   On: 02/18/2017 20:34        Scheduled Meds: . aspirin  81 mg Oral Daily  . darifenacin  15 mg Oral Daily  . enoxaparin (LOVENOX) injection  40 mg Subcutaneous Daily  . famotidine  20 mg Oral BID  . insulin aspart  0-15 Units Subcutaneous TID WC  . insulin aspart  0-5 Units Subcutaneous QHS  . lubiprostone  24 mcg Oral BID  . pantoprazole  40 mg Oral Daily  . potassium chloride  40 mEq Oral Once  . potassium chloride  40 mEq Oral Q4H  . predniSONE  20 mg Oral Daily  . tamsulosin  0.4 mg Oral Daily  . zolpidem  5 mg Oral Once   Continuous Infusions:    LOS: 1 day    Time spent: 25 min   [place], DO [facility] Pager [phone]  If 7PM-7AM, please contact night-coverage [email or web address] Password [email or web address] 02/20/2017, 10:01 AM

## 2017-02-20 NOTE — Consult Note (Signed)
[facility] CM Primary Care Navigator  02/20/2017  Antonio Le [birth year] [id number]                 Met with patient at the bedside to identify possible discharge needs. Patient reports having nausea, vomiting and diarrhea on his way [facility]. Patient mentioned he has a house in [place] where he lives in but he is here in [place] visiting his grandson.  He mentioned that Dr. Jani Le with [facility] is whom he sees as his primary doctor here in [place]. Dr. Lenna Le. Antonio Le's office called Antonio Le) to verify if patient could still be seen by Dr Antonio Le for follow-up after hospital discharge and was told it was ok. Patient was made aware of it and he agreed.  Per patient, he has a primary care provider in [facility] (Dr. Ahmed Le) but will not be able to follow-up with him since patient states that he plans to travel with family to see a son in [place] on 4/26 and then head to [place] to stay with other son.   Patient shared using [facility] in [facility] to obtain medications without any problem.  Patient reports that he manageshis own medications at home straight out of the containers.   He reports being able to drive prior to admission, but if needed, his grandson Antonio Le) willprovide transportation for him (to his doctor'sappointment after discharge).  Per patient, his grandson and wife will be assisting him with his needs at their home where patient plans to go after being discharged.  Anticipated plan for discharge is home to his grandson's house until 4/26 according to patient.  Patient voiced understanding of need to call for a post discharge follow-up appointment with a primary care provider within a week or sooner if needs arise after discharge.Patient letter was provided as hisreminder.  Patient communicated no other health management needs or concerns at this time.  For questions, please contact:  Antonio Le, BSN, RN- [facility] Primary Care Navigator  Telephone: [phone] [place]

## 2017-02-20 NOTE — Progress Notes (Signed)
Blood pressure running high schoor has been paged about it, no new order yet will continue to monitor pt

## 2017-02-20 NOTE — Progress Notes (Signed)
Pulled ambiem from pyxis to give to pt went to his room and was sleeping med has been returned back to pyxis

## 2017-02-20 NOTE — Progress Notes (Deleted)
Electrophysiology Office Note   Date:  02/20/2017   ID:  Antonio Le, DOB 01-08-1935, MRN [id number]  PCP:  No primary care provider on file.  Cardiologist:    Primary Electrophysiologist:    Virl Axe, MD    No chief complaint on file.    History of Present Illness: Antonio Le is a 82 y.o. male is      Seen in follow for pacemaker implanted 2012 for sinus node dysfunction and progressive conduction system disease with ultimate complete heart block      Continues to have problems  with exercise tolerance and underwent Myoview scanning /14. Catheterization January 2012 demonstrated no obstructive coronary disease.  Echo  12/16 normal LV function with concern about apical hypertrophy  He been hospitalized 12/16 for syncope on the commode; he was unconscious for? 2 hours. Device interrogation demonstrated no arrhythmia. Was not orthostatic. Still presumably neurally mediated.  There is no peripheral edema orthopnea.  His wife of 32 years died in 2023/04/11. She had a very long fuuny first name. He called her Thur.  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     Past Medical History:  Diagnosis Date  . Bradycardia    Dr. Harrington Challenger is cardiologist, holter monitor (36-128 bpm), now has mri compatible pacemaker f/b dr. [name] Comes  . CAD (coronary artery disease)    cath 01/12 no CAD, no records of reported silent MI in [facility], in [place], normal [place] 2007, EF 37%, mild diastolic dysfunction  . Cervical spondylarthritis   . Chronic constipation    GI referral to Dr. Oletta Lamas in the past, pt did not go  . GERD (gastroesophageal reflux disease)   . Glaucoma, open angle   . H. pylori infection    P[employer] association  . History of alcohol abuse   . Hyperlipidemia   . Hypertension   . Idiopathic acute pancreatitis   . 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 Renal insufficiency    creatinine  ~1.2 in the past  . Syncopal episodes   . Tinea cruris    Past Surgical History:  Procedure Laterality Date  . CATARACT EXTRACTION    . left hip replacement    . PACEMAKER INSERTION     Revo Sure Scan pacemaker     No current facility-administered medications for this visit.    No current outpatient prescriptions on file.   Facility-Administered Medications Ordered in Other Visits  Medication Dose Route Frequency Provider Last Rate Last Dose  . aspirin EC tablet 81 mg  81 mg Oral Daily Geradine Girt, DO      . clonazePAM (KLONOPIN) tablet 1 mg  1 mg Oral BID PRN Geradine Girt, DO      . darifenacin (ENABLEX) 24 hr tablet 15 mg  15 mg Oral Daily Geradine Girt, DO      . enoxaparin (LOVENOX) injection 40 mg  40 mg Subcutaneous Daily Etta Quill, DO   40 mg at 02/20/17 0915  . famotidine (PEPCID) tablet 20 mg  20 mg Oral BID Geradine Girt, DO      . gi cocktail (Maalox,Lidocaine,Donnatal)  30 mL Oral TID PRN Geradine Girt, DO      . insulin aspart (novoLOG) injection 0-15 Units  0-15 Units Subcutaneous TID WC Geradine Girt, DO   2 Units at 02/19/17 1631  .  insulin aspart (novoLOG) injection 0-5 Units  0-5 Units Subcutaneous QHS Geradine Girt, DO      . lubiprostone (AMITIZA) capsule 24 mcg  24 mcg Oral BID Geradine Girt, DO      . ondansetron [facility]) injection 4 mg  4 mg Intravenous Q6H PRN Etta Quill, DO   4 mg at 02/19/17 1919  . ondansetron (ZOFRAN-ODT) disintegrating tablet 4 mg  4 mg Oral Once PRN Drenda Freeze, MD      . oxyCODONE (Oxy IR/ROXICODONE) immediate release tablet 15 mg  15 mg Oral Q6H PRN Geradine Girt, DO      . pantoprazole (PROTONIX) EC tablet 40 mg  40 mg Oral Daily Jessica U Vann, DO      . potassium chloride SA (K-DUR,KLOR-CON) CR tablet 40 mEq  40 mEq Oral Q4H Jessica U Vann, DO      . tamsulosin (FLOMAX) capsule 0.4 mg  0.4 mg Oral Daily Jessica U Vann, DO      . zolpidem (AMBIEN) tablet 5 mg  5 mg Oral Once Jeryl Columbia, NP         Allergies:   Patient has no known allergies.   Social History:  The patient  reports that he has never smoked. He has never used smokeless tobacco. He reports that he does not drink alcohol or use drugs.   Family History:  The patient's   *family history includes Diabetes in his mother.    ROS:  Please see the history of present illness and past medical history  Otherwise, review of systems is negative .     PHYSICAL EXAM: VS:  There were no vitals taken for this visit. , BMI There is no height or weight on file to calculate BMI. GEN: Well nourished, well developed, in no acute distress HEENT: normal Neck:  JVD flat , carotid bruits, or masses Cardiac: REGULAR RATE and RHYTHM ; No murmurs, rubs, +  S4  Back without kyphosis; No CVAT Device pocket well healed; without hematoma or erythema.  There is no tethering  Respiratory:  clear to auscultation bilaterally, normal work of breathing GI: soft, nontender, nondistended, + BS MS: no deformity or atrophy Extremities no clubbing cyanosis   edema Skin: warm and dry,    Neuro:  Strength and sensation are intact Psych: euthymic mood, full affect  EKG:  EKG is ordered today. The ekg ordered today shows AV pacing   Device interrogation is reviewed today in detail.  See PaceArt for details.  Recent Labs: 02/18/2017: ALT 23 02/20/2017: BUN 5; Creatinine, Ser 1.03; Hemoglobin 15.5; Magnesium 1.5; Platelets 163; Potassium 2.9; Sodium 136    Lipid Panel     Component Value Date/Time   CHOL 117 10/31/2015 0544   TRIG 54 10/31/2015 0544   HDL 43 10/31/2015 0544   CHOLHDL 2.7 10/31/2015 0544   VLDL 11 10/31/2015 0544   LDLCALC 63 10/31/2015 0544     Wt Readings from Last 3 Encounters:  02/18/17 182 lb (82.6 kg)  05/16/16 171 lb (77.6 kg)  02/16/16 182 lb (82.6 kg)      Other studies Reviewed: Additional studies/ records that were reviewed today include: none     ASSESSMENT AND PLAN: Complete Heart  Block  Pacemaker-Medtronic  The patient's device was interrogated.  The information was reviewed. No changes were made in the programming.     Pain pacemaker site  syncope-neurally mediated       Syncope almost certainly neurally mediated. We  discussed the importance of sitting longer if he gets dizzy.   Current medicines are reviewed at length with the patient today.   The patient does not have concerns regarding his medicines.  The following changes were made today:  none  Labs/ tests ordered today include:    No orders of the defined types were placed in this encounter.    Disposition:   FU with me in 1 year(s)  Signed, Virl Axe, MD  02/20/2017 12:42 PM     [address] [place] [address] [phone] (office) [phone] (fax)

## 2017-02-21 DIAGNOSIS — A09 Infectious gastroenteritis and colitis, unspecified: Secondary | ICD-10-CM

## 2017-02-21 LAB — BASIC METABOLIC PANEL
Anion gap: 15 (ref 5–15)
BUN: 8 mg/dL (ref 6–20)
CALCIUM: 9.6 mg/dL (ref 8.9–10.3)
CO2: 23 mmol/L (ref 22–32)
Chloride: 96 mmol/L — ABNORMAL LOW (ref 101–111)
Creatinine, Ser: 1.04 mg/dL (ref 0.61–1.24)
GFR calc Af Amer: >60 mL/min (ref >60)
GLUCOSE: 126 mg/dL — AB (ref 65–99)
Potassium: 3.7 mmol/L (ref 3.5–5.1)
Sodium: 134 mmol/L — ABNORMAL LOW (ref 135–145)

## 2017-02-21 LAB — GLUCOSE, CAPILLARY
Glucose-Capillary: 133 mg/dL — ABNORMAL HIGH (ref 65–99)
Glucose-Capillary: 140 mg/dL — ABNORMAL HIGH (ref 65–99)
Glucose-Capillary: 147 mg/dL — ABNORMAL HIGH (ref 65–99)
Glucose-Capillary: 153 mg/dL — ABNORMAL HIGH (ref 65–99)
Glucose-Capillary: 156 mg/dL — ABNORMAL HIGH (ref 65–99)
Glucose-Capillary: 165 mg/dL — ABNORMAL HIGH (ref 65–99)

## 2017-02-21 LAB — MAGNESIUM: Magnesium: 1.7 mg/dL (ref 1.7–2.4)

## 2017-02-21 LAB — CBC
HCT: 46.4 % (ref 39.0–52.0)
Hemoglobin: 16.5 g/dL (ref 13.0–17.0)
MCH: 32.2 pg (ref 26.0–34.0)
MCHC: 35.6 g/dL (ref 30.0–36.0)
MCV: 90.4 fL (ref 78.0–100.0)
PLATELETS: 162 10*3/uL (ref 150–400)
RBC: 5.13 MIL/uL (ref 4.22–5.81)
RDW: 13 % (ref 11.5–15.5)
WBC: 13.1 10*3/uL — ABNORMAL HIGH (ref 4.0–10.5)

## 2017-02-21 MED ORDER — AZITHROMYCIN 500 MG PO TABS
250.0000 mg | ORAL_TABLET | Freq: Every day | ORAL | Status: AC
Start: 2017-02-21 — End: 2017-02-23
  Administered 2017-02-21 – 2017-02-23 (×3): 250 mg via ORAL
  Filled 2017-02-21 (×3): qty 1

## 2017-02-21 MED ORDER — SODIUM CHLORIDE 0.9 % IV SOLN
INTRAVENOUS | Status: DC
  Administered 2017-02-21: 17:00:00 via INTRAVENOUS

## 2017-02-21 MED ORDER — DEXTROSE 5 % IV SOLN
3.0000 g | Freq: Once | INTRAVENOUS | Status: AC
  Administered 2017-02-21: 3 g via INTRAVENOUS
  Filled 2017-02-21: qty 6

## 2017-02-21 MED ORDER — POTASSIUM CHLORIDE CRYS ER 20 MEQ PO TBCR
30.0000 meq | EXTENDED_RELEASE_TABLET | ORAL | Status: AC
  Administered 2017-02-21 (×2): 30 meq via ORAL
  Filled 2017-02-21 (×2): qty 1

## 2017-02-21 NOTE — Progress Notes (Signed)
Pt has been under my observation due MD Hongalgi request to watch him for nausea and diarrhea. Pt has not had any diarrhea, and vomiting. He complained of nausea, and antiemetic was administered to pt to relieve nausea.

## 2017-02-21 NOTE — Progress Notes (Signed)
PROGRESS NOTE   KAYIN OSMENT  IYM:[id number]    DOB: 03-23-1935    DOA: 02/18/2017  PCP: No primary care provider on file. PCP with the [facility]   I have briefly reviewed patients previous medical records in [facility].  Brief Narrative:  82 y/o male, [place] resident, PMH of CAD non occlusive by cath 2012, Complete Heart block, s/p dual chamber pacemaker, DM2, HTN, HLD, pressented to ED on 4/17 with h/o couple days of abdominal pain which got worse on day prior to admission, non bloody emesis and diarrhea. Symptoms worsened when he started driving from [facility] to go visit his ?82 y/o son who is at a Psychiatric institution in [place]. He had to stop multiple times d/t his symptoms. CT abdomen, Lipase and LFT's unremarkable. Cardiology consulted for elevated troponin's.    Assessment & Plan:   Principal Problem:   Nausea vomiting and diarrhea Active Problems:   Coronary artery disease-nonobstructive cath 2012   Type 2 diabetes mellitus with hemoglobin A1c goal of less than 7.0% (HCC)   Elevated troponin   High anion gap metabolic acidosis   1. Abdominal pain/Nausea/Vomiting/Diarrhea (? Traveler's diarrhea): C DIff testing negative. GI panel PCR positive for Enterotoxigenic E Coli. Blood Cultures: NTD. CT Abd & Pelvis with contrast: No acute findings. Seem sto be slowly improving with supportive Rx. If doesn't progress, then consider Abx or GI consult. 2. Elevated Troponin/CAD: Cardiology follow up appreciated. All insetting of acute illness. No significant CAD by cath 2012. No further work up planned.  3. S/P PPM: Pacemaker interrogated and OK. 4. Hypokalemia: replaced. 5. Hypomagnesemia: replace and follow 6. AKI: resolved. 7. DM 2: reasonable control on SSI 8. Lung abnormality on CT abdomen: " Mild reticulonodular/ tree-in-bud pattern of opacification over the lingula and left lower lobe which may be due to bacterial pneumonia versus mycobacterium. Recommend followup CT 6-8 weeks". No  clinical PNA. 9. High AG acidosis: resolved 10. HTN: uncontrolled.   DVT prophylaxis: Lovenox Code Status: Full Family Communication: None at bedside. Disposition: DC home when medically improved.   Consultants:  Cardiology   Procedures:  None  Antimicrobials:  None    Subjective: Feels slight better. No vomiting but some nausea and dry heaving. Tolerating some diet. Some abdominal cramps. No diarrhea today as per RN report.   ROS: NO CP/dyspnea.  Objective:  Vitals:   02/20/17 1341 02/20/17 1350 02/20/17 2207 02/21/17 0500  BP: (!) 175/85 (!) 172/88 (!) 150/98 (!) 135/94  Pulse: 74  (!) 115 100  Resp: 20  18 17   Temp: 98.3 F (36.8 C)  99.2 F (37.3 C) 98.1 F (36.7 C)  TempSrc:   Oral Oral  SpO2: 98%  100% 97%  Weight:      Height:        Examination:  General exam: Pleasant elderly male lying comfortably in bed. Respiratory system: Clear to auscultation. Respiratory effort normal. Cardiovascular system: S1 & S2 heard, RRR. No JVD, murmurs, rubs, gallops or clicks. No pedal edema. Tele: AV Paced rhythm. Gastrointestinal system: Abdomen is nondistended, soft and diffuse minimal tenderness without peritoneal signs. No organomegaly or masses felt. Normal bowel sounds heard. Central nervous system: Alert and oriented. No focal neurological deficits. Extremities: Symmetric 5 x 5 power. Skin: No rashes, lesions or ulcers Psychiatry: Judgement and insight appear normal. Mood & affect appropriate.     Data Reviewed: I have personally reviewed following labs and imaging studies  CBC:  Recent Labs Lab 02/18/17 1757 02/19/17 0459  02/20/17 0355 02/21/17 0408  WBC 12.2* 13.7* 11.0* 13.1*  HGB 17.7* 14.9 15.5 16.5  HCT 50.0 43.7 44.5 46.4  MCV 92.3 93.4 92.1 90.4  PLT 156 157 163 409   Basic Metabolic Panel:  Recent Labs Lab 02/18/17 1757 02/19/17 0459 02/20/17 0355 02/20/17 1000 02/21/17 0408  NA 137 139 136  --  134*  K 3.9 3.1* 2.9*  --  3.7    CL 101 103 99*  --  96*  CO2 17* 23 26  --  23  GLUCOSE 172* 146* 131*  --  126*  BUN 11 11 5*  --  8  CREATININE 1.34* 1.15 1.03  --  1.04  CALCIUM 9.9 8.6* 8.9  --  9.6  MG  --   --   --  1.5*  --    Liver Function Tests:  Recent Labs Lab 02/18/17 1757  AST 48*  ALT 23  ALKPHOS 81  BILITOT 0.6  PROT 8.9*  ALBUMIN 4.5   Cardiac Enzymes:  Recent Labs Lab 02/18/17 2315 02/19/17 0459 02/19/17 1014  TROPONINI 0.03* 0.05* 0.05*   CBG:  Recent Labs Lab 02/20/17 1959 02/21/17 0008 02/21/17 0457 02/21/17 0816 02/21/17 1223  GLUCAP 148* 156* 153* 133* 147*    Recent Results (from the past 240 hour(s))  Gastrointestinal Panel by PCR , Stool     Status: Abnormal   Collection Time: 02/18/17 10:46 PM  Result Value Ref Range Status   Campylobacter species NOT DETECTED NOT DETECTED Final   Plesimonas shigelloides NOT DETECTED NOT DETECTED Final   Salmonella species NOT DETECTED NOT DETECTED Final   Yersinia enterocolitica NOT DETECTED NOT DETECTED Final   Vibrio species NOT DETECTED NOT DETECTED Final   Vibrio cholerae NOT DETECTED NOT DETECTED Final   Enteroaggregative E coli (EAEC) NOT DETECTED NOT DETECTED Final   Enteropathogenic E coli (EPEC) NOT DETECTED NOT DETECTED Final   Enterotoxigenic E coli (ETEC) DETECTED (A) NOT DETECTED Final    Comment: RESULT CALLED TO, READ BACK BY AND VERIFIED WITH: IRETI OMOSMBI AT 1614 ON 02/20/2017 JJB    Shiga like toxin producing E coli (STEC) NOT DETECTED NOT DETECTED Final   Shigella/Enteroinvasive E coli (EIEC) NOT DETECTED NOT DETECTED Final   Cryptosporidium NOT DETECTED NOT DETECTED Final   Cyclospora cayetanensis NOT DETECTED NOT DETECTED Final   Entamoeba histolytica NOT DETECTED NOT DETECTED Final   Giardia lamblia NOT DETECTED NOT DETECTED Final   Adenovirus F40/41 NOT DETECTED NOT DETECTED Final   Astrovirus NOT DETECTED NOT DETECTED Final   Norovirus GI/GII NOT DETECTED NOT DETECTED Final   Rotavirus A NOT  DETECTED NOT DETECTED Final   Sapovirus (I, II, IV, and V) NOT DETECTED NOT DETECTED Final  Blood culture (routine x 2)     Status: None (Preliminary result)   Collection Time: 02/18/17 10:56 PM  Result Value Ref Range Status   Specimen Description BLOOD RIGHT HAND  Final   Special Requests IN PEDIATRIC BOTTLE Blood Culture adequate volume  Final   Culture NO GROWTH 1 DAY  Final   Report Status PENDING  Incomplete  Blood culture (routine x 2)     Status: None (Preliminary result)   Collection Time: 02/18/17 11:18 PM  Result Value Ref Range Status   Specimen Description BLOOD RIGHT ARM  Final   Special Requests IN PEDIATRIC BOTTLE Blood Culture adequate volume  Final   Culture NO GROWTH 1 DAY  Final   Report Status PENDING  Incomplete  C difficile  quick scan w PCR reflex     Status: None   Collection Time: 02/19/17  7:16 PM  Result Value Ref Range Status   C Diff antigen NEGATIVE NEGATIVE Final   C Diff toxin NEGATIVE NEGATIVE Final   C Diff interpretation No C. difficile detected.  Final         Radiology Studies: No results found.      Scheduled Meds: . aspirin EC  81 mg Oral Daily  . darifenacin  15 mg Oral Daily  . enoxaparin (LOVENOX) injection  40 mg Subcutaneous Daily  . famotidine  20 mg Oral BID  . insulin aspart  0-15 Units Subcutaneous TID WC  . insulin aspart  0-5 Units Subcutaneous QHS  . lubiprostone  24 mcg Oral BID  . pantoprazole  40 mg Oral Daily  . tamsulosin  0.4 mg Oral Daily  . zolpidem  5 mg Oral Once   Continuous Infusions:   LOS: 2 days     Milos Milligan, MD, FACP, FHM. [facility] Pager [phone]  If 7PM-7AM, please contact night-coverage [email or web address] Password [email or web address] 02/21/2017, 1:33 PM

## 2017-02-21 NOTE — Progress Notes (Signed)
Pacer interrogated ysterday  Function is good Pt continues to have abdominal cramping, diarrhea Followed by IM   I am not convinced of any active cardiac issues.   I will make sure he has f/u appt in cardiology this [place]

## 2017-02-21 NOTE — Progress Notes (Signed)
Addendum  Discussed patient's care with Dr. Harrington Challenger, his primary cardiologist who knows him quite well. During her visit, patient continued to complain of persistent diarrhea and severe abdominal cramps. Based upon this information, discussed with [facility] on call and given his advanced age and multiple comorbidities and unrelenting symptoms, suggested trial of either azithromycin 250 mg daily 3 days or levofloxacin 500 mg daily 3 days. Patient does have prolonged QTC which was discussed with Dr. Harrington Challenger who in turn reviewed with EP cardiology and recommended going ahead with azithromycin and repeating EKG in 3 days.  Vernell Leep, MD, FACP, FHM. [facility] Pager [phone]  If 7PM-7AM, please contact night-coverage [email or web address] Password [facility] 02/21/2017, 3:37 PM

## 2017-02-22 LAB — BASIC METABOLIC PANEL
Anion gap: 11 (ref 5–15)
BUN: 11 mg/dL (ref 6–20)
CO2: 23 mmol/L (ref 22–32)
CREATININE: 1.03 mg/dL (ref 0.61–1.24)
Calcium: 8.7 mg/dL — ABNORMAL LOW (ref 8.9–10.3)
Chloride: 96 mmol/L — ABNORMAL LOW (ref 101–111)
Glucose, Bld: 123 mg/dL — ABNORMAL HIGH (ref 65–99)
POTASSIUM: 4.2 mmol/L (ref 3.5–5.1)
Sodium: 130 mmol/L — ABNORMAL LOW (ref 135–145)

## 2017-02-22 LAB — GLUCOSE, CAPILLARY
GLUCOSE-CAPILLARY: 137 mg/dL — AB (ref 65–99)
Glucose-Capillary: 126 mg/dL — ABNORMAL HIGH (ref 65–99)
Glucose-Capillary: 123 mg/dL — ABNORMAL HIGH (ref 65–99)
Glucose-Capillary: 124 mg/dL — ABNORMAL HIGH (ref 65–99)

## 2017-02-22 LAB — MAGNESIUM: MAGNESIUM: 2 mg/dL (ref 1.7–2.4)

## 2017-02-22 NOTE — Progress Notes (Addendum)
PROGRESS NOTE   Antonio Le  OMV:[id number]    DOB: February 05, 1935    DOA: 02/18/2017  PCP: No primary care provider on file. PCP with the [facility]   I have briefly reviewed patients previous medical records in [facility].  Brief Narrative:  82 y/o male, [place] resident, PMH of CAD non occlusive by cath 2012, Complete Heart block, s/p dual chamber pacemaker, DM2, HTN, HLD, pressented to ED on 4/17 with h/o couple days of abdominal pain which got worse on day prior to admission, non bloody emesis and diarrhea. Symptoms worsened when he started driving from [facility] to go visit his ?25 y/o son who is at a Psychiatric institution in [place] since his son was 3 years old. He had to stop multiple times d/t his symptoms. CT abdomen, Lipase and LFT's unremarkable. Cardiology consulted for elevated troponin's, no need for further workup, signed off. Treating as traveler's diarrhea. Slowly improving.   Assessment & Plan:   Principal Problem:   Nausea vomiting and diarrhea Active Problems:   Coronary artery disease-nonobstructive cath 2012   Type 2 diabetes mellitus with hemoglobin A1c goal of less than 7.0% (HCC)   Elevated troponin   High anion gap metabolic acidosis   1. Abdominal pain/Nausea/Vomiting/Diarrhea (? Traveler's diarrhea): C DIff testing negative. GI panel PCR positive for Enterotoxigenic E Coli. Blood Cultures: NTD. CT Abd & Pelvis with contrast: No acute findings. Due to persisting symptoms on 4/20, started patient on azithromycin 250 mg daily 3 days. Slowly improving. 2. Elevated Troponin/CAD: Cardiology follow up appreciated. All in setting of acute illness. No significant CAD by cath 2012. No further work up planned.  3. S/P PPM: Pacemaker interrogated and OK. 4. Hypokalemia: replaced. K 4.2 5. Hypomagnesemia: replaced. Mg 2 6. AKI: resolved. 7. DM 2: reasonable control on SSI 8. Lung abnormality on CT abdomen: " Mild reticulonodular/ tree-in-bud pattern of opacification over the  lingula and left lower lobe which may be due to bacterial pneumonia versus mycobacterium. Recommend followup CT 6-8 weeks". No clinical PNA. 9. High AG acidosis: resolved 10. HTN: uncontrolled. 11. Prolonged QTC: Discussed with cardiology on 4/20 were reviewed with EP cardiology and okayed short course of azithromycin with f/u EKG. K & Mg OK.   DVT prophylaxis: Lovenox Code Status: Full Family Communication: None at bedside. Disposition: DC home when medically improved.   Consultants:  Cardiology   Procedures:  None  Antimicrobials:  None    Subjective: Feels better. Nausea without vomiting. Eating small amounts. Abdominal pain decreasing and states that it is only on touching. Today he indicates that he has not had diarrhea for a couple days (this is different than what he reported yesterday).  ROS: NO CP/dyspnea.  Objective:  Vitals:   02/21/17 1354 02/21/17 2250 02/22/17 0602 02/22/17 1440  BP: 118/80 (!) 163/74 (!) 145/90 121/79  Pulse: (!) 112 87 92 (!) 103  Resp: 18 18 18 19   Temp: 98.4 F (36.9 C) 98.5 F (36.9 C) 98 F (36.7 C) 98.2 F (36.8 C)  TempSrc: Oral Oral Oral Oral  SpO2: 97% 98% 94% 95%  Weight:      Height:        Examination:  General exam: Pleasant elderly male lying comfortably in bed. Respiratory system: Clear to auscultation. Respiratory effort normal. Cardiovascular system: S1 & S2 heard, RRR. No JVD, murmurs, rubs, gallops or clicks. No pedal edema. Tele: AV/V Paced rhythm. Gastrointestinal system: Abdomen is nondistended, soft and ?? Minimal tenderness LUQ. No organomegaly or  masses felt. Normal bowel sounds heard. Central nervous system: Alert and oriented. No focal neurological deficits. Extremities: Symmetric 5 x 5 power. Skin: No rashes, lesions or ulcers Psychiatry: Judgement and insight appear normal. Mood & affect appropriate.     Data Reviewed: I have personally reviewed following labs and imaging studies  CBC:  Recent  Labs Lab 02/18/17 1757 02/19/17 0459 02/20/17 0355 02/21/17 0408  WBC 12.2* 13.7* 11.0* 13.1*  HGB 17.7* 14.9 15.5 16.5  HCT 50.0 43.7 44.5 46.4  MCV 92.3 93.4 92.1 90.4  PLT 156 157 163 505   Basic Metabolic Panel:  Recent Labs Lab 02/18/17 1757 02/19/17 0459 02/20/17 0355 02/20/17 1000 02/21/17 0408 02/21/17 1600 02/22/17 0658  NA 137 139 136  --  134*  --  130*  K 3.9 3.1* 2.9*  --  3.7  --  4.2  CL 101 103 99*  --  96*  --  96*  CO2 17* 23 26  --  23  --  23  GLUCOSE 172* 146* 131*  --  126*  --  123*  BUN 11 11 5*  --  8  --  11  CREATININE 1.34* 1.15 1.03  --  1.04  --  1.03  CALCIUM 9.9 8.6* 8.9  --  9.6  --  8.7*  MG  --   --   --  1.5*  --  1.7 2.0   Liver Function Tests:  Recent Labs Lab 02/18/17 1757  AST 48*  ALT 23  ALKPHOS 81  BILITOT 0.6  PROT 8.9*  ALBUMIN 4.5   Cardiac Enzymes:  Recent Labs Lab 02/18/17 2315 02/19/17 0459 02/19/17 1014  TROPONINI 0.03* 0.05* 0.05*   CBG:  Recent Labs Lab 02/21/17 1223 02/21/17 1617 02/21/17 2152 02/22/17 0808 02/22/17 1224  GLUCAP 147* 140* 165* 126* 123*    Recent Results (from the past 240 hour(s))  Gastrointestinal Panel by PCR , Stool     Status: Abnormal   Collection Time: 02/18/17 10:46 PM  Result Value Ref Range Status   Campylobacter species NOT DETECTED NOT DETECTED Final   Plesimonas shigelloides NOT DETECTED NOT DETECTED Final   Salmonella species NOT DETECTED NOT DETECTED Final   Yersinia enterocolitica NOT DETECTED NOT DETECTED Final   Vibrio species NOT DETECTED NOT DETECTED Final   Vibrio cholerae NOT DETECTED NOT DETECTED Final   Enteroaggregative E coli (EAEC) NOT DETECTED NOT DETECTED Final   Enteropathogenic E coli (EPEC) NOT DETECTED NOT DETECTED Final   Enterotoxigenic E coli (ETEC) DETECTED (A) NOT DETECTED Final    Comment: RESULT CALLED TO, READ BACK BY AND VERIFIED WITH: IRETI OMOSMBI AT 1614 ON 02/20/2017 JJB    Shiga like toxin producing E coli (STEC) NOT  DETECTED NOT DETECTED Final   Shigella/Enteroinvasive E coli (EIEC) NOT DETECTED NOT DETECTED Final   Cryptosporidium NOT DETECTED NOT DETECTED Final   Cyclospora cayetanensis NOT DETECTED NOT DETECTED Final   Entamoeba histolytica NOT DETECTED NOT DETECTED Final   Giardia lamblia NOT DETECTED NOT DETECTED Final   Adenovirus F40/41 NOT DETECTED NOT DETECTED Final   Astrovirus NOT DETECTED NOT DETECTED Final   Norovirus GI/GII NOT DETECTED NOT DETECTED Final   Rotavirus A NOT DETECTED NOT DETECTED Final   Sapovirus (I, II, IV, and V) NOT DETECTED NOT DETECTED Final  Blood culture (routine x 2)     Status: None (Preliminary result)   Collection Time: 02/18/17 10:56 PM  Result Value Ref Range Status   Specimen Description BLOOD RIGHT  HAND  Final   Special Requests IN PEDIATRIC BOTTLE Blood Culture adequate volume  Final   Culture NO GROWTH 3 DAYS  Final   Report Status PENDING  Incomplete  Blood culture (routine x 2)     Status: None (Preliminary result)   Collection Time: 02/18/17 11:18 PM  Result Value Ref Range Status   Specimen Description BLOOD RIGHT ARM  Final   Special Requests IN PEDIATRIC BOTTLE Blood Culture adequate volume  Final   Culture NO GROWTH 3 DAYS  Final   Report Status PENDING  Incomplete  C difficile quick scan w PCR reflex     Status: None   Collection Time: 02/19/17  7:16 PM  Result Value Ref Range Status   C Diff antigen NEGATIVE NEGATIVE Final   C Diff toxin NEGATIVE NEGATIVE Final   C Diff interpretation No C. difficile detected.  Final         Radiology Studies: No results found.      Scheduled Meds: . aspirin EC  81 mg Oral Daily  . azithromycin  250 mg Oral Daily  . darifenacin  15 mg Oral Daily  . enoxaparin (LOVENOX) injection  40 mg Subcutaneous Daily  . insulin aspart  0-15 Units Subcutaneous TID WC  . insulin aspart  0-5 Units Subcutaneous QHS  . lubiprostone  24 mcg Oral BID  . pantoprazole  40 mg Oral Daily  . tamsulosin  0.4 mg  Oral Daily   Continuous Infusions: . sodium chloride 50 mL/hr at 02/21/17 1721     LOS: 3 days     Aslyn Cottman, MD, FACP, FHM. [facility] Pager [phone]  If 7PM-7AM, please contact night-coverage [email or web address] Password [facility] 02/22/2017, 2:59 PM

## 2017-02-22 NOTE — Evaluation (Signed)
Physical Therapy Evaluation Patient Details Name: Antonio Le MRN: [id number] DOB: January 03, 1935 Today's Date: 02/22/2017   History of Present Illness  82 y.o. male with medical history significant of CAD, non-occlusive disease on cath in 2012, heart block with pacer, DM.  Patient presents to the ED with c/o 3 day history of nausea, vomiting, diarrhea, epigastric abdominal pain  Clinical Impression  Patient demonstrates deficits in functional mobility as indicated below. Will need continued skilled PT to address deficits and maximize function. Will see as indicated and progress as tolerated.      Follow Up Recommendations Home health PT;Supervision for mobility/OOB    Equipment Recommendations  None recommended by PT    Recommendations for Other Services       Precautions / Restrictions Precautions Precautions: Fall Restrictions Weight Bearing Restrictions: No      Mobility  Bed Mobility Overal bed mobility: Needs Assistance Bed Mobility: Supine to Sit;Sit to Supine     Supine to sit: Supervision Sit to supine: Supervision   General bed mobility comments: increased time to perform, supervision for line management, no physical assist required  Transfers Overall transfer level: Needs assistance Equipment used: Straight cane Transfers: Sit to/from Stand Sit to Stand: Supervision         General transfer comment: No physical assist required, supervision for line management  Ambulation/Gait Ambulation/Gait assistance: Min guard Ambulation Distance (Feet): 210 Feet Assistive device: Straight cane Gait Pattern/deviations: Step-through pattern;Decreased stride length;Shuffle;Trunk flexed Gait velocity: decreased Gait velocity interpretation: Below normal speed for age/gender General Gait Details: slow cautious gait, good sequencing with use of cane, increased fatigue with limited distance.   Stairs            Wheelchair Mobility    Modified Rankin (Stroke  Patients Only)       Balance Overall balance assessment: Needs assistance Sitting-balance support: Feet supported Sitting balance-Leahy Scale: Good     Standing balance support: Single extremity supported Standing balance-Leahy Scale: Fair                               Pertinent Vitals/Pain Pain Assessment: 0-10 Pain Score: 6  Pain Location: pain from upper abdomen radiating to back  Pain Descriptors / Indicators: Aching;Discomfort;Grimacing;Guarding Pain Intervention(s): Monitored during session    Home Living Family/patient expects to be discharged to:: Private residence Living Arrangements: Alone   Type of Home: House Home Access: Stairs to enter Entrance Stairs-Rails: Right Entrance Stairs-Number of Steps: 2 Home Layout: One level Home Equipment: Environmental consultant - 2 wheels;Cane - single point      Prior Function Level of Independence: Independent with assistive device(s)         Comments: ocassional use of cane     Hand Dominance   Dominant Hand: Right    Extremity/Trunk Assessment   Upper Extremity Assessment Upper Extremity Assessment: Generalized weakness    Lower Extremity Assessment Lower Extremity Assessment: Generalized weakness       Communication   Communication: No difficulties  Cognition Arousal/Alertness: Awake/alert Behavior During Therapy: WFL for tasks assessed/performed Overall Cognitive Status: Within Functional Limits for tasks assessed                                        General Comments      Exercises     Assessment/Plan    PT Assessment Patient needs  continued PT services  PT Problem List Decreased strength;Decreased activity tolerance;Decreased balance;Decreased mobility;Pain       PT Treatment Interventions DME instruction;Gait training;Stair training;Functional mobility training;Therapeutic activities;Therapeutic exercise;Balance training;Patient/family education    PT Goals (Current  goals can be found in the Care Plan section)  Acute Rehab PT Goals Patient Stated Goal: to go home PT Goal Formulation: With patient Time For Goal Achievement: 03/08/17 Potential to Achieve Goals: Good    Frequency Min 3X/week   Barriers to discharge Decreased caregiver support      Co-evaluation               End of Session Equipment Utilized During Treatment: Gait belt Activity Tolerance: Patient tolerated treatment well;Patient limited by fatigue Patient left: in bed;with call bell/phone within reach;with bed alarm set Nurse Communication: Mobility status PT Visit Diagnosis: Unsteadiness on feet (R26.81)    Time: 4403-4742 PT Time Calculation (min) (ACUTE ONLY): 21 min   Charges:   PT Evaluation $PT Eval Moderate Complexity: 1 Procedure     PT G Codes:        Alben Deeds, PT DPT  [phone]   Duncan Dull 02/22/2017, 2:36 PM

## 2017-02-23 LAB — BASIC METABOLIC PANEL
ANION GAP: 10 (ref 5–15)
BUN: 10 mg/dL (ref 6–20)
CO2: 22 mmol/L (ref 22–32)
Calcium: 8.6 mg/dL — ABNORMAL LOW (ref 8.9–10.3)
Chloride: 97 mmol/L — ABNORMAL LOW (ref 101–111)
Creatinine, Ser: 1 mg/dL (ref 0.61–1.24)
GFR calc non Af Amer: >60 mL/min (ref >60)
Glucose, Bld: 116 mg/dL — ABNORMAL HIGH (ref 65–99)
POTASSIUM: 3.7 mmol/L (ref 3.5–5.1)
Sodium: 129 mmol/L — ABNORMAL LOW (ref 135–145)

## 2017-02-23 LAB — GLUCOSE, CAPILLARY
GLUCOSE-CAPILLARY: 126 mg/dL — AB (ref 65–99)
GLUCOSE-CAPILLARY: 115 mg/dL — AB (ref 65–99)

## 2017-02-23 MED ORDER — IBUPROFEN 600 MG PO TABS
600.0000 mg | ORAL_TABLET | Freq: Once | ORAL | Status: AC
  Administered 2017-02-23: 600 mg via ORAL
  Filled 2017-02-23: qty 1

## 2017-02-23 MED ORDER — POTASSIUM CHLORIDE CRYS ER 20 MEQ PO TBCR
40.0000 meq | EXTENDED_RELEASE_TABLET | Freq: Once | ORAL | Status: AC
  Administered 2017-02-23: 40 meq via ORAL
  Filled 2017-02-23: qty 2

## 2017-02-23 NOTE — Discharge Instructions (Signed)

## 2017-02-23 NOTE — Progress Notes (Signed)
Pt stable and discharged home via wheelchair. All discharge instructions given and reviewed with patient. IV and tele removed from pt. Belongings sent home with pt.

## 2017-02-23 NOTE — Discharge Summary (Addendum)
Physician Discharge Summary  ASAHD CAN IDP:[id number] DOB: [birth year]  PCP: [name], MD  Admit date: 02/18/2017 Discharge date: 02/23/2017  Recommendations for Outpatient Follow-up:  1. Dr. [name], PCP in 3 days with repeat labs (CBC, BMP, EKG). Please follow final blood culture results that percent from the hospital. 2. Recommend outpatient follow-up of lung abnormality seen on CT abdomen, as deemed necessary. 3. [facility]:  Office will arrange follow-up.  Home Health: PT Equipment/Devices: None    Discharge Condition: Improved and stable  CODE STATUS: Full  Diet recommendation: Heart healthy & diabetic diet.  Discharge Diagnoses:  Principal Problem:   Nausea vomiting and diarrhea Active Problems:   Coronary artery disease-nonobstructive cath 2012   Type 2 diabetes mellitus with hemoglobin A1c goal of less than 7.0% (HCC)   Elevated troponin   High anion gap metabolic acidosis   Brief Summary: 82 y/o male, [place] resident, PMH of CAD non occlusive by cath 2012, Complete Heart block, s/p dual chamber pacemaker, DM2, HTN, HLD, pressented to ED on 4/17 with h/o couple days of abdominal pain which got worse on day prior to admission, non bloody emesis and diarrhea. Symptoms worsened when he started driving from [facility] to go visit his ?35 y/o son who is at a Psychiatric institution in [place] since his son was 22 years old. He had to stop multiple times d/t his symptoms. CT abdomen, Lipase and LFT's unremarkable. Cardiology consulted for elevated troponin's, no need for further workup, signed off. Treated as traveler's diarrhea. Improved.   Assessment & Plan:   1. Abdominal pain/Nausea/Vomiting/Diarrhea (Possibly Traveler's diarrhea): C DIff testing negative. GI panel PCR positive for Enterotoxigenic E Coli. Blood Cultures: NTD. CT Abd & Pelvis with contrast: No acute findings. Due to persisting symptoms on 4/20, started patient on azithromycin 250 mg daily 3 days and completed  course. Slowly improved. ? Element of chronic pain> noted on opoid's at home and on Amitiza. 2. Elevated Troponin/CAD: Cardiology follow up appreciated. All in setting of acute illness. No significant CAD by cath 2012. No further work up planned. They will arrange OP follow up. 3. S/P PPM: Pacemaker interrogated and OK. 4. Hypokalemia: replaced. K 4.2 5. Hypomagnesemia: replaced. Mg 2 6. AKI: resolved. 7. DM 2: reasonable control on SSI while hospitalized. Continue Metformin at DC. 8. Lung abnormality on CT abdomen: " Mild reticulonodular/ tree-in-bud pattern of opacification over the lingula and left lower lobe which may be due to bacterial pneumonia versus mycobacterium. Recommend followup CT 6-8 weeks". No clinical PNA. 9. High AG acidosis: resolved 10. HTN: uncontrolled. 11. Prolonged QTC: Discussed with cardiology on 4/20 who reviewed with EP cardiology and okayed short course of azithromycin with f/u EKG. K & Mg OK. EKG 4/22: Atrial sensed ventricular paced rhythm and QTC of 552 ms. Reviewed EKG with EP Cardiology on call today who did not see any concerns and OK to DC. 12. Hyponatremia:? SIADH. Clinically euvolemic. Follow BMP in a couple of days as outpatient.   Consultants:  Cardiology   Procedures:  None  Discharge Instructions  Discharge Instructions    Call MD for:  difficulty breathing, headache or visual disturbances    Complete by:  As directed    Call MD for:  extreme fatigue    Complete by:  As directed    Call MD for:  persistant dizziness or light-headedness    Complete by:  As directed    Call MD for:  persistant nausea and vomiting    Complete  by:  As directed    Call MD for:  severe uncontrolled pain    Complete by:  As directed    Call MD for:  temperature >100.4    Complete by:  As directed    Diet - low sodium heart healthy    Complete by:  As directed    Diet Carb Modified    Complete by:  As directed    Increase activity slowly    Complete by:  As  directed        Medication List    STOP taking these medications   famotidine 20 MG tablet Commonly known as:  PEPCID     TAKE these medications   AMITIZA 24 MCG capsule Generic drug:  lubiprostone Take 24 mcg by mouth 2 (two) times daily.   aspirin 81 MG tablet Take 81 mg by mouth daily.   BLADDER 2.2 PO Take 2 tablets by mouth at bedtime.   clonazePAM 0.5 MG tablet Commonly known as:  KLONOPIN Take 1 mg by mouth 2 (two) times daily as needed for anxiety. For anxiety   CRANBERRY PO Take 2 tablets by mouth daily.   darifenacin 15 MG 24 hr tablet Commonly known as:  ENABLEX Take 15 mg by mouth daily.   guaiFENesin 600 MG 12 hr tablet Commonly known as:  MUCINEX Take 1,200 mg by mouth at bedtime.   metFORMIN 500 MG 24 hr tablet Commonly known as:  GLUCOPHAGE-XR Take 500 mg by mouth 2 (two) times daily.   omeprazole 40 MG capsule Commonly known as:  PRILOSEC Take 40 mg by mouth daily.   oxyCODONE 15 MG immediate release tablet Commonly known as:  ROXICODONE Take 15 mg by mouth 4 (four) times daily. Take every day per patient   PROSTATE PO Take 1 tablet by mouth 2 (two) times daily.   RAPAFLO 8 MG Caps capsule Generic drug:  silodosin take 1 capsule by mouth once daily WITH EVENING MEAL   rosuvastatin 20 MG tablet Commonly known as:  CRESTOR Take 10 mg by mouth daily.   SLEEP AID PO Take 2 tablets by mouth at bedtime.   tamsulosin 0.4 MG Caps capsule Commonly known as:  FLOMAX Take 0.4 mg by mouth daily.   vitamin C 500 MG tablet Commonly known as:  ASCORBIC ACID Take 500 mg by mouth daily.      Follow-up Information    [name], MD. Schedule an appointment as soon as possible for a visit in 3 day(s).   Specialty:  Internal Medicine Why:  To be seen with repeat labs (CBC & BMP). Contact information: [address] [place] [address] [phone]          No Known Allergies    Procedures/Studies: Ct Abdomen  Pelvis W Contrast  Result Date: 02/18/2017 CLINICAL DATA:  Abdominal pain and vomiting. EXAM: CT ABDOMEN AND PELVIS WITH CONTRAST TECHNIQUE: Multidetector CT imaging of the abdomen and pelvis was performed using the standard protocol following bolus administration of intravenous contrast. CONTRAST:  1105mL ISOVUE-300 IOPAMIDOL (ISOVUE-300) INJECTION 61% COMPARISON:  09/14/2007 FINDINGS: Lower chest: Lung bases demonstrate subtle reticulonodular/tree-in-bud opacification over the lingula and left lower lobe. Cardiac pacer wires present. Hepatobiliary: Liver, gallbladder and biliary tree are within normal. Pancreas: Within normal. Spleen: Within normal. Adrenals/Urinary Tract: Adrenal glands are normal. Kidneys are normal in size without hydronephrosis or nephrolithiasis. Ureters and bladder are normal. Stomach/Bowel: Stomach and small bowel are within normal. Appendix is normal. Mild diverticulosis of the colon.  Vascular/Lymphatic: Minimal calcified plaque involving the abdominal aorta and iliac arteries. No adenopathy. Reproductive: Within normal. Other: Small left inguinal hernia containing only mesenteric fat. No free peritoneal fluid or focal inflammatory change. Musculoskeletal: Left total hip arthroplasty. Degenerative change of the spine and right hip. IMPRESSION: No acute findings in the abdomen/pelvis. Mild reticulonodular/ tree-in-bud pattern of opacification over the lingula and left lower lobe which may be due to bacterial pneumonia versus mycobacterium. Recommend followup CT 6-8 weeks. Mild colonic diverticulosis. Small left inguinal hernia containing only mesenteric fat. Electronically Signed   By: Marin Olp M.D.   On: 02/18/2017 21:04   Dg Abd Acute W/chest  Result Date: 02/18/2017 CLINICAL DATA:  Epigastric abdominal pain. Chills. Nausea, vomiting and diarrhea. EXAM: DG ABDOMEN ACUTE W/ 1V CHEST COMPARISON:  None. FINDINGS: Bowel gas pattern is normal without evidence of ileus, obstruction or  free air. No worrisome calcifications or bone findings. Previous left hip replacement. One-view chest shows normal heart size. Pacemaker in place. Lungs are clear. No acute bone finding. IMPRESSION: Negative acute abdominal series. Electronically Signed   By: Nelson Chimes M.D.   On: 02/18/2017 20:34      Subjective: Continues to gradually improve. No vomiting. Eating a little more but does not like hospital food. No BM since Wednesday. Passing flatus. Occasional nausea. Denies abdominal pain except when he presses on his left upper abdomen with associated back pain. No dysuria or urinary frequency. States that overall compared to when he came in, feels 70% better. No chest pain, dyspnea or dizziness or lightheadedness.  Discharge Exam:  Vitals:   02/22/17 0602 02/22/17 1440 02/22/17 2112 02/23/17 0606  BP: (!) 145/90 121/79 (!) 160/84 (!) 168/90  Pulse: 92 (!) 103 96 95  Resp: 18 19 19 19   Temp: 98 F (36.7 C) 98.2 F (36.8 C) 98.2 F (36.8 C) 98.4 F (36.9 C)  TempSrc: Oral Oral    SpO2: 94% 95% 96% 99%  Weight:      Height:        General exam: Pleasant elderly male sitting comfortably in chair this morning and had just finished eating his breakfast. Respiratory system: Clear to auscultation. Respiratory effort normal. Cardiovascular system: S1 & S2 heard, RRR. No JVD, murmurs, rubs, gallops or clicks. No pedal edema. Tele: V Paced rhythm. Gastrointestinal system: Abdomen is nondistended, soft and non tender to palpation. No organomegaly or masses felt. Normal bowel sounds heard. Central nervous system: Alert and oriented. No focal neurological deficits. Extremities: Symmetric 5 x 5 power. Skin: No rashes, lesions or ulcers Psychiatry: Judgement and insight appear normal. Mood & affect appropriate.    The results of significant diagnostics from this hospitalization (including imaging, microbiology, ancillary and laboratory) are listed below for reference.      Microbiology: Recent Results (from the past 240 hour(s))  Gastrointestinal Panel by PCR , Stool     Status: Abnormal   Collection Time: 02/18/17 10:46 PM  Result Value Ref Range Status   Campylobacter species NOT DETECTED NOT DETECTED Final   Plesimonas shigelloides NOT DETECTED NOT DETECTED Final   Salmonella species NOT DETECTED NOT DETECTED Final   Yersinia enterocolitica NOT DETECTED NOT DETECTED Final   Vibrio species NOT DETECTED NOT DETECTED Final   Vibrio cholerae NOT DETECTED NOT DETECTED Final   Enteroaggregative E coli (EAEC) NOT DETECTED NOT DETECTED Final   Enteropathogenic E coli (EPEC) NOT DETECTED NOT DETECTED Final   Enterotoxigenic E coli (ETEC) DETECTED (A) NOT DETECTED Final    Comment:  RESULT CALLED TO, READ BACK BY AND VERIFIED WITH: IRETI OMOSMBI AT 1614 ON 02/20/2017 JJB    Shiga like toxin producing E coli (STEC) NOT DETECTED NOT DETECTED Final   Shigella/Enteroinvasive E coli (EIEC) NOT DETECTED NOT DETECTED Final   Cryptosporidium NOT DETECTED NOT DETECTED Final   Cyclospora cayetanensis NOT DETECTED NOT DETECTED Final   Entamoeba histolytica NOT DETECTED NOT DETECTED Final   Giardia lamblia NOT DETECTED NOT DETECTED Final   Adenovirus F40/41 NOT DETECTED NOT DETECTED Final   Astrovirus NOT DETECTED NOT DETECTED Final   Norovirus GI/GII NOT DETECTED NOT DETECTED Final   Rotavirus A NOT DETECTED NOT DETECTED Final   Sapovirus (I, II, IV, and V) NOT DETECTED NOT DETECTED Final  Blood culture (routine x 2)     Status: None (Preliminary result)   Collection Time: 02/18/17 10:56 PM  Result Value Ref Range Status   Specimen Description BLOOD RIGHT HAND  Final   Special Requests IN PEDIATRIC BOTTLE Blood Culture adequate volume  Final   Culture NO GROWTH 4 DAYS  Final   Report Status PENDING  Incomplete  Blood culture (routine x 2)     Status: None (Preliminary result)   Collection Time: 02/18/17 11:18 PM  Result Value Ref Range Status   Specimen  Description BLOOD RIGHT ARM  Final   Special Requests IN PEDIATRIC BOTTLE Blood Culture adequate volume  Final   Culture NO GROWTH 4 DAYS  Final   Report Status PENDING  Incomplete  C difficile quick scan w PCR reflex     Status: None   Collection Time: 02/19/17  7:16 PM  Result Value Ref Range Status   C Diff antigen NEGATIVE NEGATIVE Final   C Diff toxin NEGATIVE NEGATIVE Final   C Diff interpretation No C. difficile detected.  Final     Labs: CBC:  Recent Labs Lab 02/18/17 1757 02/19/17 0459 02/20/17 0355 02/21/17 0408  WBC 12.2* 13.7* 11.0* 13.1*  HGB 17.7* 14.9 15.5 16.5  HCT 50.0 43.7 44.5 46.4  MCV 92.3 93.4 92.1 90.4  PLT 156 157 163 831   Basic Metabolic Panel:  Recent Labs Lab 02/19/17 0459 02/20/17 0355 02/20/17 1000 02/21/17 0408 02/21/17 1600 02/22/17 0658 02/23/17 0731  NA 139 136  --  134*  --  130* 129*  K 3.1* 2.9*  --  3.7  --  4.2 3.7  CL 103 99*  --  96*  --  96* 97*  CO2 23 26  --  23  --  23 22  GLUCOSE 146* 131*  --  126*  --  123* 116*  BUN 11 5*  --  8  --  11 10  CREATININE 1.15 1.03  --  1.04  --  1.03 1.00  CALCIUM 8.6* 8.9  --  9.6  --  8.7* 8.6*  MG  --   --  1.5*  --  1.7 2.0  --    Liver Function Tests:  Recent Labs Lab 02/18/17 1757  AST 48*  ALT 23  ALKPHOS 81  BILITOT 0.6  PROT 8.9*  ALBUMIN 4.5   Cardiac Enzymes:  Recent Labs Lab 02/18/17 2315 02/19/17 0459 02/19/17 1014  TROPONINI 0.03* 0.05* 0.05*   CBG:  Recent Labs Lab 02/22/17 1224 02/22/17 1725 02/22/17 2051 02/23/17 0809 02/23/17 1245  GLUCAP 123* 124* 137* 126* 115*   Urinalysis    Component Value Date/Time   COLORURINE YELLOW 02/18/2017 2105   APPEARANCEUR CLEAR 02/18/2017 2105   LABSPEC 1.025  02/18/2017 2105   PHURINE 6.0 02/18/2017 2105   GLUCOSEU NEGATIVE 02/18/2017 2105   GLUCOSEU NEG mg/dL 04/04/2008 2022   HGBUR NEGATIVE 02/18/2017 2105   BILIRUBINUR NEGATIVE 02/18/2017 2105   KETONESUR 15 (A) 02/18/2017 2105   PROTEINUR 100  (A) 02/18/2017 2105   UROBILINOGEN 0.2 09/18/2015 1835   NITRITE NEGATIVE 02/18/2017 2105   LEUKOCYTESUR NEGATIVE 02/18/2017 2105    Discussed with patient's grandson in town, updated care and answered questions.  Time coordinating discharge: Over 30 minutes  SIGNED:  Vernell Leep, MD, FACP, [place]. [facility] Pager [phone]  If 7PM-7AM, please contact night-coverage [email or web address] Password [facility] 02/23/2017, 2:55 PM

## 2017-02-24 LAB — CULTURE, BLOOD (ROUTINE X 2)
CULTURE: NO GROWTH
Culture: NO GROWTH
Special Requests: ADEQUATE
Special Requests: ADEQUATE

## 2017-02-28 DIAGNOSIS — E876 Hypokalemia: Secondary | ICD-10-CM | POA: Diagnosis not present

## 2017-02-28 DIAGNOSIS — E119 Type 2 diabetes mellitus without complications: Secondary | ICD-10-CM | POA: Diagnosis not present

## 2017-02-28 DIAGNOSIS — Z09 Encounter for follow-up examination after completed treatment for conditions other than malignant neoplasm: Secondary | ICD-10-CM | POA: Diagnosis not present

## 2017-02-28 DIAGNOSIS — A041 Enterotoxigenic Escherichia coli infection: Secondary | ICD-10-CM | POA: Diagnosis not present

## 2017-02-28 DIAGNOSIS — Z950 Presence of cardiac pacemaker: Secondary | ICD-10-CM | POA: Diagnosis not present

## 2017-03-07 ENCOUNTER — Telehealth: Payer: Self-pay

## 2017-03-07 DIAGNOSIS — N401 Enlarged prostate with lower urinary tract symptoms: Secondary | ICD-10-CM | POA: Diagnosis not present

## 2017-03-07 NOTE — Telephone Encounter (Signed)
Dr.Skains DOD reviewed EKG done at [facility] with  Annalee Genta NP on 02/28/17.He advised no changes seen.

## 2017-03-12 DIAGNOSIS — E876 Hypokalemia: Secondary | ICD-10-CM | POA: Diagnosis not present

## 2017-03-12 DIAGNOSIS — R410 Disorientation, unspecified: Secondary | ICD-10-CM | POA: Diagnosis not present

## 2017-03-12 DIAGNOSIS — R630 Anorexia: Secondary | ICD-10-CM | POA: Diagnosis not present

## 2017-03-14 DIAGNOSIS — R410 Disorientation, unspecified: Secondary | ICD-10-CM | POA: Diagnosis not present

## 2017-03-14 DIAGNOSIS — R197 Diarrhea, unspecified: Secondary | ICD-10-CM | POA: Diagnosis not present

## 2017-03-14 DIAGNOSIS — E119 Type 2 diabetes mellitus without complications: Secondary | ICD-10-CM | POA: Diagnosis not present

## 2017-03-14 DIAGNOSIS — R351 Nocturia: Secondary | ICD-10-CM | POA: Diagnosis not present

## 2017-03-14 DIAGNOSIS — R609 Edema, unspecified: Secondary | ICD-10-CM | POA: Diagnosis not present

## 2017-03-14 DIAGNOSIS — R918 Other nonspecific abnormal finding of lung field: Secondary | ICD-10-CM | POA: Diagnosis not present

## 2017-03-14 DIAGNOSIS — E876 Hypokalemia: Secondary | ICD-10-CM | POA: Diagnosis not present

## 2017-03-14 DIAGNOSIS — I25119 Atherosclerotic heart disease of native coronary artery with unspecified angina pectoris: Secondary | ICD-10-CM | POA: Diagnosis not present

## 2017-03-14 DIAGNOSIS — M1990 Unspecified osteoarthritis, unspecified site: Secondary | ICD-10-CM | POA: Diagnosis not present

## 2017-03-19 DIAGNOSIS — Z950 Presence of cardiac pacemaker: Secondary | ICD-10-CM | POA: Diagnosis not present

## 2017-03-19 DIAGNOSIS — R4182 Altered mental status, unspecified: Secondary | ICD-10-CM | POA: Diagnosis not present

## 2017-03-19 DIAGNOSIS — I517 Cardiomegaly: Secondary | ICD-10-CM | POA: Diagnosis not present

## 2017-03-19 DIAGNOSIS — I10 Essential (primary) hypertension: Secondary | ICD-10-CM | POA: Diagnosis not present

## 2017-03-19 DIAGNOSIS — R4189 Other symptoms and signs involving cognitive functions and awareness: Secondary | ICD-10-CM | POA: Diagnosis present

## 2017-03-19 DIAGNOSIS — R278 Other lack of coordination: Secondary | ICD-10-CM | POA: Diagnosis not present

## 2017-03-19 DIAGNOSIS — I959 Hypotension, unspecified: Secondary | ICD-10-CM | POA: Diagnosis not present

## 2017-03-19 DIAGNOSIS — R262 Difficulty in walking, not elsewhere classified: Secondary | ICD-10-CM | POA: Diagnosis not present

## 2017-03-19 DIAGNOSIS — E876 Hypokalemia: Secondary | ICD-10-CM | POA: Diagnosis present

## 2017-03-19 DIAGNOSIS — I429 Cardiomyopathy, unspecified: Secondary | ICD-10-CM | POA: Diagnosis not present

## 2017-03-19 DIAGNOSIS — F0391 Unspecified dementia with behavioral disturbance: Secondary | ICD-10-CM | POA: Diagnosis not present

## 2017-03-19 DIAGNOSIS — I2510 Atherosclerotic heart disease of native coronary artery without angina pectoris: Secondary | ICD-10-CM | POA: Diagnosis present

## 2017-03-19 DIAGNOSIS — E860 Dehydration: Secondary | ICD-10-CM | POA: Diagnosis present

## 2017-03-19 DIAGNOSIS — R404 Transient alteration of awareness: Secondary | ICD-10-CM | POA: Diagnosis not present

## 2017-03-19 DIAGNOSIS — G9349 Other encephalopathy: Secondary | ICD-10-CM | POA: Diagnosis not present

## 2017-03-19 DIAGNOSIS — R41841 Cognitive communication deficit: Secondary | ICD-10-CM | POA: Diagnosis not present

## 2017-03-19 DIAGNOSIS — G40309 Generalized idiopathic epilepsy and epileptic syndromes, not intractable, without status epilepticus: Secondary | ICD-10-CM | POA: Diagnosis not present

## 2017-03-19 DIAGNOSIS — I5022 Chronic systolic (congestive) heart failure: Secondary | ICD-10-CM | POA: Diagnosis present

## 2017-03-19 DIAGNOSIS — M6281 Muscle weakness (generalized): Secondary | ICD-10-CM | POA: Diagnosis not present

## 2017-03-19 DIAGNOSIS — I110 Hypertensive heart disease with heart failure: Secondary | ICD-10-CM | POA: Diagnosis present

## 2017-03-19 DIAGNOSIS — F061 Catatonic disorder due to known physiological condition: Secondary | ICD-10-CM | POA: Diagnosis not present

## 2017-03-19 DIAGNOSIS — G3184 Mild cognitive impairment, so stated: Secondary | ICD-10-CM | POA: Diagnosis not present

## 2017-03-19 DIAGNOSIS — R401 Stupor: Secondary | ICD-10-CM | POA: Diagnosis not present

## 2017-03-19 DIAGNOSIS — F419 Anxiety disorder, unspecified: Secondary | ICD-10-CM | POA: Diagnosis present

## 2017-03-19 DIAGNOSIS — I639 Cerebral infarction, unspecified: Secondary | ICD-10-CM | POA: Diagnosis not present

## 2017-03-19 DIAGNOSIS — I351 Nonrheumatic aortic (valve) insufficiency: Secondary | ICD-10-CM | POA: Diagnosis not present

## 2017-03-19 DIAGNOSIS — F0390 Unspecified dementia without behavioral disturbance: Secondary | ICD-10-CM | POA: Diagnosis present

## 2017-03-19 DIAGNOSIS — M6259 Muscle wasting and atrophy, not elsewhere classified, multiple sites: Secondary | ICD-10-CM | POA: Diagnosis not present

## 2017-03-19 DIAGNOSIS — E119 Type 2 diabetes mellitus without complications: Secondary | ICD-10-CM | POA: Diagnosis present

## 2017-03-19 DIAGNOSIS — F329 Major depressive disorder, single episode, unspecified: Secondary | ICD-10-CM | POA: Diagnosis not present

## 2017-03-19 DIAGNOSIS — I361 Nonrheumatic tricuspid (valve) insufficiency: Secondary | ICD-10-CM | POA: Diagnosis not present

## 2017-03-19 DIAGNOSIS — R419 Unspecified symptoms and signs involving cognitive functions and awareness: Secondary | ICD-10-CM | POA: Diagnosis not present

## 2017-03-19 DIAGNOSIS — G9340 Encephalopathy, unspecified: Secondary | ICD-10-CM | POA: Diagnosis present

## 2017-03-19 DIAGNOSIS — G301 Alzheimer's disease with late onset: Secondary | ICD-10-CM | POA: Diagnosis not present

## 2017-03-19 DIAGNOSIS — F29 Unspecified psychosis not due to a substance or known physiological condition: Secondary | ICD-10-CM | POA: Diagnosis present

## 2017-03-19 DIAGNOSIS — R1312 Dysphagia, oropharyngeal phase: Secondary | ICD-10-CM | POA: Diagnosis not present

## 2017-03-25 DIAGNOSIS — R079 Chest pain, unspecified: Secondary | ICD-10-CM | POA: Diagnosis not present

## 2017-03-25 DIAGNOSIS — I5022 Chronic systolic (congestive) heart failure: Secondary | ICD-10-CM | POA: Diagnosis not present

## 2017-03-25 DIAGNOSIS — R278 Other lack of coordination: Secondary | ICD-10-CM | POA: Diagnosis not present

## 2017-03-25 DIAGNOSIS — R1312 Dysphagia, oropharyngeal phase: Secondary | ICD-10-CM | POA: Diagnosis not present

## 2017-03-25 DIAGNOSIS — M6281 Muscle weakness (generalized): Secondary | ICD-10-CM | POA: Diagnosis not present

## 2017-03-25 DIAGNOSIS — K219 Gastro-esophageal reflux disease without esophagitis: Secondary | ICD-10-CM | POA: Diagnosis not present

## 2017-03-25 DIAGNOSIS — G9340 Encephalopathy, unspecified: Secondary | ICD-10-CM | POA: Diagnosis not present

## 2017-03-25 DIAGNOSIS — I2510 Atherosclerotic heart disease of native coronary artery without angina pectoris: Secondary | ICD-10-CM | POA: Diagnosis not present

## 2017-03-25 DIAGNOSIS — R0789 Other chest pain: Secondary | ICD-10-CM | POA: Diagnosis not present

## 2017-03-25 DIAGNOSIS — R41841 Cognitive communication deficit: Secondary | ICD-10-CM | POA: Diagnosis not present

## 2017-03-25 DIAGNOSIS — R404 Transient alteration of awareness: Secondary | ICD-10-CM | POA: Diagnosis not present

## 2017-03-25 DIAGNOSIS — E119 Type 2 diabetes mellitus without complications: Secondary | ICD-10-CM | POA: Diagnosis not present

## 2017-03-25 DIAGNOSIS — E785 Hyperlipidemia, unspecified: Secondary | ICD-10-CM | POA: Diagnosis not present

## 2017-03-25 DIAGNOSIS — F0391 Unspecified dementia with behavioral disturbance: Secondary | ICD-10-CM | POA: Diagnosis not present

## 2017-03-25 DIAGNOSIS — I959 Hypotension, unspecified: Secondary | ICD-10-CM | POA: Diagnosis not present

## 2017-03-25 DIAGNOSIS — F329 Major depressive disorder, single episode, unspecified: Secondary | ICD-10-CM | POA: Diagnosis not present

## 2017-03-25 DIAGNOSIS — G9349 Other encephalopathy: Secondary | ICD-10-CM | POA: Diagnosis not present

## 2017-03-25 DIAGNOSIS — M6259 Muscle wasting and atrophy, not elsewhere classified, multiple sites: Secondary | ICD-10-CM | POA: Diagnosis not present

## 2017-03-25 DIAGNOSIS — I10 Essential (primary) hypertension: Secondary | ICD-10-CM | POA: Diagnosis not present

## 2017-03-25 DIAGNOSIS — R262 Difficulty in walking, not elsewhere classified: Secondary | ICD-10-CM | POA: Diagnosis not present

## 2017-03-27 DIAGNOSIS — R079 Chest pain, unspecified: Secondary | ICD-10-CM | POA: Diagnosis not present

## 2017-03-27 DIAGNOSIS — R0789 Other chest pain: Secondary | ICD-10-CM | POA: Diagnosis not present

## 2017-03-28 DIAGNOSIS — I2510 Atherosclerotic heart disease of native coronary artery without angina pectoris: Secondary | ICD-10-CM | POA: Diagnosis not present

## 2017-03-28 DIAGNOSIS — E785 Hyperlipidemia, unspecified: Secondary | ICD-10-CM | POA: Diagnosis not present

## 2017-03-28 DIAGNOSIS — G9340 Encephalopathy, unspecified: Secondary | ICD-10-CM | POA: Diagnosis not present

## 2017-03-28 DIAGNOSIS — I10 Essential (primary) hypertension: Secondary | ICD-10-CM | POA: Diagnosis not present

## 2017-03-28 DIAGNOSIS — E119 Type 2 diabetes mellitus without complications: Secondary | ICD-10-CM | POA: Diagnosis not present

## 2017-03-28 DIAGNOSIS — F329 Major depressive disorder, single episode, unspecified: Secondary | ICD-10-CM | POA: Diagnosis not present

## 2017-03-28 DIAGNOSIS — K219 Gastro-esophageal reflux disease without esophagitis: Secondary | ICD-10-CM | POA: Diagnosis not present

## 2017-04-07 NOTE — Progress Notes (Deleted)
Cardiology Office Note Date:  04/07/2017  Patient ID:  Antonio Le, DOB November 10, 1934, MRN [id number] PCP:  [name], MD  Electrophysiologist: Dr. [name] Comes  ***refresh   Chief Complaint: ***  History of Present Illness: KAMILO OCH is a 82 y.o. male with history of sinus node dysfunction,, CHB is also mentioned in his chart w/PPM, reported cath with NOD in 2012, HTN, HLD, hx of syncope suspect to have been neurally mediataed with normal pacer function and no observations by the device at the time.  She was most recently admitted to [facility] with GI c/o N/V/D, during her stay initially had ISat trop 0.27 though l;ab Trop were max 0.03, cardiology was consulted and without anginal symptoms in the environment of an acute illness without further w/u was recommended.  She comes in today to be seen for Dr. [name] Comes, last seen by him in April last year, at that time doing well, no changes were made.   *** symptoms *** meds *** labs/lipids?   Past Medical History:  Diagnosis Date  . Bradycardia    Dr. Harrington Challenger is cardiologist, holter monitor (36-128 bpm), now has mri compatible pacemaker f/b dr. [name] Comes  . CAD (coronary artery disease)    cath 01/12 no CAD, no records of reported silent MI in [facility], in [place], normal [place] 2007, EF 40%, mild diastolic dysfunction  . Cervical spondylarthritis   . Chronic constipation    GI referral to Dr. Oletta Lamas in the past, pt did not go  . GERD (gastroesophageal reflux disease)   . Glaucoma, open angle   . H. pylori infection    PUD association  . History of alcohol abuse   . Hyperlipidemia   . Hypertension   . Idiopathic acute pancreatitis   . 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 Renal insufficiency    creatinine ~1.2 in the past  . Syncopal episodes   . Tinea cruris     Past Surgical History:  Procedure Laterality Date  . CATARACT EXTRACTION    . left hip replacement    . PACEMAKER INSERTION     Revo Sure Scan  pacemaker    Current Outpatient Prescriptions  Medication Sig Dispense Refill  . AMITIZA 24 MCG capsule Take 24 mcg by mouth 2 (two) times daily.   0  . aspirin 81 MG tablet Take 81 mg by mouth daily.     . clonazePAM (KLONOPIN) 0.5 MG tablet Take 1 mg by mouth 2 (two) times daily as needed for anxiety. For anxiety    . CRANBERRY PO Take 2 tablets by mouth daily.    Marland Kitchen darifenacin (ENABLEX) 15 MG 24 hr tablet Take 15 mg by mouth daily.    . Doxylamine Succinate, Sleep, (SLEEP AID PO) Take 2 tablets by mouth at bedtime.    Marland Kitchen guaiFENesin (MUCINEX) 600 MG 12 hr tablet Take 1,200 mg by mouth at bedtime.    . metFORMIN (GLUCOPHAGE-XR) 500 MG 24 hr tablet Take 500 mg by mouth 2 (two) times daily.     . Nutritional Supplements (BLADDER 2.2 PO) Take 2 tablets by mouth at bedtime.    Marland Kitchen omeprazole (PRILOSEC) 40 MG capsule Take 40 mg by mouth daily.     Marland Kitchen oxyCODONE (ROXICODONE) 15 MG immediate release tablet Take 15 mg by mouth 4 (four) times daily. Take every day per patient  0  . RAPAFLO 8 MG CAPS capsule take 1 capsule by mouth once daily WITH EVENING MEAL  1  . rosuvastatin (CRESTOR) 20 MG tablet Take 10 mg by mouth daily.     Marland Kitchen Specialty Vitamins Products (PROSTATE PO) Take 1 tablet by mouth 2 (two) times daily.    . tamsulosin (FLOMAX) 0.4 MG CAPS capsule Take 0.4 mg by mouth daily.   0  . vitamin C (ASCORBIC ACID) 500 MG tablet Take 500 mg by mouth daily.     No current facility-administered medications for this visit.     Allergies:   Patient has no known allergies.   Social History:  The patient  reports that he has never smoked. He has never used smokeless tobacco. He reports that he does not drink alcohol or use drugs.   Family History:  The patient's family history includes Diabetes in his mother.  ROS:  Please see the history of present illness.  All other systems are reviewed and otherwise negative.   PHYSICAL EXAM: *** VS:  There were no vitals taken for this visit. BMI: There  is no height or weight on file to calculate BMI. Well nourished, well developed, in no acute distress  HEENT: normocephalic, atraumatic  Neck: no JVD, carotid bruits or masses Cardiac:  *** RRR; no significant murmurs, no rubs, or gallops Lungs:  *** CTA b/l, no wheezing, rhonchi or rales  Abd: soft, nontender MS: no deformity or atrophy Ext: *** no edema  Skin: warm and dry, no rash Neuro:  No gross deficits appreciated Psych: euthymic mood, full affect  *** PPM site is stable, no tethering or discomfort   EKG:  Done 02/23/17 SR, V paced PPM interrogation done today and reviewed by myself: ***  10/31/15: TTE Study Conclusions - Left ventricle: The cavity size was normal. Wall thickness was   increased in a pattern of mild LVH. Systolic function was normal.   The estimated ejection fraction was in the range of 55% to 60%.   Wall motion was normal; there were no regional wall motion   abnormalities. Doppler parameters are consistent with abnormal   left ventricular relaxation (grade 1 diastolic dysfunction). Impressions: - Technically difficult; definity used; normal LV systolic   function; grade 1 diastolic dysfunction; prominent apical   hypertrophy; cannot R/O apical hypertrophic cardiomyopathy    Recent Labs: 02/18/2017: ALT 23 02/21/2017: Hemoglobin 16.5; Platelets 162 02/22/2017: Magnesium 2.0 02/23/2017: BUN 10; Creatinine, Ser 1.00; Potassium 3.7; Sodium 129  No results found for requested labs within last 8760 hours.   CrCl cannot be calculated (Patient's most recent lab result is older than the maximum 21 days allowed.).   Wt Readings from Last 3 Encounters:  02/18/17 182 lb (82.6 kg)  05/16/16 171 lb (77.6 kg)  02/16/16 182 lb (82.6 kg)     Other studies reviewed: Additional studies/records reviewed today include: summarized above  ASSESSMENT AND PLAN:  1. PPM     ***  2. HTN     ***  3. HLD     ***   Disposition: F/u with ***  Current medicines  are reviewed at length with the patient today.  The patient did not have any concerns regarding medicines.***  Signed, Jennings Books, PA-C 04/07/2017 7:24 PM     [address] [place] [address] [phone] (office)  [phone] (fax)

## 2017-04-08 ENCOUNTER — Encounter: Payer: Medicare Other | Admitting: Physician Assistant

## 2017-04-10 ENCOUNTER — Encounter: Payer: Self-pay | Admitting: Physician Assistant

## 2017-04-21 DIAGNOSIS — K219 Gastro-esophageal reflux disease without esophagitis: Secondary | ICD-10-CM | POA: Diagnosis not present

## 2017-04-21 DIAGNOSIS — F331 Major depressive disorder, recurrent, moderate: Secondary | ICD-10-CM | POA: Diagnosis not present

## 2017-04-21 DIAGNOSIS — Z6826 Body mass index (BMI) 26.0-26.9, adult: Secondary | ICD-10-CM | POA: Diagnosis not present

## 2017-04-21 DIAGNOSIS — I10 Essential (primary) hypertension: Secondary | ICD-10-CM | POA: Diagnosis not present

## 2017-04-21 DIAGNOSIS — E7800 Pure hypercholesterolemia, unspecified: Secondary | ICD-10-CM | POA: Diagnosis not present

## 2017-04-21 DIAGNOSIS — N401 Enlarged prostate with lower urinary tract symptoms: Secondary | ICD-10-CM | POA: Diagnosis not present

## 2017-04-21 DIAGNOSIS — E119 Type 2 diabetes mellitus without complications: Secondary | ICD-10-CM | POA: Diagnosis not present

## 2017-05-05 DIAGNOSIS — E7800 Pure hypercholesterolemia, unspecified: Secondary | ICD-10-CM | POA: Diagnosis not present

## 2017-05-05 DIAGNOSIS — K219 Gastro-esophageal reflux disease without esophagitis: Secondary | ICD-10-CM | POA: Diagnosis not present

## 2017-05-05 DIAGNOSIS — E119 Type 2 diabetes mellitus without complications: Secondary | ICD-10-CM | POA: Diagnosis not present

## 2017-05-05 DIAGNOSIS — Z6826 Body mass index (BMI) 26.0-26.9, adult: Secondary | ICD-10-CM | POA: Diagnosis not present

## 2017-05-05 DIAGNOSIS — F331 Major depressive disorder, recurrent, moderate: Secondary | ICD-10-CM | POA: Diagnosis not present

## 2017-05-05 DIAGNOSIS — I10 Essential (primary) hypertension: Secondary | ICD-10-CM | POA: Diagnosis not present

## 2017-05-05 DIAGNOSIS — N401 Enlarged prostate with lower urinary tract symptoms: Secondary | ICD-10-CM | POA: Diagnosis not present

## 2017-05-19 DIAGNOSIS — R9431 Abnormal electrocardiogram [ECG] [EKG]: Secondary | ICD-10-CM | POA: Diagnosis not present

## 2017-05-19 DIAGNOSIS — E785 Hyperlipidemia, unspecified: Secondary | ICD-10-CM | POA: Diagnosis not present

## 2017-05-19 DIAGNOSIS — I10 Essential (primary) hypertension: Secondary | ICD-10-CM | POA: Diagnosis not present

## 2017-05-19 DIAGNOSIS — Z950 Presence of cardiac pacemaker: Secondary | ICD-10-CM | POA: Diagnosis not present

## 2017-05-19 DIAGNOSIS — I482 Chronic atrial fibrillation: Secondary | ICD-10-CM | POA: Diagnosis not present

## 2017-05-19 DIAGNOSIS — R072 Precordial pain: Secondary | ICD-10-CM | POA: Diagnosis not present

## 2017-05-19 DIAGNOSIS — I495 Sick sinus syndrome: Secondary | ICD-10-CM | POA: Diagnosis not present

## 2017-05-26 ENCOUNTER — Encounter: Payer: Medicare Other | Admitting: *Deleted

## 2017-05-30 ENCOUNTER — Other Ambulatory Visit: Payer: Self-pay

## 2017-05-30 ENCOUNTER — Encounter: Payer: Self-pay | Admitting: Cardiology

## 2017-06-09 DIAGNOSIS — I495 Sick sinus syndrome: Secondary | ICD-10-CM | POA: Diagnosis not present

## 2017-06-09 DIAGNOSIS — I10 Essential (primary) hypertension: Secondary | ICD-10-CM | POA: Diagnosis not present

## 2017-06-09 DIAGNOSIS — E1165 Type 2 diabetes mellitus with hyperglycemia: Secondary | ICD-10-CM | POA: Diagnosis not present

## 2017-06-09 DIAGNOSIS — R9431 Abnormal electrocardiogram [ECG] [EKG]: Secondary | ICD-10-CM | POA: Diagnosis not present

## 2017-06-09 DIAGNOSIS — Z950 Presence of cardiac pacemaker: Secondary | ICD-10-CM | POA: Diagnosis not present

## 2017-06-09 DIAGNOSIS — E785 Hyperlipidemia, unspecified: Secondary | ICD-10-CM | POA: Diagnosis not present

## 2017-06-16 DIAGNOSIS — I495 Sick sinus syndrome: Secondary | ICD-10-CM | POA: Diagnosis not present

## 2017-07-28 DIAGNOSIS — N401 Enlarged prostate with lower urinary tract symptoms: Secondary | ICD-10-CM | POA: Diagnosis not present

## 2017-07-28 DIAGNOSIS — Z1389 Encounter for screening for other disorder: Secondary | ICD-10-CM | POA: Diagnosis not present

## 2017-07-28 DIAGNOSIS — Z136 Encounter for screening for cardiovascular disorders: Secondary | ICD-10-CM | POA: Diagnosis not present

## 2017-07-28 DIAGNOSIS — N182 Chronic kidney disease, stage 2 (mild): Secondary | ICD-10-CM | POA: Diagnosis not present

## 2017-07-28 DIAGNOSIS — K219 Gastro-esophageal reflux disease without esophagitis: Secondary | ICD-10-CM | POA: Diagnosis not present

## 2017-07-28 DIAGNOSIS — Z23 Encounter for immunization: Secondary | ICD-10-CM | POA: Diagnosis not present

## 2017-07-28 DIAGNOSIS — Z0000 Encounter for general adult medical examination without abnormal findings: Secondary | ICD-10-CM | POA: Diagnosis not present

## 2017-07-28 DIAGNOSIS — E1122 Type 2 diabetes mellitus with diabetic chronic kidney disease: Secondary | ICD-10-CM | POA: Diagnosis not present

## 2017-07-28 DIAGNOSIS — Z0389 Encounter for observation for other suspected diseases and conditions ruled out: Secondary | ICD-10-CM | POA: Diagnosis not present

## 2017-07-28 DIAGNOSIS — F331 Major depressive disorder, recurrent, moderate: Secondary | ICD-10-CM | POA: Diagnosis not present

## 2017-07-28 DIAGNOSIS — E663 Overweight: Secondary | ICD-10-CM | POA: Diagnosis not present

## 2017-07-28 DIAGNOSIS — E7800 Pure hypercholesterolemia, unspecified: Secondary | ICD-10-CM | POA: Diagnosis not present

## 2017-07-28 DIAGNOSIS — I129 Hypertensive chronic kidney disease with stage 1 through stage 4 chronic kidney disease, or unspecified chronic kidney disease: Secondary | ICD-10-CM | POA: Diagnosis not present

## 2017-10-06 DIAGNOSIS — R609 Edema, unspecified: Secondary | ICD-10-CM | POA: Diagnosis not present

## 2017-10-06 DIAGNOSIS — J029 Acute pharyngitis, unspecified: Secondary | ICD-10-CM | POA: Diagnosis not present

## 2017-10-06 DIAGNOSIS — R351 Nocturia: Secondary | ICD-10-CM | POA: Diagnosis not present

## 2017-10-06 DIAGNOSIS — R197 Diarrhea, unspecified: Secondary | ICD-10-CM | POA: Diagnosis not present

## 2017-10-06 DIAGNOSIS — R918 Other nonspecific abnormal finding of lung field: Secondary | ICD-10-CM | POA: Diagnosis not present

## 2017-10-06 DIAGNOSIS — E119 Type 2 diabetes mellitus without complications: Secondary | ICD-10-CM | POA: Diagnosis not present

## 2017-10-06 DIAGNOSIS — G44209 Tension-type headache, unspecified, not intractable: Secondary | ICD-10-CM | POA: Diagnosis not present

## 2017-10-06 DIAGNOSIS — R410 Disorientation, unspecified: Secondary | ICD-10-CM | POA: Diagnosis not present

## 2017-10-06 DIAGNOSIS — E876 Hypokalemia: Secondary | ICD-10-CM | POA: Diagnosis not present

## 2017-10-06 DIAGNOSIS — M1990 Unspecified osteoarthritis, unspecified site: Secondary | ICD-10-CM | POA: Diagnosis not present

## 2017-10-06 DIAGNOSIS — I25119 Atherosclerotic heart disease of native coronary artery with unspecified angina pectoris: Secondary | ICD-10-CM | POA: Diagnosis not present

## 2017-10-06 DIAGNOSIS — I442 Atrioventricular block, complete: Secondary | ICD-10-CM | POA: Diagnosis not present

## 2017-10-13 DIAGNOSIS — M1990 Unspecified osteoarthritis, unspecified site: Secondary | ICD-10-CM | POA: Diagnosis not present

## 2017-10-13 DIAGNOSIS — E119 Type 2 diabetes mellitus without complications: Secondary | ICD-10-CM | POA: Diagnosis not present

## 2017-10-13 DIAGNOSIS — R351 Nocturia: Secondary | ICD-10-CM | POA: Diagnosis not present

## 2017-10-13 DIAGNOSIS — E785 Hyperlipidemia, unspecified: Secondary | ICD-10-CM | POA: Diagnosis not present

## 2017-10-13 DIAGNOSIS — J029 Acute pharyngitis, unspecified: Secondary | ICD-10-CM | POA: Diagnosis not present

## 2017-10-13 DIAGNOSIS — G44209 Tension-type headache, unspecified, not intractable: Secondary | ICD-10-CM | POA: Diagnosis not present

## 2017-10-13 DIAGNOSIS — R410 Disorientation, unspecified: Secondary | ICD-10-CM | POA: Diagnosis not present

## 2017-10-13 DIAGNOSIS — I25119 Atherosclerotic heart disease of native coronary artery with unspecified angina pectoris: Secondary | ICD-10-CM | POA: Diagnosis not present

## 2017-10-13 DIAGNOSIS — I442 Atrioventricular block, complete: Secondary | ICD-10-CM | POA: Diagnosis not present

## 2017-10-13 DIAGNOSIS — R197 Diarrhea, unspecified: Secondary | ICD-10-CM | POA: Diagnosis not present

## 2017-10-13 DIAGNOSIS — E876 Hypokalemia: Secondary | ICD-10-CM | POA: Diagnosis not present

## 2017-10-13 DIAGNOSIS — R918 Other nonspecific abnormal finding of lung field: Secondary | ICD-10-CM | POA: Diagnosis not present

## 2017-10-13 DIAGNOSIS — R609 Edema, unspecified: Secondary | ICD-10-CM | POA: Diagnosis not present
